# Patient Record
Sex: Female | Born: 1945 | Race: White | Hispanic: No | Marital: Married | Smoking: Former smoker
Health system: Southern US, Community
[De-identification: ages and names within clinical notes are randomized; demographics above are authoritative.]

## PROBLEM LIST (undated history)

## (undated) DIAGNOSIS — M797 Fibromyalgia: Secondary | ICD-10-CM

## (undated) DIAGNOSIS — I272 Pulmonary hypertension, unspecified: Secondary | ICD-10-CM

## (undated) DIAGNOSIS — N189 Chronic kidney disease, unspecified: Secondary | ICD-10-CM

## (undated) DIAGNOSIS — K219 Gastro-esophageal reflux disease without esophagitis: Secondary | ICD-10-CM

## (undated) DIAGNOSIS — C539 Malignant neoplasm of cervix uteri, unspecified: Secondary | ICD-10-CM

## (undated) DIAGNOSIS — M7070 Other bursitis of hip, unspecified hip: Secondary | ICD-10-CM

## (undated) DIAGNOSIS — F419 Anxiety disorder, unspecified: Secondary | ICD-10-CM

## (undated) DIAGNOSIS — F32A Depression, unspecified: Secondary | ICD-10-CM

## (undated) DIAGNOSIS — E7800 Pure hypercholesterolemia, unspecified: Secondary | ICD-10-CM

## (undated) DIAGNOSIS — F410 Panic disorder [episodic paroxysmal anxiety] without agoraphobia: Secondary | ICD-10-CM

## (undated) DIAGNOSIS — M8580 Other specified disorders of bone density and structure, unspecified site: Secondary | ICD-10-CM

## (undated) DIAGNOSIS — Z8719 Personal history of other diseases of the digestive system: Secondary | ICD-10-CM

## (undated) DIAGNOSIS — Z8619 Personal history of other infectious and parasitic diseases: Secondary | ICD-10-CM

## (undated) DIAGNOSIS — T7840XA Allergy, unspecified, initial encounter: Secondary | ICD-10-CM

## (undated) DIAGNOSIS — R911 Solitary pulmonary nodule: Secondary | ICD-10-CM

## (undated) DIAGNOSIS — F329 Major depressive disorder, single episode, unspecified: Secondary | ICD-10-CM

## (undated) DIAGNOSIS — R002 Palpitations: Secondary | ICD-10-CM

## (undated) DIAGNOSIS — I499 Cardiac arrhythmia, unspecified: Secondary | ICD-10-CM

## (undated) HISTORY — DX: Panic disorder (episodic paroxysmal anxiety): F41.0

## (undated) HISTORY — DX: Anxiety disorder, unspecified: F41.9

## (undated) HISTORY — DX: Pure hypercholesterolemia, unspecified: E78.00

## (undated) HISTORY — DX: Personal history of other infectious and parasitic diseases: Z86.19

## (undated) HISTORY — DX: Solitary pulmonary nodule: R91.1

## (undated) HISTORY — DX: Gastro-esophageal reflux disease without esophagitis: K21.9

## (undated) HISTORY — PX: CARDIAC CATHETERIZATION: SHX172

## (undated) HISTORY — DX: Pulmonary hypertension, unspecified: I27.20

## (undated) HISTORY — DX: Other specified disorders of bone density and structure, unspecified site: M85.80

## (undated) HISTORY — DX: Fibromyalgia: M79.7

## (undated) HISTORY — DX: Allergy, unspecified, initial encounter: T78.40XA

## (undated) HISTORY — DX: Malignant neoplasm of cervix uteri, unspecified: C53.9

## (undated) HISTORY — DX: Major depressive disorder, single episode, unspecified: F32.9

## (undated) HISTORY — DX: Other bursitis of hip, unspecified hip: M70.70

## (undated) HISTORY — DX: Palpitations: R00.2

## (undated) HISTORY — PX: THYROGLOSSAL DUCT CYST: SHX297

## (undated) HISTORY — DX: Depression, unspecified: F32.A

---

## 1976-12-08 DIAGNOSIS — C539 Malignant neoplasm of cervix uteri, unspecified: Secondary | ICD-10-CM

## 1976-12-08 HISTORY — PX: ABDOMINAL HYSTERECTOMY: SHX81

## 1976-12-08 HISTORY — DX: Malignant neoplasm of cervix uteri, unspecified: C53.9

## 2000-12-08 HISTORY — PX: COLONOSCOPY: SHX174

## 2003-12-09 HISTORY — PX: HAND SURGERY: SHX662

## 2005-12-08 HISTORY — PX: NASAL HEMORRHAGE CONTROL: SHX287

## 2006-12-08 HISTORY — PX: SHOULDER SURGERY: SHX246

## 2008-09-06 ENCOUNTER — Encounter: Payer: Self-pay | Admitting: Pulmonary Disease

## 2008-09-06 ENCOUNTER — Encounter: Payer: Self-pay | Admitting: Family Medicine

## 2008-09-25 ENCOUNTER — Encounter: Payer: Self-pay | Admitting: Pulmonary Disease

## 2009-02-22 ENCOUNTER — Ambulatory Visit: Payer: Self-pay | Admitting: Family Medicine

## 2009-02-22 DIAGNOSIS — E7800 Pure hypercholesterolemia, unspecified: Secondary | ICD-10-CM

## 2009-02-22 DIAGNOSIS — J309 Allergic rhinitis, unspecified: Secondary | ICD-10-CM | POA: Insufficient documentation

## 2009-02-22 DIAGNOSIS — M8580 Other specified disorders of bone density and structure, unspecified site: Secondary | ICD-10-CM

## 2009-02-22 DIAGNOSIS — N951 Menopausal and female climacteric states: Secondary | ICD-10-CM

## 2009-02-22 DIAGNOSIS — F4323 Adjustment disorder with mixed anxiety and depressed mood: Secondary | ICD-10-CM | POA: Insufficient documentation

## 2009-02-22 DIAGNOSIS — E559 Vitamin D deficiency, unspecified: Secondary | ICD-10-CM

## 2009-02-22 LAB — HM COLONOSCOPY

## 2009-03-08 LAB — CONVERTED CEMR LAB: Pap Smear: NORMAL

## 2009-03-15 ENCOUNTER — Ambulatory Visit: Payer: Self-pay | Admitting: Obstetrics & Gynecology

## 2009-03-15 ENCOUNTER — Encounter: Payer: Self-pay | Admitting: Obstetrics & Gynecology

## 2009-04-13 ENCOUNTER — Encounter: Admission: RE | Admit: 2009-04-13 | Discharge: 2009-04-13 | Payer: Self-pay | Admitting: Family Medicine

## 2009-04-17 ENCOUNTER — Encounter (INDEPENDENT_AMBULATORY_CARE_PROVIDER_SITE_OTHER): Payer: Self-pay | Admitting: *Deleted

## 2009-04-20 ENCOUNTER — Ambulatory Visit: Payer: Self-pay | Admitting: Family Medicine

## 2009-04-24 LAB — CONVERTED CEMR LAB
ALT: 21 units/L (ref 0–35)
AST: 26 units/L (ref 0–37)
CO2: 31 meq/L (ref 19–32)
Chloride: 107 meq/L (ref 96–112)
Creatinine, Ser: 0.9 mg/dL (ref 0.4–1.2)
Potassium: 4.5 meq/L (ref 3.5–5.1)
Sodium: 142 meq/L (ref 135–145)
Total CHOL/HDL Ratio: 3
Vit D, 25-Hydroxy: 42 ng/mL (ref 30–89)

## 2009-06-25 ENCOUNTER — Ambulatory Visit: Payer: Self-pay | Admitting: Family Medicine

## 2009-08-16 ENCOUNTER — Encounter: Payer: Self-pay | Admitting: Family Medicine

## 2009-08-17 ENCOUNTER — Ambulatory Visit: Payer: Self-pay | Admitting: Family Medicine

## 2009-08-17 DIAGNOSIS — R002 Palpitations: Secondary | ICD-10-CM

## 2009-08-21 ENCOUNTER — Ambulatory Visit: Payer: Self-pay | Admitting: Cardiology

## 2009-08-23 LAB — CONVERTED CEMR LAB: TSH: 1.131 microintl units/mL (ref 0.350–4.500)

## 2009-08-28 ENCOUNTER — Telehealth: Payer: Self-pay | Admitting: Family Medicine

## 2009-09-04 ENCOUNTER — Encounter: Payer: Self-pay | Admitting: Cardiology

## 2009-09-04 ENCOUNTER — Ambulatory Visit: Payer: Self-pay

## 2009-09-14 ENCOUNTER — Ambulatory Visit: Payer: Self-pay | Admitting: Pulmonary Disease

## 2009-09-14 DIAGNOSIS — J984 Other disorders of lung: Secondary | ICD-10-CM

## 2009-09-18 ENCOUNTER — Ambulatory Visit: Payer: Self-pay | Admitting: Cardiology

## 2009-09-20 ENCOUNTER — Telehealth: Payer: Self-pay | Admitting: Family Medicine

## 2009-10-18 ENCOUNTER — Telehealth (INDEPENDENT_AMBULATORY_CARE_PROVIDER_SITE_OTHER): Payer: Self-pay | Admitting: Internal Medicine

## 2009-10-18 ENCOUNTER — Ambulatory Visit: Payer: Self-pay | Admitting: Family Medicine

## 2010-03-04 ENCOUNTER — Telehealth: Payer: Self-pay | Admitting: Pulmonary Disease

## 2010-03-12 ENCOUNTER — Ambulatory Visit: Payer: Self-pay | Admitting: Internal Medicine

## 2010-03-15 ENCOUNTER — Telehealth (INDEPENDENT_AMBULATORY_CARE_PROVIDER_SITE_OTHER): Payer: Self-pay | Admitting: *Deleted

## 2010-04-03 ENCOUNTER — Telehealth: Payer: Self-pay | Admitting: Family Medicine

## 2010-04-04 ENCOUNTER — Telehealth: Payer: Self-pay | Admitting: Family Medicine

## 2010-04-22 ENCOUNTER — Ambulatory Visit: Payer: Self-pay | Admitting: Family Medicine

## 2010-04-22 DIAGNOSIS — R5383 Other fatigue: Secondary | ICD-10-CM

## 2010-04-22 DIAGNOSIS — R21 Rash and other nonspecific skin eruption: Secondary | ICD-10-CM | POA: Insufficient documentation

## 2010-04-22 DIAGNOSIS — R5381 Other malaise: Secondary | ICD-10-CM

## 2010-04-23 LAB — CONVERTED CEMR LAB
ALT: 16 units/L (ref 0–35)
AST: 20 units/L (ref 0–37)
VLDL: 15.4 mg/dL (ref 0.0–40.0)
Vit D, 25-Hydroxy: 41 ng/mL (ref 30–89)

## 2010-05-02 ENCOUNTER — Ambulatory Visit: Payer: Self-pay | Admitting: Obstetrics & Gynecology

## 2010-05-29 ENCOUNTER — Encounter: Admission: RE | Admit: 2010-05-29 | Discharge: 2010-05-29 | Payer: Self-pay | Admitting: Obstetrics & Gynecology

## 2010-05-29 LAB — HM MAMMOGRAPHY: HM Mammogram: NEGATIVE

## 2010-07-03 ENCOUNTER — Telehealth: Payer: Self-pay | Admitting: Family Medicine

## 2010-07-30 ENCOUNTER — Telehealth: Payer: Self-pay | Admitting: Family Medicine

## 2010-09-04 ENCOUNTER — Ambulatory Visit: Payer: Self-pay | Admitting: Family Medicine

## 2010-09-04 DIAGNOSIS — M5412 Radiculopathy, cervical region: Secondary | ICD-10-CM | POA: Insufficient documentation

## 2010-09-09 ENCOUNTER — Telehealth: Payer: Self-pay | Admitting: Family Medicine

## 2010-12-29 ENCOUNTER — Encounter: Payer: Self-pay | Admitting: Obstetrics & Gynecology

## 2011-01-07 NOTE — Progress Notes (Signed)
Summary: ? to refill Prilosec 40mg   Phone Note Refill Request Call back at [phone] Message from:  [facility] [address] on April 03, 2010 10:25 AM  Refills Requested: Medication #1:  PRILOSEC 40 MG CPDR 1 by mouth once daily   Last Refilled: 03/03/2010 [facility] [address] request refill on Prilosec 40mg . On 08/17/09 and 08/28/09 pt was taking Prilosec 20mg  and questioning cough. On 09/2009 note increased to Prilosec 40 mg. Is it ok to refill.Please advise.    Method Requested: Electronic Initial call taken by: Lewanda Rife LPN,  April 03, 2010 10:27 AM  Follow-up for Phone Call        ok to change to 40  f/u with me this summer  update earlier if not imp Follow-up by: Judith Part MD,  April 03, 2010 10:52 AM  Additional Follow-up for Phone Call Additional follow up Details #1::        Medication phoned to [facility] [address] pharmacy as instructed. Pt scheduled appt with Dr Milinda Antis on 04/22/10 at 8:45am.[facility] LPN  April 03, 2010 11:03 AM     Prescriptions: PRILOSEC 40 MG CPDR (OMEPRAZOLE) 1 by mouth once daily  #30 x 11   Entered and Authorized by:   Judith Part MD   Signed by:   Lewanda Rife LPN on 86/57/8469   Method used:   Telephoned to ...       [facility] [address]ail)       [address]       [place], [place]  [address]       Ph: [phone]       Fax: [phone]   RxID:   [id number]

## 2011-01-07 NOTE — Assessment & Plan Note (Signed)
Summary: FOLLOW UP APPT AND LABS/RI   Vital Signs:  Patient profile:   65 year old female Height:      64 inches Weight:      161.75 pounds BMI:     27.86 Temp:     98.1 degrees F oral Pulse rate:   80 / minute Pulse rhythm:   regular BP sitting:   118 / 76  (left arm) Cuff size:   regular  Vitals Entered By: Lewanda Rife LPN (Apr 22, 2010 8:52 AM) CC: follow up and get labs   History of Present Illness: here for f/u of lipids/ vit D def / panic disorder  has been feeling good lately  some allergy issues - takes the zytrec  needs to be better with her nasonex   is generally tired - wants to check B12 level  no regular exercise   bp is good 118/76 today   last vit d in 40s taking it on and off -- wants to check the level  just did make a pill box   anxiety-- that is a lot better overall   wt is down 1lb   pulm nodule being followed by her pulm-- just went back for the CT , and it has not changed  will not need another for 18 mo   sees gyn  has had a rash on chest and back and occ arms for 4 mo  new water filter system helps  not bad  does not put anything on it  switches soap brands   Allergies: 1)  ! Augmentin  Past History:  Past Surgical History: Last updated: 09/14/2009 Hysterectomy 1978 R Shoulder bone spur (2008) Nose- Nosebleeds (2007) L Hand Thrombosis (2005) nl colonosc in 2002   Family History: Last updated: 09/14/2009 Family History Diabetes (other relative) Family History High cholesterol (parent, other relative) Family History Hypertension (parent) Family History  Glaucoma Family History Breast cancer 1st degree relative (other relative)- sister  Mother with MI at 65, sister with atrial fibrillation   emphysema: mother, sister allergies: mother, father, siblings asthma: mother, sisters heart disease: sister rheumatism: sister cancer: sister (lung) sister (breast)   Social History: Last updated: 09/14/2009 Retired- from book  keeping  married (husband is colon cancer survivor) Former Smoker- started at age 36.  1/2 to 1 ppd.  quit 1980 Alcohol use-no Drug use-no Regular exercise-no G4P1  Risk Factors: Exercise: no (02/22/2009)  Risk Factors: Smoking Status: quit (02/22/2009)  Past Medical History: 1. Palpitations 2. High Cholesterol 3. Panic Disorder 4. Depression 5. Cervical cancer 6. osteopenia  7. vit D def  8. C difficile in past from augmentin ? of copd on cxr in 9/09- then nl pfts with no obst or restrictive dz noted  pulmonary nodule - being observed   gyn- Dr Marice Potter  pulm   Review of Systems General:  Complains of fatigue; denies fever, loss of appetite, malaise, and sleep disorder. Eyes:  Denies blurring and eye irritation. CV:  Denies chest pain or discomfort, lightheadness, palpitations, and shortness of breath with exertion. Resp:  Denies cough and wheezing. GI:  Denies abdominal pain, bloody stools, and change in bowel habits. GU:  Denies abnormal vaginal bleeding, discharge, dysuria, and urinary frequency. MS:  Denies muscle aches and cramps. Derm:  Complains of itching and rash; denies poor wound healing. Neuro:  Denies numbness and tingling. Psych:  Denies panic attacks and suicidal thoughts/plans; anxiety is generally imp. Endo:  Denies cold intolerance, excessive thirst, excessive urination, and heat  intolerance. Heme:  Denies abnormal bruising and bleeding.  Physical Exam  General:  Well-developed,well-nourished,in no acute distress; alert,appropriate and cooperative throughout examination Head:  normocephalic, atraumatic, and no abnormalities observed.   Eyes:  vision grossly intact, pupils equal, pupils round, and pupils reactive to light.  no conjunctival pallor, injection or icterus  Ears:  R ear normal and L ear normal.   Neck:  supple with full rom and no masses or thyromegally, no JVD or carotid bruit  Lungs:  Normal respiratory effort, chest expands symmetrically.  Lungs are clear to auscultation, no crackles or wheezes. Heart:  Normal rate and regular rhythm. S1 and S2 normal without gallop, murmur, click, rub or other extra sounds. Abdomen:  soft, non-tender, and normal bowel sounds.   Msk:  No deformity or scoliosis noted of thoracic or lumbar spine.  no acute joint changes  no kyphosis  Pulses:  R and L carotid,radial,femoral,dorsalis pedis and posterior tibial pulses are full and equal bilaterally Extremities:  No clubbing, cyanosis, edema, or deformity noted with normal full range of motion of all joints.   Neurologic:  sensation intact to light touch, sensation intact to pinprick, gait normal, and DTRs symmetrical and normal.   Skin:  much solar damage with lentigos and few AKs areas of itch on chest and back -- some raised texture without redness or visible rash Cervical Nodes:  No lymphadenopathy noted Inguinal Nodes:  No significant adenopathy Psych:  normal affect, talkative and pleasant    Impression & Recommendations:  Problem # 1:  SKIN RASH (ICD-782.1) Assessment New much itch over chest and back- not much rash appreciated- ? if rel to sun damage adv to stop all fragrances/colors in products-- see inst ref to derm  rev need for sun prot Orders: Dermatology Referral (Derma)  Problem # 2:  FATIGUE (ICD-780.79) Assessment: New more tired today despite good balanced diet  pt req B12 check- will do that today recommended regular aerobic exercise Orders: Venipuncture (95638) TLB-B12 + Folate Pnl (82746_82607-B12/FOL)  Problem # 3:  OSTEOPENIA (ICD-733.90) Assessment: Unchanged will f/u with gyn vit D level today -- urged imp of compliance today -- copy to Dr Marice Potter Her updated medication list for this problem includes:    Vitamin D 1000 Unit Tabs (Cholecalciferol) .Marland Kitchen... Take 1 tablet by mouth once a day    Caltrate 600+d 600-400 Mg-unit Tabs (Calcium carbonate-vitamin d) .Marland Kitchen... Take 1 tablet by mouth once a  day  Orders: T-Vitamin D (25-Hydroxy) (75643-32951) Specimen Handling (88416)  Problem # 4:  HYPERCHOLESTEROLEMIA (ICD-272.0) Assessment: Unchanged  has been fairly controlled with statin and diet rev low sat fat diet  lab today  f/u 6 mo  Her updated medication list for this problem includes:    Zocor 10 Mg Tabs (Simvastatin) .Marland Kitchen... Take 1 tablet by mouth once a day  Orders: Venipuncture (60630) TLB-Lipid Panel (80061-LIPID) TLB-ALT (SGPT) (84460-ALT) TLB-AST (SGOT) (84450-SGOT) TLB-B12 + Folate Pnl (16010_93235-T73/UKG)  Labs Reviewed: SGOT: 26 (04/20/2009)   SGPT: 21 (04/20/2009)   HDL:61.50 (04/20/2009)  LDL:106 (04/20/2009)  Chol:182 (04/20/2009)  Trig:72.0 (04/20/2009)  Problem # 5:  DEPRESSION (ICD-311) Assessment: Improved this and anx are overall better with less stress  Her updated medication list for this problem includes:    Trazodone Hcl 50 Mg Tabs (Trazodone hcl) .Marland Kitchen... Take 1 tablet by mouth once a day    Zoloft 50 Mg Tabs (Sertraline hcl) .Marland Kitchen... Take 1 tablet by mouth once a day  Complete Medication List: 1)  Trazodone Hcl 50 Mg  Tabs (Trazodone hcl) .... Take 1 tablet by mouth once a day 2)  Zoloft 50 Mg Tabs (Sertraline hcl) .... Take 1 tablet by mouth once a day 3)  Zocor 10 Mg Tabs (Simvastatin) .... Take 1 tablet by mouth once a day 4)  Zyrtec Allergy 10 Mg Tabs (Cetirizine hcl) .... Take 1 tablet by mouth once a day 5)  Vitamin D 1000 Unit Tabs (Cholecalciferol) .... Take 1 tablet by mouth once a day 6)  Caltrate 600+d 600-400 Mg-unit Tabs (Calcium carbonate-vitamin d) .... Take 1 tablet by mouth once a day 7)  Fish Oil 1200 Mg Caps (Omega-3 fatty acids) .... Two tabs by mouth daily 8)  Multivitamins Tabs (Multiple vitamin) .... Take 1 tablet by mouth once a day 9)  Femring 0.05 Mg/24hr Ring (Estradiol acetate) .... As needed 10)  Nasonex 50 Mcg/act Susp (Mometasone furoate) .... 2 spray in each nostril once daily as needed 11)  Prilosec 40 Mg Cpdr  (Omeprazole) .Marland Kitchen.. 1 by mouth once daily  Patient Instructions: 1)  use cetaphil or dove soap for sensitive skin  2)  avoid all fragrances and colors in products  3)  stop fabric softener  4)  use a detergent with the word free in it 5)  we will do derm referral at check out  6)  use your sunscreen  7)  lab today  8)  start a regular exercise program for fatigue  9)  follow up in 6 mo for PE -- labs 1 week before   Current Allergies (reviewed today): ! AUGMENTIN

## 2011-01-07 NOTE — Progress Notes (Signed)
Summary: Sertraline 50mg  rx  Phone Note Refill Request Call back at [phone] Message from:  [facility] on July 03, 2010 10:23 AM  Refills Requested: Medication #1:  ZOLOFT 50 MG TABS Take 1 tablet by mouth once a day   Last Refilled: 04/04/2010 [facility] [address] electronically request refill on Sertraline 50mg  quanity #90. Last refilled 04/04/10.Please advise.    Method Requested: Telephone to Pharmacy Initial call taken by: Lewanda Rife LPN,  July 03, 2010 10:24 AM  Follow-up for Phone Call        px written on EMR for call in  Follow-up by: Judith Part MD,  July 03, 2010 10:31 AM  Additional Follow-up for Phone Call Additional follow up Details #1::        Medication phoned to [facility] [address] pharmacy as instructed. Lewanda Rife LPN  July 03, 2010 11:17 AM     Prescriptions: ZOLOFT 50 MG TABS (SERTRALINE HCL) Take 1 tablet by mouth once a day  #90 x 3   Entered and Authorized by:   Judith Part MD   Signed by:   Lewanda Rife LPN on 45/40/9811   Method used:   Telephoned to ...       [facility] [address]ail[facility]       [address], [address]       [place], [place]  [address]       Ph: [phone]       Fax: [phone]   RxID:   [id number]

## 2011-01-07 NOTE — Progress Notes (Signed)
Summary: Trazadone refill request  Phone Note Refill Request Call back at [phone] [facility]) Message from:  [facility] [address] on July 30, 2010 8:10 AM  Refills Requested: Medication #1:  TRAZODONE HCL 50 MG TABS Take 1 tablet by mouth once a day   Last Refilled: 05/01/2010 Received electronic request for refill for Trazadone 100mg  1 by mouth once daily. Ok to refill?   Method Requested: Electronic Initial call taken by: Janee Morn CMA Duncan Dull),  July 30, 2010 8:11 AM  Follow-up for Phone Call        px written on EMR for call in  Follow-up by: Judith Part MD,  July 30, 2010 9:55 AM  Additional Follow-up for Phone Call Additional follow up Details #1::        Rx called to pharmacy. Additional Follow-up by: Sydell Axon LPN,  July 30, 2010 1:13 PM    Prescriptions: TRAZODONE HCL 50 MG TABS (TRAZODONE HCL) Take 1 tablet by mouth once a day  #30 x 11   Entered and Authorized by:   Judith Part MD   Signed by:   Judith Part MD on 07/30/2010   Method used:   Telephoned to ...       [facility] [address]       [address]       [place], [place]  [address]       Ph: [phone]       Fax: [phone]   RxID:   [id number]

## 2011-01-07 NOTE — Assessment & Plan Note (Signed)
Summary: PULLED MUSCLE IN NECK   Vital Signs:  Patient profile:   65 year old female Height:      64 inches Weight:      160.25 pounds BMI:     27.61 Temp:     98.7 degrees F oral Pulse rate:   77 / minute Pulse rhythm:   regular BP sitting:   110 / 72  (left arm) Cuff size:   regular  Vitals Entered ByMelody Comas (September 04, 2010 3:07 PM) CC: pulled muscle in neck   History of Present Illness: Was doing housework on Monday and pain started Monday night.  Pain in upper back on R side.  H/o herniated disk per patient in C spine.  Pain down to R hand and 3rd/4th fingers.  Zara Wendt pain.  No numbness in hand that is new- prev/consistently numb on 2nd finger.  No trauma.  No change in grip.  Grip changed from pain.  Pain working above her head.  No h/o C spine surgery.  Prev used traction and physical therapy.    Going to [place] to be with sister who has stage IV lung CA.   Allergies: 1)  ! Augmentin  Review of Systems       See HPI.  Otherwise negative.    Physical Exam  General:  NAD, A&O but uncomfortable.  Sitting with R arm straight for comfort, frequently shifting during the exam for comfort.  MMM Neck w/o midline pain.  ROM intact but influenced by pain. RRR CTAB R upper back tender to palpation over superior portion of scap.  ROM at R shoulder intact but influenced by pain. Normal ROM at the elbow.  able to make composite fist but decrease in sensation noted- baseline changes on 2nd digit and now with paresthesia on 3rd digit.  DTRs wnl bilaterally.   Impression & Recommendations:  Problem # 1:  CERVICAL RADICULOPATHY, RIGHT (ICD-723.4) Refer to physical therapy if not improving with meds.  Can't use physical therapy now due to sister's illness and travel for this.  use pain meds below with routine cautions, esp GI caution on steroids.  No indication to image today as this wouldn't change mgmt.  D/w patient and she agrees.  follow up as needed.   Complete  Medication List: 1)  Trazodone Hcl 50 Mg Tabs (Trazodone hcl) .... Take 1 tablet by mouth once a day 2)  Zoloft 50 Mg Tabs (Sertraline hcl) .... Take 1 tablet by mouth once a day 3)  Zocor 10 Mg Tabs (Simvastatin) .... Take 1 tablet by mouth once a day 4)  Zyrtec Allergy 10 Mg Tabs (Cetirizine hcl) .... Take 1 tablet by mouth once a day 5)  Vitamin D 1000 Unit Tabs (Cholecalciferol) .... Take 1 tablet by mouth once a day 6)  Caltrate 600+d 600-400 Mg-unit Tabs (Calcium carbonate-vitamin d) .... Take 1 tablet by mouth once a day 7)  Fish Oil 1200 Mg Caps (Omega-3 fatty acids) .... Two tabs by mouth daily 8)  Multivitamins Tabs (Multiple vitamin) .... Take 1 tablet by mouth once a day 9)  Femring 0.05 Mg/24hr Ring (Estradiol acetate) .... As needed 10)  Nasonex 50 Mcg/act Susp (Mometasone furoate) .... 2 spray in each nostril once daily as needed 11)  Prilosec 40 Mg Cpdr (Omeprazole) .Marland Kitchen.. 1 by mouth once daily 12)  Vicodin 5-500 Mg Tabs (Hydrocodone-acetaminophen) .Marland Kitchen.. 1-2 by mouth three times a day as needed for pain with sedation caution 13)  Flexeril 10 Mg Tabs (Cyclobenzaprine  hcl) .... 0.5-1 tab by mouth three times a day as needed for muscle spasm with sedation caution 14)  Prednisone (pak) 5 Mg Tabs (Prednisone) .... Take as directed with food, 12 day dose pack  Other Orders: Admin 1st Vaccine (95638) Flu Vaccine 3yrs + (75643)  Patient Instructions: 1)  Call back if you would like to set up physical therapy.  Use the meds in the meantime.  Flexeril and vicodin can make you drowsy.  Take the prednisone with food to proctect your stomach. 2)  Take care.  Glad to see you today.  Prescriptions: PREDNISONE (PAK) 5 MG TABS (PREDNISONE) take as directed with food, 12 day dose pack  #1 pack x 0   Entered and Authorized by:   Crawford Givens MD   Signed by:   Crawford Givens MD on 09/04/2010   Method used:   Print then Give to Patient   RxID:   [id number] FLEXERIL 10 MG TABS  (CYCLOBENZAPRINE HCL) 0.5-1 tab by mouth three times a day as needed for muscle spasm with sedation caution  #30 x 1   Entered and Authorized by:   Crawford Givens MD   Signed by:   Crawford Givens MD on 09/04/2010   Method used:   Print then Give to Patient   RxID:   [id number] VICODIN 5-500 MG TABS (HYDROCODONE-ACETAMINOPHEN) 1-2 by mouth three times a day as needed for pain with sedation caution  #40 x 1   Entered and Authorized by:   Crawford Givens MD   Signed by:   Crawford Givens MD on 09/04/2010   Method used:   Print then Give to Patient   RxID:   [id number]   Current Allergies (reviewed today): ! AUGMENTIN  Flu Vaccine Consent Questions     Do you have a history of severe allergic reactions to this vaccine? no    Any prior history of allergic reactions to egg and/or gelatin? no    Do you have a sensitivity to the preservative Thimersol? no    Do you have a past history of Guillan-Barre Syndrome? no    Do you currently have an acute febrile illness? no    Have you ever had a severe reaction to latex? no    Vaccine information given and explained to patient? yes    Are you currently pregnant? no    Lot Number:[id number]   Exp Date:06/07/2011   Site Given  Left Deltoid IMlbflu Lugene Fuquay CMA (AAMA)  September 04, 2010 4:40 PM

## 2011-01-07 NOTE — Progress Notes (Signed)
Summary: wants physical therapy  Phone Note Call from Patient Call back at Home Phone [phone]   Caller: Patient Call For: Dr. Para March Summary of Call: Pt was seen last week for neck and arm pain.  The medicine is helping with the pain but she would like to get physical therapy.  She prefers to go to [place]. Initial call taken by: Lowella Petties CMA,  September 09, 2010 8:08 AM  Follow-up for Phone Call        referral ordered.  Follow-up by: Crawford Givens MD,  September 09, 2010 1:47 PM

## 2011-01-07 NOTE — Progress Notes (Signed)
Summary: returning call  Phone Note Call from Patient Call back at Home Phone [phone]   Caller: Patient Call For: clance Summary of Call: Returning Megan's call. Initial call taken by: Darletta Moll,  March 15, 2010 8:36 AM  Follow-up for Phone Call        Pt informed of CT Chest results per CT append by [facility] dated 03/13/10.  She verbalized understanding and aware to call office back in Oct 2012 to schedule f/u CT.  Gweneth Dimitri RN  March 15, 2010 9:32 AM

## 2011-01-07 NOTE — Progress Notes (Signed)
Summary: set up CT  Phone Note Call from Patient Call back at Home Phone [phone]   Caller: Patient Call For: clance Summary of Call: pt states she's to have a CT (it's been 6 months since her last CT). Initial call taken by: Tivis Ringer, CNA,  March 04, 2010 9:06 AM  Follow-up for Phone Call        Per append to 10/10 ct, you wanted repeat ct chest in april 2011. please verify that you want CT chest without contrast ordered . thanks. Carron Curie CMA  March 04, 2010 9:31 AM   Additional Follow-up for Phone Call Additional follow up Details #1::        reviewed record.  due for ct chest to f/u nodule

## 2011-01-07 NOTE — Progress Notes (Signed)
Summary: Zoloft 50mg  refill  Phone Note Refill Request Call back at [phone] Message from:  [facility] [address] on April 04, 2010 9:49 AM  Refills Requested: Medication #1:  ZOLOFT 50 MG TABS Take 1 tablet by mouth once a day   Last Refilled: 01/09/2010 [facility] [address] request refill Zoloft 50mg .Please advise.    Method Requested: Telephone to Pharmacy Initial call taken by: Lewanda Rife LPN,  April 04, 2010 9:49 AM  Follow-up for Phone Call        px written on EMR for call in  Follow-up by: Judith Part MD,  April 04, 2010 1:01 PM  Additional Follow-up for Phone Call Additional follow up Details #1::        Medication phoned to [facility] [address] pharmacy as instructed. Lewanda Rife LPN  April 04, 2010 1:05 PM     New/Updated Medications: ZOLOFT 50 MG TABS (SERTRALINE HCL) Take 1 tablet by mouth once a day Prescriptions: ZOLOFT 50 MG TABS (SERTRALINE HCL) Take 1 tablet by mouth once a day  #90 x 0   Entered and Authorized by:   Judith Part MD   Signed by:   Lewanda Rife LPN on 62/95/2841   Method used:   Telephoned to ...       [facility] [address]       [address]       [place], [place]  [address]       Ph: [phone]       Fax: [phone]   RxID:   [id number]

## 2011-04-14 ENCOUNTER — Other Ambulatory Visit: Payer: Self-pay | Admitting: *Deleted

## 2011-04-14 MED ORDER — MOMETASONE FUROATE 50 MCG/ACT NA SUSP: NASAL | Status: DC

## 2011-04-14 NOTE — Telephone Encounter (Signed)
My computer is broken -- I cannot print px at this time.. So I have written one on px pad  In IN box

## 2011-04-14 NOTE — Telephone Encounter (Signed)
Patient is asking for written rx to come by and pick up.

## 2011-04-14 NOTE — Telephone Encounter (Signed)
Patient notified as instructed by telephone. Prescription left at front desk.  

## 2011-04-14 NOTE — Telephone Encounter (Signed)
Px printed for pick up in IN box  

## 2011-04-22 NOTE — Assessment & Plan Note (Signed)
Mary Gordon, BARBEE NO.:  [id number]   MEDICAL RECORD NO.:  [id number]          PATIENT TYPE:  POB   LOCATION:  [facility] at [facility]         FACILITY:  [facility]   PHYSICIAN:  Allie Bossier, MD        DATE OF BIRTH:  [birth year]   DATE OF SERVICE:  03/15/2009                                  CLINIC NOTE   Ms. Treloar is a 65 year old married white gravida 4, para 1, AB 3, who is  here for her annual exam.  This is her first visit at this office since  she just moved here from [place], [place] 3 months ago.  She comes  in today with requesting a refill for her Femring.  She is very happy  with this form of estrogen replacement.   PAST MEDICAL HISTORY:  Significant for cervical cancer, although her  treatment was merely a vaginal hysterectomy with no followup radiation  or chemotherapy.  Her Pap smears have always been normal since then.   OTHER PAST MEDICAL HISTORY:  Osteopenia, history of high cholesterol,  anxiety and panic disorder.   REVIEW OF SYSTEMS:  Married for 25 years.  She is sexually active.  She  is retired.  Her Pap smear was last done for April 2009.  Mammogram was  last done April 2009 and is already scheduled for this month.  A  colonoscopy was done in 2002 and was reportedly normal and her bone  densitometry or DEXA scan was done in 2008 and she wants to have a next  one next year.   PAST SURGICAL HISTORY:  She had a left shoulder bone spur removed, left-  hand thrombus removed, vaginal hysterectomy in 1978, and nasal surgery  for a growth in her nasal cavity.   ALLERGIES:  No latex allergies.   MEDICATIONS:  She takes Zocor, Zoloft, trazodone, Zyrtec, Nasonex, and  Femring 0.05 mg monthly.  She takes calcium with vitamin D on a  plus/minus basis.   FAMILY HISTORY:  Significant for breast cancer diagnosed in her sister  at age 65.   PHYSICAL EXAMINATION:  VITAL SIGNS:  Weight 152 pounds, blood pressure  196/61, pulse 68.  HEENT:  Normal.  HEART:  Regular rate and rhythm.  LUNGS:  Clear to auscultation bilaterally.  BREASTS:  Normal bilaterally.  ABDOMEN:  Scaphoid.  No hepatosplenomegaly.  EXTERNAL GENITALIA:  No lesions.  Vaginal cuff, no lesions.  There is  normal mucosa throughout.  A Femring is present in the vault.  Bimanual  exam reveals no masses and is nontender.   ASSESSMENT AND PLAN:  Annual exam.  I have checked Pap smear due to her  history of cervical disease.  Recommended self-breast and self-vulvar  exam.  She will have her mammogram this month.  I have given a refill  for her Femring and I will see her back next year or as necessary  sooner.      Allie Bossier, MD     MCD/MEDQ  D:  03/15/2009  T:  03/15/2009  Job:  [id number]

## 2011-04-22 NOTE — Assessment & Plan Note (Signed)
NAMEULA, COUVILLON NO.:  [id number]   MEDICAL RECORD NO.:  [id number]          PATIENT TYPE:  POB   LOCATION:  [facility] at [facility]         FACILITY:  [facility]   PHYSICIAN:  Allie Bossier, MD        DATE OF BIRTH:  1946/02/04   DATE OF SERVICE:  05/02/2010                                  CLINIC NOTE   Ms. Farino is a 65 year old married white G4, P1, A3 who comes for annual  exam.  She has no particular GYN complaints.   PAST MEDICAL HISTORY:  1. Osteopenia.  2. History of high cholesterol (Dr. Milinda Antis).  3. Anxiety.  4. Panic disorder.  5. History of cervical cancer.   REVIEW OF SYSTEMS:   Dictation ended at this point.      Allie Bossier, MD     MCD/MEDQ  D:  05/02/2010  T:  05/02/2010  Job:  [id number]

## 2011-04-22 NOTE — Assessment & Plan Note (Signed)
Mary Gordon, Mary Gordon NO.:  [id number]   MEDICAL RECORD NO.:  [id number]          PATIENT TYPE:  POB   LOCATION:  [facility] at [facility]         FACILITY:  [facility]   PHYSICIAN:  Allie Bossier, MD        DATE OF BIRTH:  11/17/1946   DATE OF SERVICE:  05/02/2010                                  CLINIC NOTE   HISTORY:  Mary Gordon is a 65 year old married white, G4, P1, A3, who comes  here for annual exam.  She has no GYN complaints.  She wants a refill on  her Femring.   PAST MEDICAL HISTORY:  History of cervical cancer, osteopenia, high  cholesterol (Dr. Milinda Antis), panic and anxiety disorder.   REVIEW OF SYSTEMS:  She is married for 26 years.  She is sexually  active.  She moved from [place], [place] about a year and half ago.  Her colonoscopy due next year.  Her bone density scan due this year and  as will have mammogram.   PAST SURGICAL HISTORY:  Right shoulder bone spur removal, left-hand  thrombus removal, vaginal hysterectomy in 1978, and nasal surgery for a  growth.   ALLERGIES:  No allergies to latex.  No known drug allergies.   FAMILY HISTORY:  Positive for breast cancer and a sister who was  diagnosed at age 46, this sister is alive and well.  Another sister who  lives in [place], was recently diagnosed with lung cancer.  She denies  family history of GYN and colon malignancies.   PHYSICAL EXAMINATION:  GENERAL:  Well-nourished, well-hydrated pleasant  white female.  VITAL SIGNS:  Weight 161 pounds, blood pressure 105/63, and pulse 65.  HEENT:  Normal.  HEART:  Regular rate and rhythm.  LUNGS:  Clear to auscultation bilaterally.  BREASTS:  Normal bilaterally.  ABDOMEN:  Benign.  No palpable hepatosplenomegaly.  EXTERNAL GENITALIA:  Normal.  Normal mucosa throughout.  Bimanual exam,  no masses or tenderness.   ASSESSMENT AND PLAN:  Annual exam.  I am checking a Pap smear since she  has a history of cervical cancer.  Recommend self-breast and  self-vulvar  exams.  Per her request, we are continuing the Femring to be changed  every  3 months.  I am scheduling a bone density and a mammogram.  I have  recommended she have her colonoscopy next year.      Allie Bossier, MD     MCD/MEDQ  D:  05/02/2010  T:  05/02/2010  Job:  [id number]

## 2011-05-01 ENCOUNTER — Other Ambulatory Visit: Payer: Self-pay | Admitting: Family Medicine

## 2011-05-01 NOTE — Telephone Encounter (Signed)
Pt needs to call for check up appt.

## 2011-05-27 ENCOUNTER — Other Ambulatory Visit: Payer: Self-pay | Admitting: Family Medicine

## 2011-05-27 DIAGNOSIS — Z1231 Encounter for screening mammogram for malignant neoplasm of breast: Secondary | ICD-10-CM

## 2011-06-02 ENCOUNTER — Ambulatory Visit
Admission: RE | Admit: 2011-06-02 | Discharge: 2011-06-02 | Disposition: A | Discharge status: Home / Self Care | Payer: BC Managed Care – PPO | Source: Ambulatory Visit | Attending: Family Medicine | Admitting: Family Medicine

## 2011-06-02 DIAGNOSIS — Z1231 Encounter for screening mammogram for malignant neoplasm of breast: Secondary | ICD-10-CM

## 2011-06-06 ENCOUNTER — Encounter: Payer: Self-pay | Admitting: *Deleted

## 2011-06-10 ENCOUNTER — Ambulatory Visit (INDEPENDENT_AMBULATORY_CARE_PROVIDER_SITE_OTHER): Payer: BC Managed Care – PPO | Admitting: Obstetrics & Gynecology

## 2011-06-10 DIAGNOSIS — Z1272 Encounter for screening for malignant neoplasm of vagina: Secondary | ICD-10-CM

## 2011-06-10 DIAGNOSIS — Z01419 Encounter for gynecological examination (general) (routine) without abnormal findings: Secondary | ICD-10-CM

## 2011-06-10 NOTE — Assessment & Plan Note (Signed)
Mary Gordon, DEMURO NO.:  [id number]  MEDICAL RECORD NO.:  [id number]           PATIENT TYPE:  LOCATION:  [facility] at [facility]           FACILITY:  PHYSICIAN:  Allie Bossier, MD        DATE OF BIRTH:  12/05/1946  DATE OF SERVICE:  06/10/2011                                 CLINIC NOTE  Mary Gordon is a 65 year old married white G4, P1, A3.  She has a 59 year old son and she is here for her annual exam.  She has no GYN complaints. She is very happy with the Estring and she would like a refill on that. Her past medical history is significant for "cervical cancer," osteopenia, elevated cholesterol, panic attacks/anxiety disorder (Dr. Milinda Antis).  REVIEW OF SYSTEMS:  She was married in 1984.  She and her husband are sexually active.  She has been the caregiver for her sister for the last 20 months as her sister was dying from lung cancer at age 68.  Her mammogram was done last month and it was normal.  Her bone density was last done in 2011.  PAST SURGICAL HISTORY:  Right shoulder surgery (bone spur), left-hand thrombus removed, a TVH in 1978, nasal surgery for a "growth."  SOCIAL HISTORY:  Negative for tobacco, alcohol, or drug use.  FAMILY HISTORY:  Significant for breast cancer in her sister who was diagnosed at age 41, that sister is currently alive and well.  Another sister recently died of lung cancer at age 55.  She denies family history of GYN or colon malignancies.  No latex allergies.  No known drug allergies.  MEDICATIONS:  Zocor daily, Zoloft daily, trazodone daily nightly, Zyrtec daily, Nasonex on a p.r.n. basis and she uses a Femring and she changes it every 3 months.  She takes calcium with vitamin D.  PHYSICAL EXAMINATION:  GENERAL:  Well-nourished, well-hydrated, very pleasant white female. VITAL SIGNS:  Height 5 feet 4 inches, weight 160 pounds, blood pressure of 95/86, pulse 65. HEENT:  Normal. HEART:  Regular rate and rhythm. LUNGS:  Clear  to auscultation bilaterally. BREAST:  Normal bilaterally. ABDOMEN:  Benign. EXTERNAL GENITALIA:  Normal estrogen effect/no atrophy.  Normal mucosa, vaginal cuff, no lesions.  Bimanual exam reveals no masses.  ASSESSMENT AND PLAN:  Annual exam.  Because of her history of "cervical cancer," I have checked a Pap smear, recommend self-breast and self vulvar exams.  I have refilled her Femring and I will see her in a year or sooner as necessary.     Allie Bossier, MD    MCD/MEDQ  D:  06/10/2011  T:  06/10/2011  Job:  [id number]

## 2011-06-17 ENCOUNTER — Encounter: Payer: Self-pay | Admitting: Cardiology

## 2011-06-18 ENCOUNTER — Encounter: Payer: Self-pay | Admitting: *Deleted

## 2011-06-18 ENCOUNTER — Ambulatory Visit (INDEPENDENT_AMBULATORY_CARE_PROVIDER_SITE_OTHER): Payer: BC Managed Care – PPO | Admitting: Cardiology

## 2011-06-18 ENCOUNTER — Encounter: Payer: Self-pay | Admitting: Cardiology

## 2011-06-18 VITALS — BP 106/72 | HR 75 | Ht 64.0 in | Wt 161.0 lb

## 2011-06-18 DIAGNOSIS — R002 Palpitations: Secondary | ICD-10-CM

## 2011-06-18 DIAGNOSIS — R079 Chest pain, unspecified: Secondary | ICD-10-CM | POA: Insufficient documentation

## 2011-06-18 DIAGNOSIS — E7800 Pure hypercholesterolemia, unspecified: Secondary | ICD-10-CM

## 2011-06-18 NOTE — Assessment & Plan Note (Signed)
Possibly premature beats.  Structurally normal heart on 2010 echo.  Symptoms are bothersome.  She drinks 2 cups coffee/day, so I encouraged her to try to cut back on this.  Will get 48 hour holter monitor to evaluate.

## 2011-06-18 NOTE — Progress Notes (Signed)
PCP: Dr. Milinda Antis  65 yo with history of hyperlipidemia and palpitations presents for cardiology evaluation of chest pain.  Patient noted rather severe central chest pressure/pain when [place] the [place] about 2 weeks ago.  The pain lasted about 2 minutes total then subsided.  Since then, she has had about 6 more episodes of substernal chest pain.  These have lasted several minutes at a time and have been at rest.  Patient also will get milder chest pressure more chronically with walking up a hill.  This will be associated with dyspnea and will recover with rest.  Lesser exertion will not bring on the chest pressure.   Chronically, she has had palpitations.  She will feel her heart flutter and beat irregularly, usually when lying down to sleep at night.  She does not notice them as much during the day but they are bothersome to her at night.  No lightheadedness or syncope.    ECG: NSR, nonspecific T wave flattening.   Allergies:  1)  ! Augmentin  Past Medical History: 1. Palpitations 2. High Cholesterol 3. Panic Disorder 4. Depression 5. Cervical cancer 6. osteopenia  7. vit D def  8. C difficile in past from augmentin 9. Echo (9/10): EF 55-65%, no significant abnormality  Family History: Family History Diabetes (other relative) Family History High cholesterol (parent, other relative) Family History Hypertension (parent) Family History  Glaucoma Family History Breast cancer 1st degree relative (other relative)- sister  Mother with MI at 7, sister with atrial fibrillation  Social History: Retired- from book keeping  married  Former Smoker- quit 35 years ago  (smoked for 16 years) Alcohol use-no Drug use-no Regular exercise-no G4P1  Review of Systems        All systems reviewed and negative except as per HPI.   Current Outpatient Prescriptions  Medication Sig Dispense Refill  . cetirizine (ZYRTEC) 10 MG tablet Take 10 mg by mouth daily.          . Estradiol Acetate (FEMRING) 0.05 MG/24HR RING Place vaginally as needed.        . mometasone (NASONEX) 50 MCG/ACT nasal spray 2 sprays in each nostril once daily as needed.  17 g  11  . Omega-3 Fatty Acids (FISH OIL) 1200 MG CAPS Take 2 capsules by mouth daily.        Marland Kitchen omeprazole (PRILOSEC) 40 MG capsule TAKE ONE CAPSULE BY MOUTH EVERY DAY  30 capsule  3  . sertraline (ZOLOFT) 50 MG tablet Take 50 mg by mouth daily.        . simvastatin (ZOCOR) 10 MG tablet Take 10 mg by mouth at bedtime.        . traZODone (DESYREL) 100 MG tablet Take 1 tablet by mouth At bedtime.        BP 106/72  Pulse 75  Ht 5\' 4"  (1.626 m)  Wt 161 lb (73.029 kg)  BMI 27.64 kg/m2 General: NAD Neck: No JVD, no thyromegaly or thyroid nodule.  Lungs: Clear to auscultation bilaterally with normal respiratory effort. CV: Nondisplaced PMI.  Heart regular S1/S2, no S3/S4, no murmur.  No peripheral edema.  No carotid bruit.  Normal pedal pulses.  Abdomen: Soft, nontender, no hepatosplenomegaly, no distention.  Neurologic: Alert and oriented x 3.  Psych: Normal affect. Extremities: No clubbing or cyanosis.

## 2011-06-18 NOTE — Assessment & Plan Note (Signed)
She is due for a lipid/LFT check.  Will get this on the day she gets her stress test.

## 2011-06-18 NOTE — Assessment & Plan Note (Signed)
Patient has had chest pain with both typical and atypical features.  For a number of months, she has had mild chest pressure with walking up a hill.  2 weeks ago, she had a severe episode of exertional chest pain.  Since then, she has had a number of episodes of chest pain at rest.  ECG has no specific findings.  Main risk factor is hyperlipidemia.  She will start ASA 81 mg daily.  Given her symptoms, I will arrange for her to get an ETT-myoview for risk stratification.

## 2011-06-18 NOTE — Patient Instructions (Signed)
You are scheduled for a Treadmill Myoview. Please refer to instructions given in office today.    THIS TO BE PUT ON IN [place] TOMORROW. (IF NOT DONE PLEASE CALL OUR OFFICE [phone]) Your physician has recommended that you wear a holter monitor. Holter monitors are medical devices that record the heart's electrical activity. Doctors most often use these monitors to diagnose arrhythmias. Arrhythmias are problems with the speed or rhythm of the heartbeat. The monitor is a small, portable device. You can wear one while you do your normal daily activities. This is usually used to diagnose what is causing palpitations/syncope (passing out).  Your physician recommends that you return for a FASTING lipid profile:(lipid/lft/bmp) Tomorrow at your Myoview appt in [facility]  Your physician recommends that you schedule a follow-up appointment in: 2 weeks with Dr. Shirlee Latch

## 2011-06-19 ENCOUNTER — Ambulatory Visit (HOSPITAL_COMMUNITY): Payer: BC Managed Care – PPO | Attending: Cardiology | Admitting: Radiology

## 2011-06-19 ENCOUNTER — Other Ambulatory Visit (INDEPENDENT_AMBULATORY_CARE_PROVIDER_SITE_OTHER): Payer: BC Managed Care – PPO | Admitting: *Deleted

## 2011-06-19 ENCOUNTER — Encounter (INDEPENDENT_AMBULATORY_CARE_PROVIDER_SITE_OTHER): Payer: BC Managed Care – PPO

## 2011-06-19 DIAGNOSIS — R0989 Other specified symptoms and signs involving the circulatory and respiratory systems: Secondary | ICD-10-CM

## 2011-06-19 DIAGNOSIS — R002 Palpitations: Secondary | ICD-10-CM

## 2011-06-19 DIAGNOSIS — R079 Chest pain, unspecified: Secondary | ICD-10-CM

## 2011-06-19 DIAGNOSIS — E7800 Pure hypercholesterolemia, unspecified: Secondary | ICD-10-CM

## 2011-06-19 DIAGNOSIS — R0789 Other chest pain: Secondary | ICD-10-CM

## 2011-06-19 LAB — HEPATIC FUNCTION PANEL
ALT: 21 U/L (ref 0–35)
Total Bilirubin: 0.5 mg/dL (ref 0.3–1.2)

## 2011-06-19 LAB — LIPID PANEL
HDL: 63.9 mg/dL (ref >39.00)
LDL Cholesterol: 119 mg/dL — ABNORMAL HIGH (ref 0–99)
Total CHOL/HDL Ratio: 3
VLDL: 14.6 mg/dL (ref 0.0–40.0)

## 2011-06-19 LAB — BASIC METABOLIC PANEL
CO2: 30 mEq/L (ref 19–32)
GFR: 69.51 mL/min (ref >60.00)
Glucose, Bld: 103 mg/dL — ABNORMAL HIGH (ref 70–99)
Potassium: 4.9 mEq/L (ref 3.5–5.1)
Sodium: 141 mEq/L (ref 135–145)

## 2011-06-19 MED ORDER — TECHNETIUM TC 99M TETROFOSMIN IV KIT
33.0000 | PACK | Freq: Once | INTRAVENOUS | Status: AC | PRN
  Administered 2011-06-19: 33 via INTRAVENOUS

## 2011-06-19 MED ORDER — TECHNETIUM TC 99M TETROFOSMIN IV KIT
11.0000 | PACK | Freq: Once | INTRAVENOUS | Status: AC | PRN
  Administered 2011-06-19: 11 via INTRAVENOUS

## 2011-06-19 NOTE — Progress Notes (Addendum)
[facility] SITE 3 NUCLEAR MED [address] [place] [address] [phone]  Cardiology Nuclear Med Study  Mary Gordon is a 65 y.o. female [id number] 25-Feb-1946   Nuclear Med Background Indication for Stress Test:  Evaluation for Ischemia History:  ~10 yrs ago GXT:OK per patient; 9/10 Echo:EF=55-65% Cardiac Risk Factors: Family History - CAD, History of Smoking and Lipids  Symptoms:  Chest Pain/Tightness with and without Exertion (last episode of chest discomfort was yesterday), Dizziness, DOE, Fatigue, Palpitations and Rapid HR   Nuclear Pre-Procedure Caffeine/Decaff Intake:  None NPO After: 7:00am   Lungs:  Clear. IV 0.9% NS with Angio Cath:  22g  IV Site: L Antecubital  IV Started by:  Irean Hong, RN  Chest Size (in):  36 Cup Size: C  Height: 5\' 4"  (1.626 m)  Weight:  158 lb (71.668 kg)  BMI:  Body mass index is 27.12 kg/(m^2). Tech Comments:  N/A    Nuclear Med Study 1 or 2 day study: 1 day  Stress Test Type:  Stress  Reading MD: Willa Rough, MD  Order Authorizing Provider:  Marca Ancona, MD  Resting Radionuclide: Technetium 85m Tetrofosmin  Resting Radionuclide Dose: 11 mCi   Stress Radionuclide:  Technetium 14m Tetrofosmin  Stress Radionuclide Dose: 33 mCi           Stress Protocol Rest HR: 67 Stress HR: 141  Rest BP: 112/77 Stress BP: 159/73  Exercise Time (min): 7:45 METS: 9.7   Predicted Max HR: 156 bpm % Max HR: 90.38 bpm Rate Pressure Product: 95284   Dose of Adenosine (mg):  n/a Dose of Lexiscan: n/a mg  Dose of Atropine (mg): n/a Dose of Dobutamine: n/a mcg/kg/min (at max HR)  Stress Test Technologist: Smiley Houseman, CMA-N  Nuclear Technologist:  Domenic Polite, CNMT     Rest Procedure:  Myocardial perfusion imaging was performed at rest 45 minutes following the intravenous administration of Technetium 10m Tetrofosmin.  Rest ECG: Nonspecific T-wave changes.  Stress Procedure:  The patient exercised for 7:45 on the  treadmill utilizing the Bruce protocol.  The patient stopped due to fatigue.  She did c/o mild chest pain, 3/10, with exercise.  There were nonspecific  ST-T wave changes.  Technetium 57m Tetrofosmin was injected at peak exercise and myocardial perfusion imaging was performed after a brief delay.  Stress ECG: No significant change from baseline ECG  QPS Raw Data Images:  Normal; no motion artifact; normal heart/lung ratio. Stress Images:  Normal homogeneous uptake in all areas of the myocardium. Rest Images:  Normal homogeneous uptake in all areas of the myocardium. Subtraction (SDS):  No evidence of ischemia. Transient Ischemic Dilatation (Normal <1.22):  .89 Lung/Heart Ratio (Normal <0.45):  .35  Quantitative Gated Spect Images QGS EDV:  50 ml QGS ESV:  10 ml QGS cine images:  Normal Wall Motion QGS EF: 79%  Impression Exercise Capacity:  Good exercise capacity. BP Response:  Normal blood pressure response. Clinical Symptoms:  No chest pain. ECG Impression:  No significant ST segment change suggestive of ischemia. Comparison with Prior Nuclear Study: No previous nuclear study performed  Overall Impression:  Normal stress nuclear study.  Willa Rough    Normal study.  [place]

## 2011-06-20 NOTE — Progress Notes (Signed)
Nuclear report routed to Dr. McLean. Milliani Herrada H  

## 2011-06-23 NOTE — Progress Notes (Signed)
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

## 2011-06-24 ENCOUNTER — Encounter: Payer: Self-pay | Admitting: *Deleted

## 2011-06-24 NOTE — Progress Notes (Signed)
Notified pt results normal. 

## 2011-07-03 ENCOUNTER — Encounter: Payer: Self-pay | Admitting: Cardiology

## 2011-07-07 ENCOUNTER — Encounter: Payer: Self-pay | Admitting: Cardiology

## 2011-07-07 ENCOUNTER — Ambulatory Visit: Payer: BC Managed Care – PPO | Admitting: Cardiology

## 2011-07-07 ENCOUNTER — Ambulatory Visit (INDEPENDENT_AMBULATORY_CARE_PROVIDER_SITE_OTHER): Payer: BC Managed Care – PPO | Admitting: Cardiology

## 2011-07-07 DIAGNOSIS — R002 Palpitations: Secondary | ICD-10-CM

## 2011-07-07 DIAGNOSIS — R079 Chest pain, unspecified: Secondary | ICD-10-CM

## 2011-07-07 DIAGNOSIS — K219 Gastro-esophageal reflux disease without esophagitis: Secondary | ICD-10-CM

## 2011-07-07 MED ORDER — ESOMEPRAZOLE MAGNESIUM 40 MG PO CPDR: 40.0000 mg | DELAYED_RELEASE_CAPSULE | Freq: Every day | ORAL | Status: DC

## 2011-07-07 NOTE — Patient Instructions (Signed)
Your physician has recommended you make the following change in your medication: STOP Prilosec. START Nexium 40mg  daily.  Please follow up as needed.

## 2011-07-08 ENCOUNTER — Encounter: Payer: BC Managed Care – PPO | Admitting: Family Medicine

## 2011-07-08 DIAGNOSIS — K219 Gastro-esophageal reflux disease without esophagitis: Secondary | ICD-10-CM | POA: Insufficient documentation

## 2011-07-08 NOTE — Assessment & Plan Note (Signed)
Holter monitor suggests that this symptom is likely due to PACs.  I reassured her that these are benign.  I offered to let her try a beta blocker, but she thinks that she can handle the symptom without medication as long as she knows what it is.

## 2011-07-08 NOTE — Progress Notes (Signed)
PCP: Dr. Milinda Antis  65 yo with history of hyperlipidemia and palpitations presented initially for cardiology evaluation of chest pain and palpitations.  Patient noted rather severe central chest pressure/pain when walking [place] the [place] a few months ago.  The pain lasted about 2 minutes total then subsided.  After that, she had about 6 more episodes of substernal chest pain.  These lasted several minutes at a time and were at rest.  Patient also will get milder chest pressure more chronically with walking up a hill.  This will be associated with dyspnea and will recover with rest.  Lesser exertion will not bring on the chest pressure.   Chronically, she has had palpitations.  She will feel her heart flutter and beat irregularly, usually when lying down to sleep at night.  She does not notice them as much during the day but they are bothersome to her at night.  No lightheadedness or syncope.  She has had increased coughing and need to clear her throat recently, which she attributes to GERD.  Omeprazole suppresses this to a certain extent but does not seem to be working as well now as in the past.    I had her do an ETT-myoview.  This was a normal study.  She also had a 48 hour holter monitor that showed occasional PACs.   Allergies:  1)  ! Augmentin  Past Medical History: 1. Palpitations: 48 hour holter in 7/12 with occasional PACs.  2. High Cholesterol 3. Panic Disorder 4. Depression 5. Cervical cancer 6. osteopenia  7. vit D def  8. C difficile in past from augmentin 9. Echo (9/10): EF 55-65%, no significant abnormality 10. Chest pain: ETT-myoview (7/12) with EF 79%, no ischemia or infarction.   Family History: Family History Diabetes (other relative) Family History High cholesterol (parent, other relative) Family History Hypertension (parent) Family History  Glaucoma Family History Breast cancer 1st degree relative (other relative)- sister  Mother with MI at  32, sister with atrial fibrillation  Social History: Retired- from book keeping  married  Former Smoker- quit 35 years ago  (smoked for 16 years) Alcohol use-no Drug use-no Regular exercise-no G4P1  Review of Systems        All systems reviewed and negative except as per HPI.   Current Outpatient Prescriptions  Medication Sig Dispense Refill  . Calcium Carbonate-Vitamin D (CALTRATE 600+D) 600-400 MG-UNIT per tablet Take 1 tablet by mouth daily.        . cetirizine (ZYRTEC) 10 MG tablet Take 10 mg by mouth daily.        . cholecalciferol (VITAMIN D) 1000 UNITS tablet Take 1,000 Units by mouth daily.        . cyclobenzaprine (FLEXERIL) 10 MG tablet Take 5-10 mg by mouth 3 (three) times daily as needed.        . Estradiol Acetate (FEMRING) 0.05 MG/24HR RING Place vaginally as needed.        Marland Kitchen HYDROcodone-acetaminophen (VICODIN) 5-500 MG per tablet Take 1-2 tablets by mouth every 6 (six) hours as needed.        . mometasone (NASONEX) 50 MCG/ACT nasal spray 2 sprays in each nostril once daily as needed.  17 g  11  . Multiple Vitamin (MULTIVITAMIN) tablet Take 1 tablet by mouth daily.        . Omega-3 Fatty Acids (FISH OIL) 1200 MG CAPS Take 2 capsules by mouth daily.        . sertraline (ZOLOFT)  50 MG tablet Take 50 mg by mouth daily.        . simvastatin (ZOCOR) 10 MG tablet Take 10 mg by mouth at bedtime.        . traZODone (DESYREL) 100 MG tablet Take 1 tablet by mouth At bedtime.      Marland Kitchen esomeprazole (NEXIUM) 40 MG capsule Take 1 capsule (40 mg total) by mouth daily before breakfast.  30 capsule  6    BP 127/70  Pulse 79  Ht 5\' 4"  (1.626 m)  Wt 163 lb (73.936 kg)  BMI 27.98 kg/m2 General: NAD Neck: No JVD, no thyromegaly or thyroid nodule.  Lungs: Clear to auscultation bilaterally with normal respiratory effort. CV: Nondisplaced PMI.  Heart regular S1/S2, no S3/S4, no murmur.  No peripheral edema.  No carotid bruit.  Normal pedal pulses.  Abdomen: Soft, nontender, no  hepatosplenomegaly, no distention.  Neurologic: Alert and oriented x 3.  Psych: Normal affect. Extremities: No clubbing or cyanosis.

## 2011-07-08 NOTE — Assessment & Plan Note (Signed)
Normal ETT-myoview.  I suspect that this may be related to GERD.  She has not had any further severe episodes.

## 2011-07-08 NOTE — Assessment & Plan Note (Signed)
Patient has a chronic cough as well as frequent need to clear her throat and chest discomfort.  This symptom complex may be due to GERD.  It is worse when she does not take omeprazole, but omeprazole is not helping as much as it did in the past.  I will let her try Nexium to see if this works better .

## 2011-07-24 ENCOUNTER — Encounter: Payer: Self-pay | Admitting: Family Medicine

## 2011-07-25 ENCOUNTER — Encounter: Payer: Self-pay | Admitting: Family Medicine

## 2011-07-25 ENCOUNTER — Ambulatory Visit (INDEPENDENT_AMBULATORY_CARE_PROVIDER_SITE_OTHER): Payer: BC Managed Care – PPO | Admitting: Family Medicine

## 2011-07-25 ENCOUNTER — Other Ambulatory Visit: Payer: Self-pay | Admitting: Family Medicine

## 2011-07-25 DIAGNOSIS — R070 Pain in throat: Secondary | ICD-10-CM

## 2011-07-25 DIAGNOSIS — R490 Dysphonia: Secondary | ICD-10-CM

## 2011-07-25 NOTE — Telephone Encounter (Signed)
Sent in

## 2011-07-25 NOTE — Progress Notes (Signed)
Subjective:    Patient ID: Mary Gordon, female    DOB: Apr 18, 1946, 65 y.o.   MRN: [id number]  HPI CC: sore throat, hoarseness  6-7 mo h/o sinus issues.  Started using afrin nasal spray.  This caused sore throat so switched over to nasonex.  ST continued.  Stopped nasonex.  Cough started, then became productive with green phlegm.  Over the last 3-4 wks, noted hoarse voice as well as pressure on R side of throat.  ST has continued throughout all of this.  So has cough.  At end of day more hoarse than at beginning.  Hasn't used nasonex or afrin spray in 6 months.  ST and pressure in right side of neck going on last several months, hoarseness new as of last 3-4 weeks.  No fevers/chills, abd pain, n/v, NS, weight changes.  No nasal congestion or RN. Quit smoking at age 69yo.  Takes nexium daily, recently changed from omeprazole.  Change helped for reflux, but not cough/hoarseness. Taking zyrtec daily for several years.   Worried about cancer - family hx of other cancers and has a friend with recent dx throat ca.  H/o nasal surgery R side to stop nasal bleed several years ago.  Wt Readings from Last 3 Encounters:  07/25/11 162 lb 12 oz (73.823 kg)  07/07/11 163 lb (73.936 kg)  06/19/11 158 lb (71.668 kg)   Medications and allergies reviewed and updated in chart. Patient Active Problem List  Diagnoses  . UNSPECIFIED VITAMIN D DEFICIENCY  . HYPERCHOLESTEROLEMIA  . DEPRESSION  . ALLERGIC RHINITIS  . PULMONARY NODULE  . MENOPAUSAL SYNDROME  . CERVICAL RADICULOPATHY, RIGHT  . OSTEOPENIA  . FATIGUE  . SKIN RASH  . PALPITATIONS  . Chest pain  . GERD (gastroesophageal reflux disease)   Past Medical History  Diagnosis Date  . Palpitations   . High cholesterol   . Panic disorder   . Depression   . Cervical cancer   . Osteopenia   . Vitamin D deficiency   . History of Clostridium difficile infection     In past from augmentin  . COPD (chronic obstructive pulmonary disease)       ? of on cxr in 9/09, then nl pfts with no obstr or restrictive dz noted  . Pulmonary nodule     Being observed   Past Surgical History  Procedure Date  . Abdominal hysterectomy 1978  . Shoulder surgery 2008    Right shoulder bone spur  . Nasal hemorrhage control 2007  . Hand surgery 2005    Left hand thrombosis  . Colonoscopy 2002    Normal   History  Substance Use Topics  . Smoking status: Former Smoker -- 0.8 packs/day for 16 years    Quit date: 12/08/1978  . Smokeless tobacco: Not on file   Comment: Started at age 62  . Alcohol Use: No   Family History  Problem Relation Age of Onset  . Heart attack Mother 72  . Emphysema Mother   . Allergies Mother   . Asthma Mother   . Allergies Father   . Arrhythmia Sister     Atrial fibrillation  . Diabetes Other     Parent  . Hyperlipidemia Other     Other relative  . Hypertension Other     Parent, other relative  . Glaucoma Other   . Breast cancer Other     Other relative, sister  . Allergies Other     Siblings  . Asthma Other  Sisters  . Emphysema Sister   . Heart disease Sister   . Rheumatologic disease Sister   . Lung cancer Sister    Allergies  Allergen Reactions  . Augmentin Other (See Comments)    Cdiff   Current Outpatient Prescriptions on File Prior to Visit  Medication Sig Dispense Refill  . cetirizine (ZYRTEC) 10 MG tablet Take 10 mg by mouth daily.        Marland Kitchen esomeprazole (NEXIUM) 40 MG capsule Take 1 capsule (40 mg total) by mouth daily before breakfast.  30 capsule  6  . Estradiol Acetate (FEMRING) 0.05 MG/24HR RING Place vaginally as needed.        . mometasone (NASONEX) 50 MCG/ACT nasal spray 2 sprays in each nostril once daily as needed.  17 g  11  . sertraline (ZOLOFT) 50 MG tablet Take 50 mg by mouth daily.        . simvastatin (ZOCOR) 10 MG tablet Take 10 mg by mouth at bedtime.        . traZODone (DESYREL) 100 MG tablet Take 1 tablet by mouth At bedtime.      . Calcium Carbonate-Vitamin  D (CALTRATE 600+D) 600-400 MG-UNIT per tablet Take 1 tablet by mouth daily.        . cholecalciferol (VITAMIN D) 1000 UNITS tablet Take 1,000 Units by mouth daily.        . Multiple Vitamin (MULTIVITAMIN) tablet Take 1 tablet by mouth daily.        . Omega-3 Fatty Acids (FISH OIL) 1200 MG CAPS Take 2 capsules by mouth daily.         Review of Systems Per HPI    Objective:   Physical Exam  Nursing note and vitals reviewed. Constitutional: She appears well-developed and well-nourished. No distress.  HENT:  Head: Normocephalic and atraumatic.  Right Ear: Hearing, tympanic membrane, external ear and ear canal normal.  Left Ear: Hearing, tympanic membrane, external ear and ear canal normal.  Nose: Nose normal. No mucosal edema or rhinorrhea. Right sinus exhibits no maxillary sinus tenderness and no frontal sinus tenderness. Left sinus exhibits no maxillary sinus tenderness and no frontal sinus tenderness.  Mouth/Throat: Uvula is midline, oropharynx is clear and moist and mucous membranes are normal. No oropharyngeal exudate, posterior oropharyngeal edema, posterior oropharyngeal erythema or tonsillar abscesses.  Eyes: Conjunctivae and EOM are normal. Pupils are equal, round, and reactive to light. No scleral icterus.  Neck: Normal range of motion. Neck supple. No thyromegaly present.  Cardiovascular: Normal rate, regular rhythm, normal heart sounds and intact distal pulses.   No murmur heard. Pulmonary/Chest: Effort normal and breath sounds normal. No respiratory distress. She has no wheezes. She has no rales.  Lymphadenopathy:    She has no cervical adenopathy.  Skin: Skin is warm and dry. No rash noted.  Psychiatric: She has a normal mood and affect.          Assessment & Plan:

## 2011-07-25 NOTE — Patient Instructions (Signed)
Trial of other antihistamine - allegra or claritin instead of zyrtec. Nasal saline irrigation daily. Pass by [facility] office for ENT referral Good to see you today, call us with questions.

## 2011-07-25 NOTE — Assessment & Plan Note (Addendum)
Exam normal today as well as voice, however given longstanding sxs and new subjective hoarseness (worse at night), will refer to ENT for further eval. Recommended switch to another antihistamine in meantime. Recommend nasal saline irrigation. Avoid INS for now as seemed to worsen ST.

## 2011-07-25 NOTE — Telephone Encounter (Signed)
[facility] [address] electronically request refill Sertraline 50 mg. Please advise.

## 2011-07-25 NOTE — Telephone Encounter (Signed)
[facility] [address] electronically request refill Simvastatin 10 mg #90 x0. Pt already scheduled for check up with Dr Milinda Antis 09/26/11. Please advise Sertraline 50 mg refill.

## 2011-08-02 ENCOUNTER — Other Ambulatory Visit: Payer: Self-pay | Admitting: Obstetrics & Gynecology

## 2011-08-08 NOTE — Telephone Encounter (Signed)
Patient is requesting refill of Femring.  She was just seen on 7/3-12 for a yearly exam. Refills authorized for one year.

## 2011-08-12 ENCOUNTER — Other Ambulatory Visit: Payer: Self-pay

## 2011-08-15 ENCOUNTER — Encounter: Payer: Self-pay | Admitting: Family Medicine

## 2011-08-22 ENCOUNTER — Encounter: Payer: Self-pay | Admitting: Family Medicine

## 2011-08-25 ENCOUNTER — Other Ambulatory Visit: Payer: Self-pay | Admitting: Pulmonary Disease

## 2011-08-25 ENCOUNTER — Telehealth: Payer: Self-pay | Admitting: Pulmonary Disease

## 2011-08-25 DIAGNOSIS — J984 Other disorders of lung: Secondary | ICD-10-CM

## 2011-08-25 NOTE — Telephone Encounter (Signed)
KC, pt due for CT Chest in Oct 2012- pls advise if with or without cm or send order to [facility], thanks!

## 2011-08-26 ENCOUNTER — Other Ambulatory Visit: Payer: Self-pay | Admitting: Family Medicine

## 2011-08-26 NOTE — Telephone Encounter (Signed)
Will refill electronically She has f/u with me next mo

## 2011-08-26 NOTE — Telephone Encounter (Signed)
[facility] [address] electronically request  Refill Trazodone 100 mg.Please advise. Last refill 07/25/11.

## 2011-08-29 ENCOUNTER — Other Ambulatory Visit: Payer: Self-pay | Admitting: Pulmonary Disease

## 2011-08-29 DIAGNOSIS — J984 Other disorders of lung: Secondary | ICD-10-CM

## 2011-08-29 NOTE — Telephone Encounter (Signed)
Order was placed.Carron Curie, CMA

## 2011-09-10 ENCOUNTER — Ambulatory Visit (INDEPENDENT_AMBULATORY_CARE_PROVIDER_SITE_OTHER)
Admission: RE | Admit: 2011-09-10 | Discharge: 2011-09-10 | Disposition: A | Discharge status: Home / Self Care | Payer: Medicare Other | Source: Ambulatory Visit | Attending: Pulmonary Disease | Admitting: Pulmonary Disease

## 2011-09-10 DIAGNOSIS — J984 Other disorders of lung: Secondary | ICD-10-CM

## 2011-09-12 ENCOUNTER — Encounter: Payer: Self-pay | Admitting: Pulmonary Disease

## 2011-09-12 ENCOUNTER — Ambulatory Visit (INDEPENDENT_AMBULATORY_CARE_PROVIDER_SITE_OTHER): Payer: Medicare Other | Admitting: Pulmonary Disease

## 2011-09-12 VITALS — BP 110/60 | HR 87 | Temp 97.8°F | Ht 64.0 in | Wt 161.2 lb

## 2011-09-12 DIAGNOSIS — J984 Other disorders of lung: Secondary | ICD-10-CM

## 2011-09-12 NOTE — Progress Notes (Signed)
  Subjective:    Patient ID: Mary Gordon, female    DOB: [birth year], 65 y.o.   MRN: [id number]  HPI No visit.  Pt wrongly scheduled   Review of Systems  Constitutional: Negative for fever and unexpected weight change.  HENT: Negative for ear pain, nosebleeds, congestion, sore throat, rhinorrhea, sneezing, trouble swallowing, dental problem, postnasal drip and sinus pressure.   Eyes: Negative for redness and itching.  Respiratory: Positive for cough. Negative for chest tightness and wheezing.   Cardiovascular: Negative for palpitations and leg swelling.  Gastrointestinal: Negative for nausea and vomiting.  Genitourinary: Negative for dysuria.  Musculoskeletal: Negative for joint swelling.  Skin: Negative for rash.  Neurological: Negative for headaches.  Hematological: Does not bruise/bleed easily.  Psychiatric/Behavioral: Negative for dysphoric mood. The patient is not nervous/anxious.        Objective:   Physical Exam        Assessment & Plan:

## 2011-09-16 ENCOUNTER — Telehealth: Payer: Self-pay | Admitting: Family Medicine

## 2011-09-16 DIAGNOSIS — E7800 Pure hypercholesterolemia, unspecified: Secondary | ICD-10-CM

## 2011-09-16 DIAGNOSIS — E559 Vitamin D deficiency, unspecified: Secondary | ICD-10-CM

## 2011-09-16 DIAGNOSIS — M949 Disorder of cartilage, unspecified: Secondary | ICD-10-CM

## 2011-09-16 DIAGNOSIS — K219 Gastro-esophageal reflux disease without esophagitis: Secondary | ICD-10-CM

## 2011-09-16 DIAGNOSIS — R5381 Other malaise: Secondary | ICD-10-CM

## 2011-09-16 NOTE — Telephone Encounter (Signed)
Message copied by Judy Pimple on Tue Sep 16, 2011  9:24 PM ------      Message from: Alvina Chou      Created: Tue Sep 16, 2011  9:53 AM      Regarding: labs for Friday 10-12       Patient is scheduled for CPX labs, please order future labs, Thanks , Camelia Eng

## 2011-09-19 ENCOUNTER — Other Ambulatory Visit (INDEPENDENT_AMBULATORY_CARE_PROVIDER_SITE_OTHER): Payer: Medicare Other

## 2011-09-19 DIAGNOSIS — K219 Gastro-esophageal reflux disease without esophagitis: Secondary | ICD-10-CM

## 2011-09-19 DIAGNOSIS — E7800 Pure hypercholesterolemia, unspecified: Secondary | ICD-10-CM

## 2011-09-19 DIAGNOSIS — E559 Vitamin D deficiency, unspecified: Secondary | ICD-10-CM

## 2011-09-19 DIAGNOSIS — R5383 Other fatigue: Secondary | ICD-10-CM

## 2011-09-19 DIAGNOSIS — R5381 Other malaise: Secondary | ICD-10-CM

## 2011-09-19 DIAGNOSIS — M899 Disorder of bone, unspecified: Secondary | ICD-10-CM

## 2011-09-19 LAB — LIPID PANEL
Cholesterol: 168 mg/dL (ref 0–200)
HDL: 62.4 mg/dL (ref >39.00)
LDL Cholesterol: 90 mg/dL (ref 0–99)
Triglycerides: 79 mg/dL (ref 0.0–149.0)
VLDL: 15.8 mg/dL (ref 0.0–40.0)

## 2011-09-19 LAB — COMPREHENSIVE METABOLIC PANEL
Alkaline Phosphatase: 85 U/L (ref 39–117)
BUN: 10 mg/dL (ref 6–23)
CO2: 29 mEq/L (ref 19–32)
GFR: 64.31 mL/min (ref >60.00)
Glucose, Bld: 109 mg/dL — ABNORMAL HIGH (ref 70–99)
Total Bilirubin: 0.7 mg/dL (ref 0.3–1.2)
Total Protein: 7 g/dL (ref 6.0–8.3)

## 2011-09-19 LAB — CBC WITH DIFFERENTIAL/PLATELET
Basophils Absolute: 0 10*3/uL (ref 0.0–0.1)
Eosinophils Absolute: 0.1 10*3/uL (ref 0.0–0.7)
Lymphocytes Relative: 32.9 % (ref 12.0–46.0)
MCHC: 32.5 g/dL (ref 30.0–36.0)
Neutrophils Relative %: 57.6 % (ref 43.0–77.0)
Platelets: 265 10*3/uL (ref 150.0–400.0)
RDW: 13.5 % (ref 11.5–14.6)

## 2011-09-26 ENCOUNTER — Encounter: Payer: Self-pay | Admitting: Family Medicine

## 2011-09-26 ENCOUNTER — Encounter: Payer: Self-pay | Admitting: Internal Medicine

## 2011-09-26 ENCOUNTER — Ambulatory Visit (INDEPENDENT_AMBULATORY_CARE_PROVIDER_SITE_OTHER): Payer: Medicare Other | Admitting: Family Medicine

## 2011-09-26 VITALS — BP 100/70 | HR 68 | Temp 98.2°F | Ht 64.0 in | Wt 159.8 lb

## 2011-09-26 DIAGNOSIS — K219 Gastro-esophageal reflux disease without esophagitis: Secondary | ICD-10-CM

## 2011-09-26 DIAGNOSIS — R1011 Right upper quadrant pain: Secondary | ICD-10-CM

## 2011-09-26 DIAGNOSIS — Z1211 Encounter for screening for malignant neoplasm of colon: Secondary | ICD-10-CM | POA: Insufficient documentation

## 2011-09-26 DIAGNOSIS — F329 Major depressive disorder, single episode, unspecified: Secondary | ICD-10-CM

## 2011-09-26 DIAGNOSIS — E7800 Pure hypercholesterolemia, unspecified: Secondary | ICD-10-CM

## 2011-09-26 DIAGNOSIS — Z23 Encounter for immunization: Secondary | ICD-10-CM

## 2011-09-26 NOTE — Patient Instructions (Signed)
Stop the nexium  We will schedule abdominal ultrasound at check out  We will refer you for screening colonoscopy at check out  No other changes

## 2011-09-26 NOTE — Progress Notes (Signed)
Subjective:    Patient ID: Mary Gordon, female    DOB: 04-Oct-1946, 65 y.o.   MRN: [id number]  HPI Here for f/u of hyperlipidemia and depression  And she wanted to discuss several issues , and to discuss colon cancer screening   Is starting medicare -this may affect her coverage   Has been off nexium for 8 days now - and seems to be doing ok -cough has not changed (nexium never really helped it )  No heartburn  Coughs up a little bit of phlegm  Has hx of nodule in lung -- followed by Dr Shelle Iron -- no f/u was planned  She is not concerned about her cough at this time   This is year 10 since colonoscopy - wants to get scheduled for that  Just screening - no hx of polyps   She is suspicious about possible gallbladder issues  occ gets painful attacks - R side flank/ anterior or back  Has had 3-4 episodes -- ? Is going on for a few years  Is triggered by sugary foods -- cookies and cakes  No n/v    Mood is good on zoloft  Started that for mild depression and panic disorder  Was going through something all the time  Wonders about the cough   Has been feeling ok overall   Wt is down 2 lb with bmi of 27 110/70- good bp   Had a stress test - it went well  Never found out what was going on  Ins did not pay for all of it   Lipids are good - imroved with LDL 90 Lab Results  Component Value Date   CHOL 168 09/19/2011   CHOL 197 06/19/2011   CHOL 176 04/22/2010   Lab Results  Component Value Date   HDL 62.40 09/19/2011   HDL 63.90 06/19/2011   HDL 47.82 04/22/2010   Lab Results  Component Value Date   LDLCALC 90 09/19/2011   LDLCALC 119* 06/19/2011   LDLCALC 96 04/22/2010   Lab Results  Component Value Date   TRIG 79.0 09/19/2011   TRIG 73.0 06/19/2011   TRIG 77.0 04/22/2010   Lab Results  Component Value Date   CHOLHDL 3 09/19/2011   CHOLHDL 3 06/19/2011   CHOLHDL 3 04/22/2010   No results found for this basename: LDLDIRECT    Diet - has been trying to eat better /  and lost a little wt  Also getting some more exercise - walking and a rowing machine - enjoys it   Gluc 109-need to watch that  Does watch her sugar (but does love it ) - not overdoing it   Patient Active Problem List  Diagnoses  . UNSPECIFIED VITAMIN D DEFICIENCY  . HYPERCHOLESTEROLEMIA  . DEPRESSION  . ALLERGIC RHINITIS  . PULMONARY NODULE  . MENOPAUSAL SYNDROME  . CERVICAL RADICULOPATHY, RIGHT  . OSTEOPENIA  . FATIGUE  . SKIN RASH  . PALPITATIONS  . Chest pain  . GERD (gastroesophageal reflux disease)  . Hoarseness  . Right upper quadrant abdominal pain  . Special screening for malignant neoplasms, colon   Past Medical History  Diagnosis Date  . Palpitations   . High cholesterol   . Panic disorder   . Depression   . Cervical cancer   . Osteopenia   . Vitamin D deficiency   . History of Clostridium difficile infection     In past from augmentin  . COPD (chronic obstructive pulmonary disease)     ?  of on cxr in 9/09, then nl pfts with no obstr or restrictive dz noted  . Pulmonary nodule     Being observed   Past Surgical History  Procedure Date  . Abdominal hysterectomy 1978  . Shoulder surgery 2008    Right shoulder bone spur  . Nasal hemorrhage control 2007  . Hand surgery 2005    Left hand thrombosis  . Colonoscopy 2002    Normal   History  Substance Use Topics  . Smoking status: Former Smoker -- 0.8 packs/day for 16 years    Types: Cigarettes    Quit date: 12/08/1978  . Smokeless tobacco: Not on file   Comment: Started at age 6  . Alcohol Use: No   Family History  Problem Relation Age of Onset  . Heart attack Mother 59  . Emphysema Mother   . Allergies Mother   . Asthma Mother   . Allergies Father   . Arrhythmia Sister     Atrial fibrillation  . Diabetes Other     Parent  . Hyperlipidemia Other     Other relative  . Hypertension Other     Parent, other relative  . Glaucoma Other   . Breast cancer Other     Other relative, sister  .  Allergies Other     Siblings  . Asthma Other     Sisters  . Emphysema Sister   . Heart disease Sister   . Rheumatologic disease Sister   . Lung cancer Sister    Allergies  Allergen Reactions  . Augmentin Other (See Comments)    Cdiff   Current Outpatient Prescriptions on File Prior to Visit  Medication Sig Dispense Refill  . FEMRING 0.05 MG/24HR RING INSERT ONE RING EVERY THREE MONTHS  1 each  11  . fexofenadine (ALLEGRA) 180 MG tablet Take 180 mg by mouth daily.        . sertraline (ZOLOFT) 50 MG tablet TAKE ONE TABLET BY MOUTH EVERY DAY  90 tablet  1  . simvastatin (ZOCOR) 10 MG tablet TAKE ONE TABLET BY MOUTH EVERY DAY  90 tablet  0  . traZODone (DESYREL) 100 MG tablet TAKE ONE TABLET BY MOUTH EVERY DAY  30 tablet  2  . TURMERIC CURCUMIN PO Take 1 capsule by mouth daily.        . cholecalciferol (VITAMIN D) 1000 UNITS tablet Take 1,000 Units by mouth daily.        . mometasone (NASONEX) 50 MCG/ACT nasal spray 2 sprays in each nostril once daily as needed.  17 g  11  . Multiple Vitamin (MULTIVITAMIN) tablet Take 1 tablet by mouth daily.        . Omega-3 Fatty Acids (FISH OIL) 1200 MG CAPS Take 2 capsules by mouth daily.              Review of Systems Review of Systems  Constitutional: Negative for fever, appetite change, fatigue and unexpected weight change.  Eyes: Negative for pain and visual disturbance.  Respiratory: Negative for cough and shortness of breath.   Cardiovascular: Negative for cp or palpitations    Gastrointestinal: Negative for nausea, diarrhea and constipation. no blood in stool, pos for RUQ abd pain  Genitourinary: Negative for urgency and frequency.  Skin: Negative for pallor or rash   Neurological: Negative for weakness, light-headedness, numbness and headaches.  Hematological: Negative for adenopathy. Does not bruise/bleed easily.  Psychiatric/Behavioral: Negative for dysphoric mood. The patient is not nervous/anxious.  Objective:    Physical Exam  Constitutional: She appears well-developed and well-nourished. No distress.       Mildly overwt and well appearing   HENT:  Head: Normocephalic and atraumatic.  Mouth/Throat: Oropharynx is clear and moist.  Eyes: Conjunctivae and EOM are normal. Pupils are equal, round, and reactive to light. No scleral icterus.  Neck: Normal range of motion. Neck supple. No JVD present. Carotid bruit is not present. No thyromegaly present.  Cardiovascular: Normal rate, regular rhythm, normal heart sounds and intact distal pulses.  Exam reveals no gallop.   Pulmonary/Chest: Effort normal and breath sounds normal. No respiratory distress. She has no wheezes. She exhibits no tenderness.  Abdominal: Soft. Bowel sounds are normal. She exhibits no distension, no abdominal bruit and no mass. There is tenderness. There is no rebound and no guarding.       Mild RUQ tenderness into flank  Murphy sign neg    Musculoskeletal: She exhibits no edema and no tenderness.  Lymphadenopathy:    She has no cervical adenopathy.  Neurological: She is alert. She has normal reflexes. Coordination normal.  Skin: Skin is warm and dry. No rash noted. No erythema. No pallor.  Psychiatric: She has a normal mood and affect.          Assessment & Plan:

## 2011-09-29 NOTE — Assessment & Plan Note (Signed)
Improved with better diet Rev lab with pt Rev low sat fat diet  Enc to keep up the good work  No problems with zocor at this time

## 2011-09-29 NOTE — Assessment & Plan Note (Signed)
Schedule for screen colonoscopy No stool changes

## 2011-09-29 NOTE — Assessment & Plan Note (Signed)
Overall doing fairly well on current meds without change Stressed imp of exercise also

## 2011-09-29 NOTE — Assessment & Plan Note (Signed)
New and intermittent  Get abd [place] to rule out gallbladder pathology

## 2011-09-29 NOTE — Assessment & Plan Note (Signed)
Pt does not feel she needs nexium- is currently symptom free without it  Will stop it -but update if heartburn or other symptoms

## 2011-10-02 ENCOUNTER — Telehealth: Payer: Self-pay | Admitting: *Deleted

## 2011-10-02 ENCOUNTER — Ambulatory Visit (AMBULATORY_SURGERY_CENTER): Payer: Medicare Other | Admitting: *Deleted

## 2011-10-02 VITALS — Ht 64.0 in | Wt 157.0 lb

## 2011-10-02 DIAGNOSIS — Z1211 Encounter for screening for malignant neoplasm of colon: Secondary | ICD-10-CM

## 2011-10-02 MED ORDER — PEG-KCL-NACL-NASULF-NA ASC-C 100 G PO SOLR: ORAL | Status: DC

## 2011-10-02 NOTE — Telephone Encounter (Signed)
Mary, Mary Gordon had a normal colonoscopy in [place] 10 years ago.  I'm giving you the release of medical records form.  Her colon is scheduled for 10/16/11.  Mary Gordon

## 2011-10-03 ENCOUNTER — Ambulatory Visit
Admission: RE | Admit: 2011-10-03 | Discharge: 2011-10-03 | Disposition: A | Discharge status: Home / Self Care | Payer: Medicare Other | Source: Ambulatory Visit | Attending: Family Medicine | Admitting: Family Medicine

## 2011-10-03 DIAGNOSIS — R1011 Right upper quadrant pain: Secondary | ICD-10-CM

## 2011-10-03 NOTE — Telephone Encounter (Signed)
Noted  

## 2011-10-06 ENCOUNTER — Telehealth: Payer: Self-pay | Admitting: Family Medicine

## 2011-10-06 DIAGNOSIS — R21 Rash and other nonspecific skin eruption: Secondary | ICD-10-CM

## 2011-10-06 DIAGNOSIS — R1011 Right upper quadrant pain: Secondary | ICD-10-CM

## 2011-10-06 NOTE — Telephone Encounter (Signed)
I had already commented on it - so ignore that if you inform her now  [place] is negative- nl appearing gallbladder Let me know if symptoms persist or worsen, thanks

## 2011-10-06 NOTE — Telephone Encounter (Signed)
Would like to hear ultrasound results from Friday.  Patient call back is [phone]

## 2011-10-07 NOTE — Telephone Encounter (Signed)
I would recommend GI referral - let me know if she is ok with that and I will do it

## 2011-10-07 NOTE — Telephone Encounter (Signed)
I think she would need eval (sit down visit with Dr Juanda Chance) before or after the procedure Will do ref

## 2011-10-07 NOTE — Telephone Encounter (Signed)
Patient notified as instructed by telephone. Pt was glad to hear normal [place] but pt said she continues to have constant pressure and discomfort in rt upper abdomen. With occasional sharp pain on and off in the rt upper abdomen. Please advise. Pt can be reached [phone].

## 2011-10-07 NOTE — Telephone Encounter (Signed)
Patient notified as instructed by telephone. Pt is agreeable to get GI referral. Pt is already scheduled for colonoscopy with Dr [name] on 10/16/11. Pt wants to know if this rt side problem could be seen on 10/16/11 also. Please advise.Marland Kitchen Pt will wait to hear from pt care coordinator.

## 2011-10-16 ENCOUNTER — Ambulatory Visit (AMBULATORY_SURGERY_CENTER): Payer: Medicare Other | Admitting: Internal Medicine

## 2011-10-16 ENCOUNTER — Other Ambulatory Visit: Payer: Medicare Other | Admitting: Internal Medicine

## 2011-10-16 ENCOUNTER — Encounter: Payer: Self-pay | Admitting: Internal Medicine

## 2011-10-16 VITALS — BP 95/55 | HR 83 | Temp 96.7°F | Resp 16 | Ht 64.0 in | Wt 157.0 lb

## 2011-10-16 DIAGNOSIS — Z1211 Encounter for screening for malignant neoplasm of colon: Secondary | ICD-10-CM

## 2011-10-16 MED ORDER — SODIUM CHLORIDE 0.9 % IV SOLN: 500.0000 mL | INTRAVENOUS | Status: DC

## 2011-10-16 NOTE — Patient Instructions (Signed)
Please refer to your blue and neon green sheets for instructions regarding diet and activity for the rest of today.  You may resume your medications as you would normally take them.  

## 2011-10-17 ENCOUNTER — Telehealth: Payer: Self-pay | Admitting: *Deleted

## 2011-10-17 NOTE — Telephone Encounter (Signed)

## 2011-10-20 ENCOUNTER — Telehealth: Payer: Self-pay | Admitting: Internal Medicine

## 2011-10-20 NOTE — Telephone Encounter (Signed)
Received 3 pages from [facility]/ Dr. Toy Baker. Forwarded to Dr. Juanda Chance for review. 10/20/11-ar

## 2011-10-23 ENCOUNTER — Other Ambulatory Visit: Payer: Self-pay

## 2011-10-23 MED ORDER — SIMVASTATIN 10 MG PO TABS: 10.0000 mg | ORAL_TABLET | Freq: Every day | ORAL | Status: DC

## 2011-10-23 NOTE — Telephone Encounter (Signed)
[facility] faxed refill request for Simvastatin 10 meq #90 x 1.

## 2011-11-17 ENCOUNTER — Encounter: Payer: Self-pay | Admitting: Internal Medicine

## 2011-11-17 ENCOUNTER — Ambulatory Visit (INDEPENDENT_AMBULATORY_CARE_PROVIDER_SITE_OTHER): Payer: Medicare Other | Admitting: Internal Medicine

## 2011-11-17 VITALS — BP 98/62 | HR 80 | Ht 64.0 in | Wt 157.6 lb

## 2011-11-17 DIAGNOSIS — R1011 Right upper quadrant pain: Secondary | ICD-10-CM

## 2011-11-17 NOTE — Progress Notes (Signed)
Mary Gordon September 22, 1946 MRN [id number]    History of Present Illness:  This is a 65 year old white female with several months history of right costal margin pain which radiates laterally and to her costovertebral angle. The pain is relieved by lying down at night. It is not associated with meals or bowel habits. She had a recent colonoscopy which showed a tortuous and redundant colon. This was also noticed on a prior colonoscopy in 2002 at [facility]. Her upper abdominal ultrasound in October 2012 was normal including the common bile duct and liver. The liver function tests are normal. She has irregular bowel habits with mostly constipation. There is no family history of gallbladder disease or colon cancer.   Past Medical History  Diagnosis Date  . Palpitations   . High cholesterol   . Panic disorder   . Depression   . Osteopenia   . Vitamin D deficiency   . History of Clostridium difficile infection     In past from augmentin  . COPD (chronic obstructive pulmonary disease)     ? of on cxr in 9/09, then nl pfts with no obstr or restrictive dz noted  . Pulmonary nodule     Being observed  . Allergy     all year  . Cervical cancer 1978  . Cataract   . Anxiety   . GERD (gastroesophageal reflux disease)    Past Surgical History  Procedure Date  . Abdominal hysterectomy 1978  . Shoulder surgery 2008    Right shoulder bone spur  . Nasal hemorrhage control 2007  . Hand surgery 2005    Left hand thrombosis  . Colonoscopy 2002    Normal    reports that she quit smoking about 32 years ago. Her smoking use included Cigarettes. She has a 12.8 pack-year smoking history. She has never used smokeless tobacco. She reports that she does not drink alcohol or use illicit drugs. family history includes Allergies in her father, mother, and other; Arrhythmia in her sister; Asthma in her mother and other; Breast cancer in her other; Diabetes in her other; Emphysema in her mother and  sister; Glaucoma in her other; Heart attack (age of onset:65) in her mother; Heart disease in her sister; Hyperlipidemia in her other; Hypertension in her other; Lung cancer in her sister; and Rheumatologic disease in her sister. Allergies  Allergen Reactions  . Augmentin Other (See Comments)    Cdiff        Review of Systems:  The remainder of the 10 point ROS is negative except as outlined in H&P   Physical Exam: General appearance  Well developed, in no distress. Eyes- non icteric. HEENT nontraumatic, normocephalic. Mouth no lesions, tongue papillated, no cheilosis. Neck supple without adenopathy, thyroid not enlarged, no carotid bruits, no JVD. Lungs Clear to auscultation bilaterally. Rib cage is tender along her right costal margin laterally and to be back all the way to the spine.  Cor normal S1, normal S2, regular rhythm, no murmur,  quiet precordium. Abdomen: Very soft nontender with normoactive bowel sounds. No pain on inspiration. Liver edge at costal margin. Prominent anal papillae but does not precipitate pain. Pain does not extends to right lower quadrant. Rectal: Extremities no pedal edema. Skin no lesions. Neurological alert and oriented x 3. Psychological normal mood and affect.  Assessment and Plan:  Problem #1 Right-sided abdominal and rib cage pain is most likely musculoskeletal. This is less likely of GI origin. She has a tortuous colon but the physical  exam suggests that this is more of a musculoskeletal problem. I will start her on diclofenac 75 mg daily. She may also use a heating pad. If the symptoms continue despite diclofenac, she will also try Metamucil or Benefiber and probiotics to improve her bowel habits. Although there is no family history of gallbladder disease, HIDA scan may have to be done in the future to assure that we not dealing with biliary problems.   11/17/2011 [name]

## 2011-11-17 NOTE — Patient Instructions (Signed)
Dr [name] 

## 2011-11-25 ENCOUNTER — Other Ambulatory Visit: Payer: Self-pay

## 2011-11-25 MED ORDER — TRAZODONE HCL 100 MG PO TABS: 100.0000 mg | ORAL_TABLET | Freq: Every day | ORAL | Status: DC

## 2011-11-25 NOTE — Telephone Encounter (Signed)
Will refill electronically  

## 2011-11-25 NOTE — Telephone Encounter (Signed)
[facility] request refill Trazodone 100 mg. Last filled 10/20/11 and pt last seen 09/26/11.Please advise.

## 2012-04-17 ENCOUNTER — Other Ambulatory Visit: Payer: Self-pay | Admitting: Family Medicine

## 2012-04-18 NOTE — Telephone Encounter (Signed)
Will route to PCP 

## 2012-04-19 NOTE — Telephone Encounter (Signed)
Will refill electronically  

## 2012-05-07 ENCOUNTER — Other Ambulatory Visit: Payer: Self-pay | Admitting: *Deleted

## 2012-05-07 MED ORDER — SIMVASTATIN 10 MG PO TABS: 10.0000 mg | ORAL_TABLET | Freq: Every day | ORAL | Status: DC

## 2012-05-26 ENCOUNTER — Other Ambulatory Visit: Payer: Self-pay | Admitting: Obstetrics & Gynecology

## 2012-05-26 DIAGNOSIS — Z1231 Encounter for screening mammogram for malignant neoplasm of breast: Secondary | ICD-10-CM

## 2012-05-31 ENCOUNTER — Encounter: Payer: Self-pay | Admitting: Family Medicine

## 2012-05-31 ENCOUNTER — Ambulatory Visit (INDEPENDENT_AMBULATORY_CARE_PROVIDER_SITE_OTHER): Payer: Medicare Other | Admitting: Family Medicine

## 2012-05-31 VITALS — BP 102/68 | HR 82 | Temp 98.3°F | Wt 157.0 lb

## 2012-05-31 DIAGNOSIS — M542 Cervicalgia: Secondary | ICD-10-CM

## 2012-05-31 DIAGNOSIS — R221 Localized swelling, mass and lump, neck: Secondary | ICD-10-CM

## 2012-05-31 DIAGNOSIS — IMO0001 Reserved for inherently not codable concepts without codable children: Secondary | ICD-10-CM | POA: Diagnosis not present

## 2012-05-31 DIAGNOSIS — R5381 Other malaise: Secondary | ICD-10-CM

## 2012-05-31 DIAGNOSIS — R5383 Other fatigue: Secondary | ICD-10-CM | POA: Diagnosis not present

## 2012-05-31 DIAGNOSIS — M797 Fibromyalgia: Secondary | ICD-10-CM | POA: Insufficient documentation

## 2012-05-31 DIAGNOSIS — R220 Localized swelling, mass and lump, head: Secondary | ICD-10-CM | POA: Diagnosis not present

## 2012-05-31 DIAGNOSIS — M791 Myalgia, unspecified site: Secondary | ICD-10-CM

## 2012-05-31 NOTE — Progress Notes (Signed)
Muscle aches worse over last few months and she stopped simvastatin 3 days ago.  Some better in meantime but not resolved.  Diffuse aches, neck, shoulders, wrists, legs, feet, back, hips.  Worse proximally than distally on the extremities.    Fatigue.  Can nap anytime and this isn't typical for patient.  Going on for a few weeks.  Doesn't snore. Wakes tired, then worse as the day goes on.  No FCNAVD.  Not clearly associated with any other symptoms.   L sided neck pain, anterior. She thought she felt a hard knot on the L side.  Is ttp per patient.  No dysphagia.    PMH and SH reviewed  ROS: See HPI, otherwise noncontributory.  Meds, vitals, and allergies reviewed.   nad ncat Mmm Neck supple, no LA but an asymmetric hard area is felt on the L side of the mid neck  rrr ctab Abd soft, not ttp Ext w/o edema Quads ttp diffusely > calves B No muscle weakness on testing.  Sensation intact in the ext x4 Gait and speech wnl

## 2012-05-31 NOTE — Assessment & Plan Note (Signed)
Statin vs PMR? Check ESR and CK. See notes on labs.

## 2012-05-31 NOTE — Patient Instructions (Addendum)
Look at http://www.heart.org for cholesterol tips.  Then talk with [name] about getting your lipids rechecked.  Go to the lab on the way out.  We'll contact you with your lab report. See [name] about your referral before you leave today. Stay off the simvastatin.  Mary Gordon

## 2012-05-31 NOTE — Assessment & Plan Note (Signed)
Refer for u/s.  D/wpt. She agrees.

## 2012-05-31 NOTE — Assessment & Plan Note (Signed)
Unclear source, possibly related to the myalgias, ie PMR.  Check CBC and TSH.

## 2012-06-01 LAB — CBC WITH DIFFERENTIAL/PLATELET
Basophils Relative: 0.6 % (ref 0.0–3.0)
Eosinophils Absolute: 0.2 10*3/uL (ref 0.0–0.7)
MCHC: 32.3 g/dL (ref 30.0–36.0)
MCV: 88.2 fl (ref 78.0–100.0)
Monocytes Absolute: 0.5 10*3/uL (ref 0.1–1.0)
Neutro Abs: 5.8 10*3/uL (ref 1.4–7.7)
Neutrophils Relative %: 64.4 % (ref 43.0–77.0)
RBC: 4.58 Mil/uL (ref 3.87–5.11)

## 2012-06-02 ENCOUNTER — Ambulatory Visit
Admission: RE | Admit: 2012-06-02 | Discharge: 2012-06-02 | Disposition: A | Discharge status: Home / Self Care | Payer: Medicare Other | Source: Ambulatory Visit | Attending: Family Medicine | Admitting: Family Medicine

## 2012-06-02 DIAGNOSIS — M542 Cervicalgia: Secondary | ICD-10-CM

## 2012-06-02 DIAGNOSIS — E049 Nontoxic goiter, unspecified: Secondary | ICD-10-CM | POA: Diagnosis not present

## 2012-06-03 ENCOUNTER — Encounter: Payer: Self-pay | Admitting: *Deleted

## 2012-06-16 ENCOUNTER — Ambulatory Visit
Admission: RE | Admit: 2012-06-16 | Discharge: 2012-06-16 | Disposition: A | Discharge status: Home / Self Care | Payer: Medicare Other | Source: Ambulatory Visit | Attending: Obstetrics & Gynecology | Admitting: Obstetrics & Gynecology

## 2012-06-16 DIAGNOSIS — Z1231 Encounter for screening mammogram for malignant neoplasm of breast: Secondary | ICD-10-CM

## 2012-06-24 ENCOUNTER — Ambulatory Visit (INDEPENDENT_AMBULATORY_CARE_PROVIDER_SITE_OTHER): Payer: Medicare Other | Admitting: Obstetrics & Gynecology

## 2012-06-24 ENCOUNTER — Other Ambulatory Visit (HOSPITAL_COMMUNITY)
Admission: RE | Admit: 2012-06-24 | Discharge: 2012-06-24 | Disposition: A | Discharge status: Home / Self Care | Payer: Medicare Other | Source: Ambulatory Visit | Attending: Obstetrics & Gynecology | Admitting: Obstetrics & Gynecology

## 2012-06-24 ENCOUNTER — Encounter: Payer: Self-pay | Admitting: Obstetrics & Gynecology

## 2012-06-24 VITALS — BP 98/47 | HR 76 | Ht 64.0 in | Wt 154.0 lb

## 2012-06-24 DIAGNOSIS — Z0000 Encounter for general adult medical examination without abnormal findings: Secondary | ICD-10-CM

## 2012-06-24 DIAGNOSIS — Z124 Encounter for screening for malignant neoplasm of cervix: Secondary | ICD-10-CM | POA: Diagnosis not present

## 2012-06-24 MED ORDER — ESTRADIOL ACETATE 0.05 MG/24HR VA RING: VAGINAL_RING | VAGINAL | Status: DC

## 2012-06-24 NOTE — Progress Notes (Signed)
Subjective:    Mary Gordon is a 66 y.o. female who presents for an annual exam. The patient has no complaints today. The patient is sexually active. GYN screening history: last pap: was normal. The patient wears seatbelts: yes. The patient participates in regular exercise: yes. (pickleball three times per week)  Has the patient ever been transfused or tattooed?: no. The patient reports that there is not domestic violence in her life.   Menstrual History: OB History    Grav Para Term Preterm Abortions TAB SAB Ect Mult Living   4 1              Menarche age: 77 No LMP recorded. Patient has had a hysterectomy.    The following portions of the patient's history were reviewed and updated as appropriate: allergies, current medications, past family history, past medical history, past social history, past surgical history and problem list.  Review of Systems A comprehensive review of systems was negative. Her mammogram, colonoscopy, and DEXA are all UTD.   Objective:    BP 98/47  Pulse 76  Ht 5\' 4"  (1.626 m)  Wt 154 lb (69.854 kg)  BMI 26.43 kg/m2  General Appearance:    Alert, cooperative, no distress, appears stated age  Head:    Normocephalic, without obvious abnormality, atraumatic  Eyes:    PERRL, conjunctiva/corneas clear, EOM's intact, fundi    benign, both eyes  Ears:    Normal TM's and external ear canals, both ears  Nose:   Nares normal, septum midline, mucosa normal, no drainage    or sinus tenderness  Throat:   Lips, mucosa, and tongue normal; teeth and gums normal  Neck:   Supple, symmetrical, trachea midline, no adenopathy;    thyroid:  no enlargement/tenderness/nodules; no carotid   bruit or JVD  Back:     Symmetric, no curvature, ROM normal, no CVA tenderness  Lungs:     Clear to auscultation bilaterally, respirations unlabored  Chest Wall:    No tenderness or deformity   Heart:    Regular rate and rhythm, S1 and S2 normal, no murmur, rub   or gallop  Breast Exam:     No tenderness, masses, or nipple abnormality  Abdomen:     Soft, non-tender, bowel sounds active all four quadrants,    no masses, no organomegaly  Genitalia:    Normal female without lesion, discharge or tenderness, cuff normal and no masses on bimanual exam     Extremities:   Extremities normal, atraumatic, no cyanosis or edema  Pulses:   2+ and symmetric all extremities  Skin:   Skin color, texture, turgor normal, no rashes or lesions  Lymph nodes:   Cervical, supraclavicular, and axillary nodes normal  Neurologic:   CNII-XII intact, normal strength, sensation and reflexes    throughout  .    Assessment:    Healthy female exam.    Plan:     Mammogram. Pap smear.  Refill [facility]

## 2012-09-16 DIAGNOSIS — H04129 Dry eye syndrome of unspecified lacrimal gland: Secondary | ICD-10-CM | POA: Diagnosis not present

## 2012-10-15 ENCOUNTER — Telehealth: Payer: Self-pay | Admitting: Family Medicine

## 2012-10-15 ENCOUNTER — Ambulatory Visit (INDEPENDENT_AMBULATORY_CARE_PROVIDER_SITE_OTHER): Payer: Medicare Other | Admitting: Family Medicine

## 2012-10-15 ENCOUNTER — Encounter: Payer: Self-pay | Admitting: Family Medicine

## 2012-10-15 VITALS — BP 100/62 | HR 78 | Temp 98.3°F | Ht 64.0 in | Wt 150.2 lb

## 2012-10-15 DIAGNOSIS — IMO0001 Reserved for inherently not codable concepts without codable children: Secondary | ICD-10-CM

## 2012-10-15 DIAGNOSIS — Z23 Encounter for immunization: Secondary | ICD-10-CM | POA: Diagnosis not present

## 2012-10-15 DIAGNOSIS — E7800 Pure hypercholesterolemia, unspecified: Secondary | ICD-10-CM | POA: Diagnosis not present

## 2012-10-15 DIAGNOSIS — M791 Myalgia, unspecified site: Secondary | ICD-10-CM

## 2012-10-15 LAB — LDL CHOLESTEROL, DIRECT: Direct LDL: 153 mg/dL

## 2012-10-15 LAB — TSH: TSH: 1.12 u[IU]/mL (ref 0.35–5.50)

## 2012-10-15 LAB — LIPID PANEL: Total CHOL/HDL Ratio: 4

## 2012-10-15 LAB — CBC WITH DIFFERENTIAL/PLATELET
Basophils Absolute: 0 10*3/uL (ref 0.0–0.1)
Eosinophils Absolute: 0.2 10*3/uL (ref 0.0–0.7)
Lymphocytes Relative: 34.8 % (ref 12.0–46.0)
MCHC: 32.4 g/dL (ref 30.0–36.0)
Monocytes Relative: 5.6 % (ref 3.0–12.0)
Neutrophils Relative %: 56.2 % (ref 43.0–77.0)
Platelets: 282 10*3/uL (ref 150.0–400.0)
RDW: 13.2 % (ref 11.5–14.6)

## 2012-10-15 LAB — SEDIMENTATION RATE: Sed Rate: 14 mm/hr (ref 0–22)

## 2012-10-15 LAB — CK: Total CK: 49 U/L (ref 7–177)

## 2012-10-15 NOTE — Telephone Encounter (Signed)
Caller: Thresa/Patient; Patient Name: Mary Gordon; PCP: Roxy Manns [facility]); Best Callback Phone Number: [phone]  10-14-12 had blood drawn today and has hematoma to area.  It is not dicolored yet.  Slightly tender to touch   All emergent sxs per Abrasions, Lacerations , Puncture Wounds ruled out  Home care advice given

## 2012-10-15 NOTE — Progress Notes (Signed)
Subjective:    Patient ID: Mary Gordon, female    DOB: 13-Nov-1946, 66 y.o.   MRN: [id number]  HPI Here for hyperlipidemia and also to get a flu shot  Got it today  She is doing well   Stopped her zocor at end of may - due to muscle aches and pains  Had been on and off of med for over 4 years  This time had been going on for 6 months - getting progressively worse  Stopping med did not seem to stop pain  Her pain was 6-7/10 before she stopped medicine  Now is a 3-4 / 10  Tends to be very stiff after inactivity  Has tried to exercise lately  Had lost 20 over a longer period of time   Pain is in neck/ upper shoulders/ back and legs  Dr Para March did a sed rate on her - to r/o pmr and that was normal  This week however - pain is worse  Lab Results  Component Value Date   ESRSEDRATE 12 05/31/2012      Lab Results  Component Value Date   CHOL 168 09/19/2011   CHOL 197 06/19/2011   CHOL 176 04/22/2010   Lab Results  Component Value Date   HDL 62.40 09/19/2011   HDL 63.90 06/19/2011   HDL 40.98 04/22/2010   Lab Results  Component Value Date   LDLCALC 90 09/19/2011   LDLCALC 119* 06/19/2011   LDLCALC 96 04/22/2010   Lab Results  Component Value Date   TRIG 79.0 09/19/2011   TRIG 73.0 06/19/2011   TRIG 77.0 04/22/2010   Lab Results  Component Value Date   CHOLHDL 3 09/19/2011   CHOLHDL 3 06/19/2011   CHOLHDL 3 04/22/2010   No results found for this basename: LDLDIRECT   on zocor and diet in past  Intol of zocor - muscle pain was really bad  Did not fast      Patient Active Problem List  Diagnosis  . UNSPECIFIED VITAMIN D DEFICIENCY  . HYPERCHOLESTEROLEMIA  . DEPRESSION  . ALLERGIC RHINITIS  . PULMONARY NODULE  . MENOPAUSAL SYNDROME  . CERVICAL RADICULOPATHY, RIGHT  . OSTEOPENIA  . FATIGUE  . SKIN RASH  . PALPITATIONS  . Chest pain  . GERD (gastroesophageal reflux disease)  . Hoarseness  . Right upper quadrant abdominal pain  . Special screening for  malignant neoplasms, colon  . Fatigue  . Myalgia  . Neck mass   Past Medical History  Diagnosis Date  . Palpitations   . High cholesterol   . Panic disorder   . Depression   . Osteopenia   . Vitamin D deficiency   . History of Clostridium difficile infection     In past from augmentin  . COPD (chronic obstructive pulmonary disease)     ? of on cxr in 9/09, then nl pfts with no obstr or restrictive dz noted  . Pulmonary nodule     Being observed  . Allergy     all year  . Cervical cancer 1978  . Cataract   . Anxiety   . GERD (gastroesophageal reflux disease)    Past Surgical History  Procedure Date  . Abdominal hysterectomy 1978  . Shoulder surgery 2008    Right shoulder bone spur  . Nasal hemorrhage control 2007  . Hand surgery 2005    Left hand thrombosis  . Colonoscopy 2002    Normal   History  Substance Use Topics  . Smoking status:  Former Smoker -- 0.8 packs/day for 16 years    Types: Cigarettes    Quit date: 12/08/1978  . Smokeless tobacco: Never Used     Comment: Started at age 26  . Alcohol Use: No   Family History  Problem Relation Age of Onset  . Heart attack Mother 53  . Emphysema Mother   . Allergies Mother   . Asthma Mother   . Allergies Father   . Arrhythmia Sister     Atrial fibrillation  . Cancer Sister     breast  . Diabetes Other     Parent  . Hyperlipidemia Other     Other relative  . Hypertension Other     Parent, other relative  . Glaucoma Other   . Breast cancer Other     Other relative, sister  . Allergies Other     Siblings  . Asthma Other     Sisters  . Emphysema Sister   . Heart disease Sister   . Rheumatologic disease Sister   . Lung cancer Sister    Allergies  Allergen Reactions  . Amoxicillin-Pot Clavulanate Other (See Comments)    Cdiff  . Simvastatin     myalgias   Current Outpatient Prescriptions on File Prior to Visit  Medication Sig Dispense Refill  . diphenhydrAMINE (BENADRYL) 25 MG tablet Take 25 mg  by mouth every 6 (six) hours as needed.      Marland Kitchen esomeprazole (NEXIUM) 40 MG capsule Take 40 mg by mouth as needed.       . Estradiol Acetate (FEMRING) 0.05 MG/24HR RING Change ring every 3 months.  1 each  4  . mometasone (NASONEX) 50 MCG/ACT nasal spray 2 sprays in each nostril once daily as needed.  17 g  11  . sertraline (ZOLOFT) 50 MG tablet TAKE 1 TABLET EVERY DAY  90 tablet  3  . traZODone (DESYREL) 100 MG tablet Take 1 tablet (100 mg total) by mouth at bedtime.  30 tablet  11      Review of Systems Review of Systems  Constitutional: Negative for fever, appetite change, fatigue and unexpected weight change.  Eyes: Negative for pain and visual disturbance.  Respiratory: Negative for cough and shortness of breath.   Cardiovascular: Negative for cp or palpitations    Gastrointestinal: Negative for nausea, diarrhea and constipation.  Genitourinary: Negative for urgency and frequency.  Skin: Negative for pallor or rash MSK pos for muscle pain/ joint pain, neg for joint redness or swelling    Neurological: Negative for weakness, light-headedness, numbness and headaches.  Hematological: Negative for adenopathy. Does not bruise/bleed easily.  Psychiatric/Behavioral: Negative for dysphoric mood. The patient is not nervous/anxious.         Objective:   Physical Exam  Constitutional: She is oriented to person, place, and time. She appears well-developed and well-nourished.  HENT:  Head: Normocephalic and atraumatic.  Mouth/Throat: Oropharynx is clear and moist.  Eyes: Conjunctivae normal and EOM are normal. Pupils are equal, round, and reactive to light. No scleral icterus.  Neck: Normal range of motion. Neck supple. No JVD present. No thyromegaly present.  Cardiovascular: Normal rate, regular rhythm, normal heart sounds and intact distal pulses.  Exam reveals no gallop.   Pulmonary/Chest: Effort normal and breath sounds normal. No respiratory distress. She has no wheezes.  Abdominal:  Soft. Bowel sounds are normal. She exhibits no mass.  Musculoskeletal: She exhibits tenderness. She exhibits no edema.       Some general myofascial tenderness- in  large muscle groups  No joint swelling or tenderness or deformity  Lymphadenopathy:    She has no cervical adenopathy.  Neurological: She is alert and oriented to person, place, and time. She has normal reflexes. No cranial nerve deficit. She exhibits normal muscle tone. Coordination normal.  Skin: Skin is warm and dry. No rash noted. No erythema. No pallor.  Psychiatric: She has a normal mood and affect.          Assessment & Plan:

## 2012-10-15 NOTE — Patient Instructions (Addendum)
Avoid red meat/ fried foods/ egg yolks/ fatty breakfast meats/ butter, cheese and high fat dairy/ and shellfish   Cholesterol check today Flu shot today Keep exercising - low impact if necessary  Will do some labs for widespread pain

## 2012-10-16 LAB — RHEUMATOID FACTOR: Rhuematoid fact SerPl-aCnc: <10 IU/mL (ref <=14)

## 2012-10-17 NOTE — Assessment & Plan Note (Signed)
Ongoing- only slt imp after stopping statin Nl ESR in past - pt still has questions about PMR or other causes No s/s of temporal arteritis however Will do repeat sed rate as well as other labs and update

## 2012-10-17 NOTE — Assessment & Plan Note (Signed)
Pt off some statin with only a little imp of mylagias and arthralgias  Disc low sat fat diet

## 2012-10-19 LAB — ANA: Anti Nuclear Antibody(ANA): NEGATIVE

## 2012-10-20 ENCOUNTER — Telehealth: Payer: Self-pay | Admitting: Family Medicine

## 2012-10-20 DIAGNOSIS — M791 Myalgia, unspecified site: Secondary | ICD-10-CM

## 2012-10-20 NOTE — Telephone Encounter (Signed)
Message copied by Judy Pimple on Wed Oct 20, 2012  1:19 PM ------      Message from: Shon Millet      Created: Wed Oct 20, 2012 11:02 AM       Pt notified and agrees with rheumatologist referral

## 2012-11-15 DIAGNOSIS — M545 Low back pain: Secondary | ICD-10-CM | POA: Diagnosis not present

## 2012-11-15 DIAGNOSIS — IMO0001 Reserved for inherently not codable concepts without codable children: Secondary | ICD-10-CM | POA: Diagnosis not present

## 2012-11-15 DIAGNOSIS — M19049 Primary osteoarthritis, unspecified hand: Secondary | ICD-10-CM | POA: Diagnosis not present

## 2012-11-18 ENCOUNTER — Other Ambulatory Visit: Payer: Self-pay | Admitting: *Deleted

## 2012-11-18 MED ORDER — TRAZODONE HCL 100 MG PO TABS: 100.0000 mg | ORAL_TABLET | Freq: Every day | ORAL | Status: DC

## 2012-11-18 NOTE — Telephone Encounter (Signed)
Please refil for a year-thanks

## 2013-01-12 DIAGNOSIS — H9209 Otalgia, unspecified ear: Secondary | ICD-10-CM | POA: Diagnosis not present

## 2013-01-12 DIAGNOSIS — H905 Unspecified sensorineural hearing loss: Secondary | ICD-10-CM | POA: Diagnosis not present

## 2013-01-12 DIAGNOSIS — H93299 Other abnormal auditory perceptions, unspecified ear: Secondary | ICD-10-CM | POA: Diagnosis not present

## 2013-03-10 ENCOUNTER — Ambulatory Visit (INDEPENDENT_AMBULATORY_CARE_PROVIDER_SITE_OTHER): Payer: Medicare Other | Admitting: Family Medicine

## 2013-03-10 ENCOUNTER — Encounter: Payer: Self-pay | Admitting: Family Medicine

## 2013-03-10 VITALS — BP 118/72 | HR 72 | Temp 98.3°F | Ht 64.0 in | Wt 148.5 lb

## 2013-03-10 DIAGNOSIS — M65839 Other synovitis and tenosynovitis, unspecified forearm: Secondary | ICD-10-CM

## 2013-03-10 DIAGNOSIS — M65849 Other synovitis and tenosynovitis, unspecified hand: Secondary | ICD-10-CM

## 2013-03-10 DIAGNOSIS — M659 Synovitis and tenosynovitis, unspecified: Secondary | ICD-10-CM

## 2013-03-10 MED ORDER — MOMETASONE FUROATE 50 MCG/ACT NA SUSP: NASAL | Status: DC

## 2013-03-10 NOTE — Progress Notes (Signed)
[facility] [address] [place] [address] Phone: [phone] Fax: [phone]  Date:  03/10/2013   Name:  Mary Gordon   DOB:  July 18, 1946   MRN:  [id number] Gender: female Age: 67 y.o.  Primary Physician:  Roxy Manns, MD  Evaluating MD: Hannah Beat, MD   Chief Complaint: left hand pain   History of Present Illness:  Mary Gordon is a 67 y.o. pleasant patient who presents with the following:  Was helping someone move some things - about a week and a half ago - 3rd on the L. Now there is some pain and nodule on the L. ? If nodule is new. No swelling, bruising.  tenosynovitis  Patient Active Problem List  Diagnosis  . UNSPECIFIED VITAMIN D DEFICIENCY  . HYPERCHOLESTEROLEMIA  . DEPRESSION  . ALLERGIC RHINITIS  . PULMONARY NODULE  . MENOPAUSAL SYNDROME  . CERVICAL RADICULOPATHY, RIGHT  . OSTEOPENIA  . FATIGUE  . SKIN RASH  . PALPITATIONS  . Chest pain  . GERD (gastroesophageal reflux disease)  . Hoarseness  . Right upper quadrant abdominal pain  . Special screening for malignant neoplasms, colon  . Fatigue  . Myalgia  . Neck mass  . Muscle pain    Past Medical History  Diagnosis Date  . Palpitations   . High cholesterol   . Panic disorder   . Depression   . Osteopenia   . Vitamin D deficiency   . History of Clostridium difficile infection     In past from augmentin  . COPD (chronic obstructive pulmonary disease)     ? of on cxr in 9/09, then nl pfts with no obstr or restrictive dz noted  . Pulmonary nodule     Being observed  . Allergy     all year  . Cervical cancer 1978  . Cataract   . Anxiety   . GERD (gastroesophageal reflux disease)     Past Surgical History  Procedure Laterality Date  . Abdominal hysterectomy  1978  . Shoulder surgery  2008    Right shoulder bone spur  . Nasal hemorrhage control  2007  . 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 Colonoscopy  2002    Normal    History     Social History  . Marital Status: Married    Spouse Name: N/A    Number of Children: N/A  . Years of Education: N/A   Occupational History  . Retired from book keeping    Social History Main Topics  . Smoking status: Former Smoker -- 0.80 packs/day for 16 years    Types: Cigarettes    Quit date: 12/08/1978  . Smokeless tobacco: Never Used     Comment: Started at age 54  . Alcohol Use: No  . Drug Use: No  . Sexually Active: Yes -- Female partner(s)   Other Topics Concern  . Not on file   Social History Narrative   Married (husband is colon cancer survivor)   Does not get regular exercise   G4P1    Family History  Problem Relation Age of Onset  . Heart attack Mother 34  . Emphysema Mother   . Allergies Mother   . Asthma Mother   . Allergies Father   . Arrhythmia Sister     Atrial fibrillation  . Cancer Sister     breast  . Diabetes Other     Parent  . Hyperlipidemia Other  Other relative  . Hypertension Other     Parent, other relative  . Glaucoma Other   . Breast cancer Other     Other relative, sister  . Allergies Other     Siblings  . Asthma Other     Sisters  . Emphysema Sister   . Heart disease Sister   . Rheumatologic disease Sister   . Lung cancer Sister     Allergies  Allergen Reactions  . Amoxicillin-Pot Clavulanate Other (See Comments)    Cdiff  . Simvastatin     myalgias    Medication list has been reviewed and updated.  Outpatient Prescriptions Prior to Visit  Medication Sig Dispense Refill  . diphenhydrAMINE (BENADRYL) 25 MG tablet Take 25 mg by mouth every 6 (six) hours as needed.      . Estradiol Acetate (FEMRING) 0.05 MG/24HR RING Change ring every 3 months.  1 each  4  . sertraline (ZOLOFT) 50 MG tablet TAKE 1 TABLET EVERY DAY  90 tablet  3  . traZODone (DESYREL) 100 MG tablet Take 1 tablet (100 mg total) by mouth at bedtime.  30 tablet  11  . mometasone (NASONEX) 50 MCG/ACT nasal spray 2 sprays in each nostril once daily  as needed.  17 g  11  . esomeprazole (NEXIUM) 40 MG capsule Take 40 mg by mouth as needed.        No facility-administered medications prior to visit.    Review of Systems:   GEN: No fevers, chills. Nontoxic. Primarily MSK c/o today. MSK: Detailed in the HPI GI: tolerating PO intake without difficulty Neuro: No numbness, parasthesias, or tingling associated. Otherwise the pertinent positives of the ROS are noted above.    Physical Examination: BP 118/72  Pulse 72  Temp(Src) 98.3 F (36.8 C) (Oral)  Ht 5\' 4"  (1.626 m)  Wt 148 lb 8 oz (67.359 kg)  BMI 25.48 kg/m2  SpO2 97%  Ideal Body Weight: Weight in (lb) to have BMI = 25: 145.3   GEN: WDWN, NAD, Non-toxic, Alert & Oriented x 3 HEENT: Atraumatic, Normocephalic.  Ears and Nose: No external deformity. EXTR: No clubbing/cyanosis/edema NEURO: Normal gait.  PSYCH: Normally interactive. Conversant. Not depressed or anxious appearing.  Calm demeanor.   L hand Ecchymosis or edema: neg ROM wrist/hand/digits: full  Carpals, MCP's, digits: NT Distal Ulna and Radius: NT Ecchymosis or edema: neg No instability Cysts/nodules: 1st and 3rd - 3rd is mildly tender. Diffuse OA changes Digit triggering: neg Finkelstein's test: neg Snuffbox tenderness: neg Scaphoid tubercle: NT Resisted supination: NT Full composite fist, no malrotation Grip, all digits: 5/5 str DIPJT: NT PIP JT: NT MCP JT: NT Axial load test: neg Atrophy: neg  Hand sensation: intact   Assessment and Plan: Tenosynovitis of finger and hand   Reassured 3rd flexor tenosynovitis. Ice, alleve bid. If not better in a month, i could inject sheath, but most will resolve conservatively.  Signed, Elpidio Galea. Kenyanna Grzesiak, MD 03/10/2013 10:03 AM

## 2013-06-17 ENCOUNTER — Other Ambulatory Visit: Payer: Self-pay | Admitting: Family Medicine

## 2013-06-17 NOTE — Telephone Encounter (Signed)
Please refill for 6 mo 

## 2013-06-17 NOTE — Telephone Encounter (Signed)
Fax refill request, please advise  

## 2013-06-20 MED ORDER — SERTRALINE HCL 50 MG PO TABS: ORAL_TABLET | ORAL | Status: DC

## 2013-06-29 ENCOUNTER — Other Ambulatory Visit: Payer: Self-pay

## 2013-06-29 DIAGNOSIS — Z1231 Encounter for screening mammogram for malignant neoplasm of breast: Secondary | ICD-10-CM

## 2013-07-06 ENCOUNTER — Other Ambulatory Visit: Payer: Self-pay | Admitting: Obstetrics & Gynecology

## 2013-07-11 ENCOUNTER — Ambulatory Visit
Admission: RE | Admit: 2013-07-11 | Discharge: 2013-07-11 | Disposition: A | Discharge status: Home / Self Care | Payer: Medicare Other | Source: Ambulatory Visit

## 2013-07-11 DIAGNOSIS — Z1231 Encounter for screening mammogram for malignant neoplasm of breast: Secondary | ICD-10-CM | POA: Diagnosis not present

## 2013-07-13 ENCOUNTER — Other Ambulatory Visit: Payer: Self-pay

## 2013-07-15 ENCOUNTER — Ambulatory Visit (INDEPENDENT_AMBULATORY_CARE_PROVIDER_SITE_OTHER): Payer: Medicare Other | Admitting: Obstetrics & Gynecology

## 2013-07-15 ENCOUNTER — Encounter: Payer: Self-pay | Admitting: Obstetrics & Gynecology

## 2013-07-15 VITALS — BP 110/60 | HR 78 | Ht 64.0 in | Wt 142.0 lb

## 2013-07-15 DIAGNOSIS — E2839 Other primary ovarian failure: Secondary | ICD-10-CM

## 2013-07-15 MED ORDER — ESTRADIOL 1 MG PO TABS: 1.0000 mg | ORAL_TABLET | Freq: Every day | ORAL | Status: DC

## 2013-07-15 NOTE — Progress Notes (Signed)
  Subjective:    Patient ID: Mary Gordon, female    DOB: 05-Nov-1946, 67 y.o.   MRN: [id number]  HPI  67 yo MW lady who is here to discuss her Femring. She says that is costs her $250 per 3 months and she cannot afford this, although she is very happy with it. She has been on oral estrogen in the past. She is sexually active and would like to maintain her happy vagina status.  Review of Systems     Objective:   Physical Exam        Assessment & Plan:   ERT- change to estradiol 1 mg Will increase if her needs are not met with this dose.

## 2013-08-30 DIAGNOSIS — L819 Disorder of pigmentation, unspecified: Secondary | ICD-10-CM | POA: Diagnosis not present

## 2013-08-30 DIAGNOSIS — L299 Pruritus, unspecified: Secondary | ICD-10-CM | POA: Diagnosis not present

## 2013-08-30 DIAGNOSIS — L259 Unspecified contact dermatitis, unspecified cause: Secondary | ICD-10-CM | POA: Diagnosis not present

## 2013-08-30 DIAGNOSIS — R21 Rash and other nonspecific skin eruption: Secondary | ICD-10-CM | POA: Diagnosis not present

## 2013-08-30 DIAGNOSIS — L820 Inflamed seborrheic keratosis: Secondary | ICD-10-CM | POA: Diagnosis not present

## 2013-08-30 DIAGNOSIS — L821 Other seborrheic keratosis: Secondary | ICD-10-CM | POA: Diagnosis not present

## 2013-09-06 ENCOUNTER — Ambulatory Visit (INDEPENDENT_AMBULATORY_CARE_PROVIDER_SITE_OTHER): Payer: Medicare Other | Admitting: Family Medicine

## 2013-09-06 ENCOUNTER — Encounter: Payer: Self-pay | Admitting: Family Medicine

## 2013-09-06 VITALS — BP 108/66 | HR 77 | Temp 98.1°F | Ht 64.0 in | Wt 146.5 lb

## 2013-09-06 DIAGNOSIS — Z23 Encounter for immunization: Secondary | ICD-10-CM | POA: Diagnosis not present

## 2013-09-06 DIAGNOSIS — S81009A Unspecified open wound, unspecified knee, initial encounter: Secondary | ICD-10-CM

## 2013-09-06 DIAGNOSIS — S81811A Laceration without foreign body, right lower leg, initial encounter: Secondary | ICD-10-CM

## 2013-09-06 MED ORDER — CEPHALEXIN 500 MG PO CAPS: 500.0000 mg | ORAL_CAPSULE | Freq: Three times a day (TID) | ORAL | Status: DC

## 2013-09-06 NOTE — Patient Instructions (Addendum)
Keep wound clean and dry (soap and water) Continue antibiotic ointment and loose dressing  Take the keflex as directed If worse - swelling / pain / fever or if not improving   Flu vaccine today   Schedule annual exam for winter or spring with labs prior

## 2013-09-06 NOTE — Progress Notes (Signed)
Subjective:    Patient ID: Mary Gordon, female    DOB: 11-Aug-1946, 67 y.o.   MRN: [id number]  HPI Here with a wound on R lower leg 8 d ago - skin tear from car door - and she flattened it out  Now a little more swollen Bruising surrounds it  Is aching now also   She is not on any asa or blood thinners No fever   Wants her flu shot also  Patient Active Problem List   Diagnosis Date Noted  . Muscle pain 10/15/2012  . Fatigue 05/31/2012  . Myalgia 05/31/2012  . Neck mass 05/31/2012  . Right upper quadrant abdominal pain 09/26/2011  . Special screening for malignant neoplasms, colon 09/26/2011  . Hoarseness 07/25/2011  . GERD (gastroesophageal reflux disease) 07/08/2011  . Chest pain 06/18/2011  . CERVICAL RADICULOPATHY, RIGHT 09/04/2010  . FATIGUE 04/22/2010  . SKIN RASH 04/22/2010  . PULMONARY NODULE 09/14/2009  . PALPITATIONS 08/17/2009  . UNSPECIFIED VITAMIN D DEFICIENCY 02/22/2009  . HYPERCHOLESTEROLEMIA 02/22/2009  . DEPRESSION 02/22/2009  . ALLERGIC RHINITIS 02/22/2009  . MENOPAUSAL SYNDROME 02/22/2009  . OSTEOPENIA 02/22/2009   Past Medical History  Diagnosis Date  . Palpitations   . High cholesterol   . Panic disorder   . Depression   . Osteopenia   . Vitamin D deficiency   . History of Clostridium difficile infection     In past from augmentin  . COPD (chronic obstructive pulmonary disease)     ? of on cxr in 9/09, then nl pfts with no obstr or restrictive dz noted  . Pulmonary nodule     Being observed  . Allergy     all year  . Cervical cancer 1978  . Cataract   . Anxiety   . GERD (gastroesophageal reflux disease)   . Fibromyalgia    Past Surgical History  Procedure Laterality Date  . Abdominal hysterectomy  1978  . Shoulder surgery  2008    Right shoulder bone spur  . Nasal hemorrhage control  2007  . 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 Colonoscopy  2002    Normal   History  Substance Use Topics  . Smoking status:  Former Smoker -- 0.80 packs/day for 16 years    Types: Cigarettes    Quit date: 12/08/1978  . Smokeless tobacco: Never Used     Comment: Started at age 74  . Alcohol Use: No   Family History  Problem Relation Age of Onset  . Heart attack Mother 75  . Emphysema Mother   . Allergies Mother   . Asthma Mother   . Allergies Father   . Arrhythmia Sister     Atrial fibrillation  . Cancer Sister     breast  . Diabetes Other     Parent  . Hyperlipidemia Other     Other relative  . Hypertension Other     Parent, other relative  . Glaucoma Other   . Breast cancer Other     Other relative, sister  . Allergies Other     Siblings  . Asthma Other     Sisters  . Emphysema Sister   . Heart disease Sister   . Rheumatologic disease Sister   . Lung cancer Sister    Allergies  Allergen Reactions  . Amoxicillin-Pot Clavulanate Other (See Comments)    Cdiff  . Simvastatin     myalgias   Current Outpatient Prescriptions on File Prior to Visit  Medication Sig Dispense Refill  . diphenhydrAMINE (BENADRYL) 25 MG tablet Take 25 mg by mouth every 6 (six) hours as needed.      Marland Kitchen esomeprazole (NEXIUM) 20 MG capsule Take 20 mg by mouth daily before breakfast.      . estradiol (ESTRACE) 1 MG tablet Take 1 tablet (1 mg total) by mouth daily.  100 tablet  5  . mometasone (NASONEX) 50 MCG/ACT nasal spray 2 sprays in each nostril once daily as needed.  17 g  11  . sertraline (ZOLOFT) 50 MG tablet TAKE 1 TABLET EVERY DAY  90 tablet  1  . traZODone (DESYREL) 100 MG tablet Take 1 tablet (100 mg total) by mouth at bedtime.  30 tablet  11   No current facility-administered medications on file prior to visit.     Review of Systems Review of Systems  Constitutional: Negative for fever, appetite change, fatigue and unexpected weight change.  Eyes: Negative for pain and visual disturbance.  Respiratory: Negative for cough and shortness of breath.   Cardiovascular: Negative for cp or palpitations     Gastrointestinal: Negative for nausea, diarrhea and constipation.  Genitourinary: Negative for urgency and frequency.  Skin: Negative for pallor or rash   Neurological: Negative for weakness, light-headedness, numbness and headaches.  Hematological: Negative for adenopathy. Does not bruise/bleed easily.  Psychiatric/Behavioral: Negative for dysphoric mood. The patient is not nervous/anxious.         Objective:   Physical Exam  Constitutional: She appears well-developed and well-nourished. No distress.  Eyes: Conjunctivae and EOM are normal. Pupils are equal, round, and reactive to light.  Cardiovascular: Normal rate, regular rhythm and intact distal pulses.   Neurological: She is alert. She displays no atrophy. No sensory deficit. She exhibits normal muscle tone.  Skin: Skin is warm and dry. No rash noted. There is erythema. No pallor.  r lower lateral leg: "U" shaped skin tear with slt blister formation- good wound edge approx and only 1-2 mm of surrounding erythema , no drainage Soft tissue swelling 3-4 cm surrounding with old ecchymosis and mild tenderness           Assessment & Plan:

## 2013-09-06 NOTE — Assessment & Plan Note (Signed)
Given worsening of soft tissue swelling and pain - will cover with keflex for 7 days Disc symptomatic care - see instructions on AVS  Disc wound care Update if not starting to improve in a week or if worsening - would want xray if any worsening

## 2013-10-19 DIAGNOSIS — H2510 Age-related nuclear cataract, unspecified eye: Secondary | ICD-10-CM | POA: Diagnosis not present

## 2013-10-24 ENCOUNTER — Encounter: Payer: Self-pay | Admitting: Family Medicine

## 2013-10-24 ENCOUNTER — Ambulatory Visit (INDEPENDENT_AMBULATORY_CARE_PROVIDER_SITE_OTHER): Payer: Medicare Other | Admitting: Family Medicine

## 2013-10-24 VITALS — BP 106/64 | HR 80 | Temp 97.3°F | Ht 64.0 in | Wt 146.0 lb

## 2013-10-24 DIAGNOSIS — E7800 Pure hypercholesterolemia, unspecified: Secondary | ICD-10-CM

## 2013-10-24 DIAGNOSIS — K219 Gastro-esophageal reflux disease without esophagitis: Secondary | ICD-10-CM | POA: Diagnosis not present

## 2013-10-24 DIAGNOSIS — E559 Vitamin D deficiency, unspecified: Secondary | ICD-10-CM

## 2013-10-24 LAB — COMPREHENSIVE METABOLIC PANEL
ALT: 13 U/L (ref 0–35)
AST: 17 U/L (ref 0–37)
Albumin: 3.9 g/dL (ref 3.5–5.2)
Alkaline Phosphatase: 64 U/L (ref 39–117)
BUN: 11 mg/dL (ref 6–23)
Calcium: 9.1 mg/dL (ref 8.4–10.5)
Chloride: 103 mEq/L (ref 96–112)
Glucose, Bld: 100 mg/dL — ABNORMAL HIGH (ref 70–99)
Potassium: 4.6 mEq/L (ref 3.5–5.1)
Sodium: 137 mEq/L (ref 135–145)
Total Protein: 6.9 g/dL (ref 6.0–8.3)

## 2013-10-24 LAB — LIPID PANEL: VLDL: 17.6 mg/dL (ref 0.0–40.0)

## 2013-10-24 LAB — LDL CHOLESTEROL, DIRECT: Direct LDL: 161.5 mg/dL

## 2013-10-24 MED ORDER — ESOMEPRAZOLE MAGNESIUM 40 MG PO CPDR: 40.0000 mg | DELAYED_RELEASE_CAPSULE | Freq: Every day | ORAL | Status: DC

## 2013-10-24 NOTE — Assessment & Plan Note (Signed)
Lab today.

## 2013-10-24 NOTE — Progress Notes (Signed)
Pre-visit discussion using our clinic review tool. No additional management support is needed unless otherwise documented below in the visit note.  

## 2013-10-24 NOTE — Assessment & Plan Note (Signed)
Worse lately- despite no change in lifestyle or wt  Having some upper resp symptoms incl hoarseness Also dysphagia /solids  Inc nexium to 40 mg daily  GERD diet handout Ref to GI for eval -consider EGD

## 2013-10-24 NOTE — Assessment & Plan Note (Signed)
Lab today  On supplement Inc risk of OP due to PPI

## 2013-10-24 NOTE — Progress Notes (Signed)
Subjective:    Patient ID: Mary Gordon, female    DOB: 1946/07/27, 67 y.o.   MRN: [id number]  HPI Here with acid reflux symptoms   Lately she has more symptoms - in chest / heartburn Also chronically coughing and clearing throat and hoarseness  Sometimes even has trouble swallowing- feels like food gets stuck   Is on nexium -- 20 mg   She had a sister with barretts esophagus   No stool changes Has IBS   Patient Active Problem List   Diagnosis Date Noted  . Skin tear of right lower leg without complication 09/06/2013  . Muscle pain 10/15/2012  . Fatigue 05/31/2012  . Myalgia 05/31/2012  . Neck mass 05/31/2012  . Right upper quadrant abdominal pain 09/26/2011  . Special screening for malignant neoplasms, colon 09/26/2011  . Hoarseness 07/25/2011  . GERD (gastroesophageal reflux disease) 07/08/2011  . Chest pain 06/18/2011  . CERVICAL RADICULOPATHY, RIGHT 09/04/2010  . FATIGUE 04/22/2010  . SKIN RASH 04/22/2010  . PULMONARY NODULE 09/14/2009  . PALPITATIONS 08/17/2009  . UNSPECIFIED VITAMIN D DEFICIENCY 02/22/2009  . HYPERCHOLESTEROLEMIA 02/22/2009  . DEPRESSION 02/22/2009  . ALLERGIC RHINITIS 02/22/2009  . MENOPAUSAL SYNDROME 02/22/2009  . OSTEOPENIA 02/22/2009   Past Medical History  Diagnosis Date  . Palpitations   . High cholesterol   . Panic disorder   . Depression   . Osteopenia   . Vitamin D deficiency   . History of Clostridium difficile infection     In past from augmentin  . COPD (chronic obstructive pulmonary disease)     ? of on cxr in 9/09, then nl pfts with no obstr or restrictive dz noted  . Pulmonary nodule     Being observed  . Allergy     all year  . Cervical cancer 1978  . Cataract   . Anxiety   . GERD (gastroesophageal reflux disease)   . Fibromyalgia    Past Surgical History  Procedure Laterality Date  . Abdominal hysterectomy  1978  . Shoulder surgery  2008    Right shoulder bone spur  . Nasal hemorrhage control  2007  .  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 Colonoscopy  2002    Normal   History  Substance Use Topics  . Smoking status: Former Smoker -- 0.80 packs/day for 16 years    Types: Cigarettes    Quit date: 12/08/1978  . Smokeless tobacco: Never Used     Comment: Started at age 84  . Alcohol Use: No   Family History  Problem Relation Age of Onset  . Heart attack Mother 54  . Emphysema Mother   . Allergies Mother   . Asthma Mother   . Allergies Father   . Arrhythmia Sister     Atrial fibrillation  . Cancer Sister     breast  . Diabetes Other     Parent  . Hyperlipidemia Other     Other relative  . Hypertension Other     Parent, other relative  . Glaucoma Other   . Breast cancer Other     Other relative, sister  . Allergies Other     Siblings  . Asthma Other     Sisters  . Emphysema Sister   . Heart disease Sister   . Rheumatologic disease Sister   . Lung cancer Sister    Allergies  Allergen Reactions  . Amoxicillin-Pot Clavulanate Other (See Comments)    Cdiff  . Simvastatin  myalgias   Current Outpatient Prescriptions on File Prior to Visit  Medication Sig Dispense Refill  . diphenhydrAMINE (BENADRYL) 25 MG tablet Take 25 mg by mouth every 6 (six) hours as needed.      Marland Kitchen esomeprazole (NEXIUM) 20 MG capsule Take 20 mg by mouth daily before breakfast.      . estradiol (ESTRACE) 1 MG tablet Take 1 tablet (1 mg total) by mouth daily.  100 tablet  5  . mometasone (NASONEX) 50 MCG/ACT nasal spray 2 sprays in each nostril once daily as needed.  17 g  11  . sertraline (ZOLOFT) 50 MG tablet TAKE 1 TABLET EVERY DAY  90 tablet  1  . traZODone (DESYREL) 100 MG tablet Take 1 tablet (100 mg total) by mouth at bedtime.  30 tablet  11   No current facility-administered medications on file prior to visit.         Review of Systems Review of Systems  Constitutional: Negative for fever, appetite change, fatigue and unexpected weight change.  ENT pos for globus sens  and hoarseness and dysphagia  Eyes: Negative for pain and visual disturbance.  Respiratory: Negative for cough and shortness of breath.   Cardiovascular: Negative for cp or palpitations    Gastrointestinal: Negative for nausea, diarrhea and constipation. pos for heartburn and epigastric discomfort Genitourinary: Negative for urgency and frequency.  Skin: Negative for pallor or rash   Neurological: Negative for weakness, light-headedness, numbness and headaches.  Hematological: Negative for adenopathy. Does not bruise/bleed easily.  Psychiatric/Behavioral: Negative for dysphoric mood. The patient is not nervous/anxious.         Objective:   Physical Exam  Constitutional: She appears well-developed and well-nourished. No distress.  HENT:  Head: Normocephalic and atraumatic.  Mouth/Throat: Oropharynx is clear and moist.  Pt does clear her throat often  Eyes: Conjunctivae and EOM are normal. Pupils are equal, round, and reactive to light.  Neck: Normal range of motion. Neck supple. No JVD present. No thyromegaly present.  Cardiovascular: Normal rate and regular rhythm.   Pulmonary/Chest: Effort normal and breath sounds normal. No respiratory distress. She has no wheezes.  Abdominal: Soft. Bowel sounds are normal. She exhibits no distension and no mass. There is tenderness. There is no rebound and no guarding.  Very mild epigastric tenderness  Musculoskeletal: She exhibits no edema.  Lymphadenopathy:    She has no cervical adenopathy.  Neurological: She is alert.  Skin: Skin is warm and dry. No pallor.  Psychiatric: She has a normal mood and affect.          Assessment & Plan:

## 2013-10-24 NOTE — Patient Instructions (Signed)
Increase nexium to 40 mg once daily  Stop up front for referral to GI  If worse- let me know

## 2013-10-26 ENCOUNTER — Ambulatory Visit (INDEPENDENT_AMBULATORY_CARE_PROVIDER_SITE_OTHER): Payer: Medicare Other | Admitting: Internal Medicine

## 2013-10-26 ENCOUNTER — Encounter: Payer: Self-pay | Admitting: Internal Medicine

## 2013-10-26 VITALS — BP 104/60 | HR 72 | Ht 64.0 in | Wt 145.2 lb

## 2013-10-26 DIAGNOSIS — K219 Gastro-esophageal reflux disease without esophagitis: Secondary | ICD-10-CM | POA: Diagnosis not present

## 2013-10-26 DIAGNOSIS — M76899 Other specified enthesopathies of unspecified lower limb, excluding foot: Secondary | ICD-10-CM | POA: Diagnosis not present

## 2013-10-26 DIAGNOSIS — M2550 Pain in unspecified joint: Secondary | ICD-10-CM | POA: Diagnosis not present

## 2013-10-26 DIAGNOSIS — M545 Low back pain: Secondary | ICD-10-CM | POA: Diagnosis not present

## 2013-10-26 MED ORDER — OMEPRAZOLE 40 MG PO CPDR: 40.0000 mg | DELAYED_RELEASE_CAPSULE | Freq: Two times a day (BID) | ORAL | Status: DC

## 2013-10-26 NOTE — Progress Notes (Signed)
Mary Gordon [birth year] MRN [id number]  History of Present Illness:  This is a 67 year old white female with persistent burning sensation in her chest which occurs mostly postprandially but also at night. There has been no dysphagia. She has known gastroesophageal reflux which has been controlled with Nexium 20 mg a day for many years. Recently, symptoms have progressed to where she has hoarseness, clearing of the throat, nocturnal cough and sore throat. Her half-sister has Barrett's esophagus. Patient drinks 2 cups of coffee a day. She doesn't smoke. Her weight has decreased since she started exercising. 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 She denies excessive stress. She is on Zoloft every day and Desyrel at night. She has a history of intermittent right upper quadrant discomfort. An upper abdominal ultrasound was negative and her colonoscopy showed a redundant colon.   Past Medical History  Diagnosis Date  . Palpitations   . High cholesterol   . Panic disorder   . Depression   . Osteopenia   . Vitamin D deficiency   . History of Clostridium difficile infection     In past from augmentin  . COPD (chronic obstructive pulmonary disease)     ? of on cxr in 9/09, then nl pfts with no obstr or restrictive dz noted  . Pulmonary nodule     Being observed  . Allergy     all year  . Cervical cancer 1978  . Cataract   . Anxiety   . GERD (gastroesophageal reflux disease)   . Fibromyalgia   . Bursitis of hip    Past Surgical History  Procedure Laterality Date  . Abdominal hysterectomy  1978  . Shoulder surgery  2008    Right shoulder bone spur  . Nasal hemorrhage control  2007  . 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 Colonoscopy  2002    Normal    reports that she quit smoking about 34 years ago. Her smoking use included Cigarettes. She has a 12.8 pack-year smoking history. She has never used smokeless tobacco. She reports that she does not drink  alcohol or use illicit drugs. family history includes Allergies in her father, mother, and other; Arrhythmia in her sister; Asthma in her mother and other; Breast cancer in her other; Cancer in her sister; Diabetes in her other; Emphysema in her mother and sister; Glaucoma in her other; Heart attack (age of onset: 71) in her mother; Heart disease in her sister; Hyperlipidemia in her other; Hypertension in her other; Lung cancer in her sister; Rheumatologic disease in her sister. Allergies  Allergen Reactions  . Amoxicillin-Pot Clavulanate Other (See Comments)    Cdiff  . Simvastatin     myalgias        Review of Systems: Denies lower GI symptoms. Last colonoscopy in November 2012 showed redundant colon  The remainder of the 10 point ROS is negative except as outlined in H&P   Physical Exam: General appearance  Well developed, in no distress. Eyes- non icteric. HEENT nontraumatic, normocephalic. Mouth no lesions, tongue papillated, no cheilosis. Clears her throat often Neck supple without adenopathy, thyroid not enlarged, no carotid bruits, no JVD. Lungs Clear to auscultation bilaterally. Cor normal S1, normal S2, regular rhythm, no murmur,  quiet precordium. Abdomen: Soft nontender with normoactive bowel sounds. No hernia. Liver edge at costal margin. Rectal: Not done. Extremities no pedal edema. Skin no lesions. Neurological alert and oriented x 3. Psychological normal mood and affect.  Assessment and Plan:  Problem #1 Worsening cough and clearing of the throat could be a result of chronic gastroesophageal reflux but also could be caused by allergies. She is having signs of increasing gastroesophageal reflux as suggested by postprandial burning and dyspepsia. Since she is not responsive to oral proton pump inhibitors, we will proceed with an upper endoscopy and biopsies to rule out Barrett's esophagus. She may have a large hiatal hernia. We will increase her proton pump inhibitor  to Prilosec 40 mg twice a day, as her insurance will not cover Nexium and she couldn't afford it when it was prescribed for her in the past..   10/26/2013 [name]

## 2013-10-26 NOTE — Patient Instructions (Signed)
You have been scheduled for an endoscopy with propofol. Please follow written instructions given to you at your visit today. If you use inhalers (even only as needed), please bring them with you on the day of your procedure. Your physician has requested that you go to www.startemmi.com and enter the access code given to you at your visit today. This web site gives a general overview about your procedure. However, you should still follow specific instructions given to you by our office regarding your preparation for the procedure.  We have sent the following medications to your pharmacy for you to pick up at your convenience: Prilosec 40 mg twice daily (in place of Nexium)  Cc: Dr Milinda Antis

## 2013-11-01 DIAGNOSIS — M2550 Pain in unspecified joint: Secondary | ICD-10-CM | POA: Diagnosis not present

## 2013-11-01 DIAGNOSIS — M545 Low back pain: Secondary | ICD-10-CM | POA: Diagnosis not present

## 2013-11-01 DIAGNOSIS — M76899 Other specified enthesopathies of unspecified lower limb, excluding foot: Secondary | ICD-10-CM | POA: Diagnosis not present

## 2013-11-10 ENCOUNTER — Encounter: Payer: Self-pay | Admitting: Internal Medicine

## 2013-11-10 ENCOUNTER — Ambulatory Visit (AMBULATORY_SURGERY_CENTER): Payer: Medicare Other | Admitting: Internal Medicine

## 2013-11-10 VITALS — BP 109/64 | HR 62 | Temp 96.5°F | Resp 15 | Ht 64.0 in | Wt 145.0 lb

## 2013-11-10 DIAGNOSIS — F411 Generalized anxiety disorder: Secondary | ICD-10-CM | POA: Diagnosis not present

## 2013-11-10 DIAGNOSIS — K219 Gastro-esophageal reflux disease without esophagitis: Secondary | ICD-10-CM

## 2013-11-10 DIAGNOSIS — K209 Esophagitis, unspecified without bleeding: Secondary | ICD-10-CM

## 2013-11-10 DIAGNOSIS — J449 Chronic obstructive pulmonary disease, unspecified: Secondary | ICD-10-CM | POA: Diagnosis not present

## 2013-11-10 DIAGNOSIS — R05 Cough: Secondary | ICD-10-CM | POA: Diagnosis not present

## 2013-11-10 MED ORDER — SODIUM CHLORIDE 0.9 % IV SOLN: 500.0000 mL | INTRAVENOUS | Status: DC

## 2013-11-10 NOTE — Patient Instructions (Signed)

## 2013-11-10 NOTE — Progress Notes (Signed)
Called to room to assist during endoscopic procedure.  Patient ID and intended procedure confirmed with present staff. Received instructions for my participation in the procedure from the performing physician.  

## 2013-11-10 NOTE — Progress Notes (Signed)
Lidocaine-40mg IV prior to Propofol InductionPropofol given over incremental dosages 

## 2013-11-10 NOTE — Progress Notes (Addendum)
Patient did not experience any of the following events: a burn prior to discharge; a fall within the facility; wrong site/side/patient/procedure/implant event; or a hospital transfer or hospital admission upon discharge from the facility. ([id number])Patient did not have preoperative order for IV antibiotic SSI prophylaxis. [phone]

## 2013-11-10 NOTE — Op Note (Addendum)
[facility] [address] [place], [address]   ENDOSCOPY PROCEDURE REPORT  PATIENT: Mary, Gordon  MR#: [id number] BIRTHDATE: [birth year] , 67  yrs. old GENDER: Female ENDOSCOPIST: Hart Carwin, MD REFERRED BY:  Judy Pimple, M.D. PROCEDURE DATE:  11/10/2013 PROCEDURE:  EGD w/ biopsy ASA CLASS:     Class II INDICATIONS:  Heartburn.   Chest pain.   story of gastroesophageal reflux.  Sr.  with Barrett's esophagus. MEDICATIONS: MAC sedation, administered by CRNA and propofol (Diprivan) 100mg  IV TOPICAL ANESTHETIC: Cetacaine Spray  DESCRIPTION OF PROCEDURE: After the risks benefits and alternatives of the procedure were thoroughly explained, informed consent was obtained.  The LB [id number] endoscope was introduced through the mouth and advanced to the second portion of the duodenum. Without limitations.  The instrument was slowly withdrawn as the mucosa was fully examined.      [Esophagus: proximal and mid-esophageal mucosa appeared normal. The Z line was irregular. There was a tongue  of gastric mucosa extending into the esophagus about 1 cm. biopsies were taken to rule out dysplasia ,there was no hiatal hernia or stricture stomach: Rugal folds were unremarkable. The gastric antrum and pyloric outlet was normal. Retroflexion of the endoscope revealed normal fundus and cardia Duodenum: Duodenal bulb and descending duodenum appeared normal The scope was then withdrawn from the patient and the procedure completed.  COMPLICATIONS: There were no complications. ENDOSCOPIC IMPRESSION: irregular Z line. Status post biopsies to rule out Barrett's esophagus RECOMMENDATIONS: 1.  Await biopsy results 2.  Anti-reflux regimen to be follow 3.  Continue PPI  REPEAT EXAM: for EGD pending biopsy results.  eSigned:  Hart Carwin, MD 11/10/2013 9:03 AM Revised: 11/10/2013 9:03 AM  CC:

## 2013-11-10 NOTE — Progress Notes (Signed)
No allergy to eggs or soy. ewm No issues with past sedation. ewm

## 2013-11-11 ENCOUNTER — Telehealth: Payer: Self-pay | Admitting: *Deleted

## 2013-11-11 NOTE — Telephone Encounter (Signed)
  Follow up Call-  Call back number 11/10/2013 10/16/2011  Post procedure Call Back phone  # [phone] may leave message  Permission to leave phone message Yes -     Patient questions:  Do you have a fever, pain , or abdominal swelling? no Pain Score  0 *  Have you tolerated food without any problems? yes  Have you been able to return to your normal activities? yes  Do you have any questions about your discharge instructions: Diet   no Medications  no Follow up visit  no  Do you have questions or concerns about your Care? no  Actions: * If pain score is 4 or above: No action needed, pain <4.

## 2013-11-13 ENCOUNTER — Other Ambulatory Visit: Payer: Self-pay | Admitting: Family Medicine

## 2013-11-14 NOTE — Telephone Encounter (Signed)
Please have her schedule a medicare physical this spring or summer and refill for 6 months  thanks

## 2013-11-15 ENCOUNTER — Encounter: Payer: Self-pay | Admitting: Internal Medicine

## 2013-11-17 NOTE — Telephone Encounter (Signed)
Pt scheduled for Medicare CPE 03/24/14 and labs prior

## 2013-12-13 ENCOUNTER — Other Ambulatory Visit: Payer: Self-pay

## 2013-12-13 MED ORDER — SERTRALINE HCL 50 MG PO TABS: ORAL_TABLET | ORAL | Status: DC

## 2013-12-13 NOTE — Telephone Encounter (Signed)
Last filled 09/14/13 #90--please advise

## 2013-12-13 NOTE — Telephone Encounter (Signed)
Please refill for 6 months 

## 2014-01-16 DIAGNOSIS — M76899 Other specified enthesopathies of unspecified lower limb, excluding foot: Secondary | ICD-10-CM | POA: Diagnosis not present

## 2014-01-16 DIAGNOSIS — M25569 Pain in unspecified knee: Secondary | ICD-10-CM | POA: Diagnosis not present

## 2014-02-13 ENCOUNTER — Other Ambulatory Visit: Payer: Self-pay | Admitting: Internal Medicine

## 2014-02-13 DIAGNOSIS — M25569 Pain in unspecified knee: Secondary | ICD-10-CM | POA: Diagnosis not present

## 2014-02-13 DIAGNOSIS — M76899 Other specified enthesopathies of unspecified lower limb, excluding foot: Secondary | ICD-10-CM | POA: Diagnosis not present

## 2014-03-18 ENCOUNTER — Telehealth: Payer: Self-pay | Admitting: Family Medicine

## 2014-03-18 DIAGNOSIS — Z0000 Encounter for general adult medical examination without abnormal findings: Secondary | ICD-10-CM

## 2014-03-18 DIAGNOSIS — M949 Disorder of cartilage, unspecified: Principal | ICD-10-CM

## 2014-03-18 DIAGNOSIS — M899 Disorder of bone, unspecified: Secondary | ICD-10-CM

## 2014-03-18 DIAGNOSIS — E7800 Pure hypercholesterolemia, unspecified: Secondary | ICD-10-CM

## 2014-03-18 DIAGNOSIS — E559 Vitamin D deficiency, unspecified: Secondary | ICD-10-CM

## 2014-03-18 NOTE — Telephone Encounter (Signed)
Message copied by Abner Greenspan on Sat Mar 18, 2014  9:18 AM ------      Message from: Ellamae Sia      Created: Thu Mar 16, 2014  9:44 AM      Regarding: Lab orders for Monday,4.13.15       Patient is scheduled for CPX labs, please order future labs, Thanks , Terri       ------

## 2014-03-20 ENCOUNTER — Other Ambulatory Visit (INDEPENDENT_AMBULATORY_CARE_PROVIDER_SITE_OTHER): Payer: Medicare Other

## 2014-03-20 DIAGNOSIS — M949 Disorder of cartilage, unspecified: Secondary | ICD-10-CM

## 2014-03-20 DIAGNOSIS — R5383 Other fatigue: Secondary | ICD-10-CM

## 2014-03-20 DIAGNOSIS — M899 Disorder of bone, unspecified: Secondary | ICD-10-CM

## 2014-03-20 DIAGNOSIS — R5381 Other malaise: Secondary | ICD-10-CM

## 2014-03-20 DIAGNOSIS — E7800 Pure hypercholesterolemia, unspecified: Secondary | ICD-10-CM | POA: Diagnosis not present

## 2014-03-20 DIAGNOSIS — Z0000 Encounter for general adult medical examination without abnormal findings: Secondary | ICD-10-CM | POA: Diagnosis not present

## 2014-03-20 DIAGNOSIS — E559 Vitamin D deficiency, unspecified: Secondary | ICD-10-CM | POA: Diagnosis not present

## 2014-03-20 LAB — COMPREHENSIVE METABOLIC PANEL
ALT: 14 U/L (ref 0–35)
AST: 16 U/L (ref 0–37)
Albumin: 3.7 g/dL (ref 3.5–5.2)
Alkaline Phosphatase: 76 U/L (ref 39–117)
BILIRUBIN TOTAL: 0.4 mg/dL (ref 0.3–1.2)
BUN: 7 mg/dL (ref 6–23)
CHLORIDE: 102 meq/L (ref 96–112)
CO2: 30 meq/L (ref 19–32)
CREATININE: 0.8 mg/dL (ref 0.4–1.2)
Calcium: 8.9 mg/dL (ref 8.4–10.5)
GFR: 73.8 mL/min (ref >60.00)
Glucose, Bld: 97 mg/dL (ref 70–99)
Potassium: 4.6 mEq/L (ref 3.5–5.1)
Sodium: 138 mEq/L (ref 135–145)
Total Protein: 6.9 g/dL (ref 6.0–8.3)

## 2014-03-20 LAB — LIPID PANEL
Cholesterol: 226 mg/dL — ABNORMAL HIGH (ref 0–200)
HDL: 60.5 mg/dL (ref >39.00)
LDL Cholesterol: 145 mg/dL — ABNORMAL HIGH (ref 0–99)
Total CHOL/HDL Ratio: 4
Triglycerides: 101 mg/dL (ref 0.0–149.0)
VLDL: 20.2 mg/dL (ref 0.0–40.0)

## 2014-03-20 LAB — CBC WITH DIFFERENTIAL/PLATELET
BASOS ABS: 0 10*3/uL (ref 0.0–0.1)
Basophils Relative: 0.4 % (ref 0.0–3.0)
Eosinophils Absolute: 0.2 10*3/uL (ref 0.0–0.7)
Eosinophils Relative: 2.9 % (ref 0.0–5.0)
HCT: 38.7 % (ref 36.0–46.0)
Hemoglobin: 12.6 g/dL (ref 12.0–15.0)
LYMPHS ABS: 1.6 10*3/uL (ref 0.7–4.0)
Lymphocytes Relative: 30.7 % (ref 12.0–46.0)
MCHC: 32.4 g/dL (ref 30.0–36.0)
MCV: 88.3 fl (ref 78.0–100.0)
MONOS PCT: 6 % (ref 3.0–12.0)
Monocytes Absolute: 0.3 10*3/uL (ref 0.1–1.0)
NEUTROS ABS: 3.1 10*3/uL (ref 1.4–7.7)
NEUTROS PCT: 60 % (ref 43.0–77.0)
PLATELETS: 264 10*3/uL (ref 150.0–400.0)
RBC: 4.39 Mil/uL (ref 3.87–5.11)
RDW: 12.8 % (ref 11.5–14.6)
WBC: 5.2 10*3/uL (ref 4.5–10.5)

## 2014-03-20 LAB — TSH: TSH: 2.05 u[IU]/mL (ref 0.35–5.50)

## 2014-03-21 LAB — VITAMIN D 25 HYDROXY (VIT D DEFICIENCY, FRACTURES): Vit D, 25-Hydroxy: 33 ng/mL (ref 30–89)

## 2014-03-22 ENCOUNTER — Telehealth: Payer: Self-pay | Admitting: Family Medicine

## 2014-03-22 DIAGNOSIS — E7800 Pure hypercholesterolemia, unspecified: Secondary | ICD-10-CM

## 2014-03-22 DIAGNOSIS — M899 Disorder of bone, unspecified: Secondary | ICD-10-CM

## 2014-03-22 DIAGNOSIS — E559 Vitamin D deficiency, unspecified: Secondary | ICD-10-CM

## 2014-03-22 DIAGNOSIS — M949 Disorder of cartilage, unspecified: Principal | ICD-10-CM

## 2014-03-22 DIAGNOSIS — Z0000 Encounter for general adult medical examination without abnormal findings: Secondary | ICD-10-CM

## 2014-03-22 NOTE — Telephone Encounter (Signed)
Message copied by Abner Greenspan on Wed Mar 22, 2014  7:27 PM ------      Message from: Ellamae Sia      Created: Thu Mar 09, 2014 12:23 PM      Regarding: Lab orders for Thursday, 4.16.15       Patient is scheduled for CPX labs, please order future labs, Thanks , Terri       ------

## 2014-03-23 ENCOUNTER — Other Ambulatory Visit: Payer: Medicare Other

## 2014-03-24 ENCOUNTER — Encounter: Payer: Self-pay | Admitting: Family Medicine

## 2014-03-24 ENCOUNTER — Ambulatory Visit (INDEPENDENT_AMBULATORY_CARE_PROVIDER_SITE_OTHER): Payer: Medicare Other | Admitting: Family Medicine

## 2014-03-24 VITALS — BP 124/82 | HR 65 | Temp 98.3°F | Ht 63.75 in | Wt 151.8 lb

## 2014-03-24 DIAGNOSIS — E7800 Pure hypercholesterolemia, unspecified: Secondary | ICD-10-CM | POA: Diagnosis not present

## 2014-03-24 DIAGNOSIS — E559 Vitamin D deficiency, unspecified: Secondary | ICD-10-CM

## 2014-03-24 DIAGNOSIS — IMO0001 Reserved for inherently not codable concepts without codable children: Secondary | ICD-10-CM

## 2014-03-24 DIAGNOSIS — M949 Disorder of cartilage, unspecified: Secondary | ICD-10-CM

## 2014-03-24 DIAGNOSIS — M797 Fibromyalgia: Secondary | ICD-10-CM

## 2014-03-24 DIAGNOSIS — M899 Disorder of bone, unspecified: Secondary | ICD-10-CM

## 2014-03-24 DIAGNOSIS — Z0000 Encounter for general adult medical examination without abnormal findings: Secondary | ICD-10-CM

## 2014-03-24 NOTE — Patient Instructions (Signed)
Watch cholesterol in your diet  If you want to try red yeast rice - use it with caution  Avoid red meat/ fried foods/ egg yolks/ fatty breakfast meats/ butter, cheese and high fat dairy/ and shellfish   Take care of yourself  I will review the article your article/ find some more info and get back to you    Fat and Cholesterol Control Diet Fat and cholesterol levels in your blood and organs are influenced by your diet. High levels of fat and cholesterol may lead to diseases of the heart, small and large blood vessels, gallbladder, liver, and pancreas. CONTROLLING FAT AND CHOLESTEROL WITH DIET Although exercise and lifestyle factors are important, your diet is key. That is because certain foods are known to raise cholesterol and others to lower it. The goal is to balance foods for their effect on cholesterol and more importantly, to replace saturated and trans fat with other types of fat, such as monounsaturated fat, polyunsaturated fat, and omega-3 fatty acids. On average, a person should consume no more than 15 to 17 g of saturated fat daily. Saturated and trans fats are considered "bad" fats, and they will raise LDL cholesterol. Saturated fats are primarily found in animal products such as meats, butter, and cream. However, that does not mean you need to give up all your favorite foods. Today, there are good tasting, low-fat, low-cholesterol substitutes for most of the things you like to eat. Choose low-fat or nonfat alternatives. Choose round or loin cuts of red meat. These types of cuts are lowest in fat and cholesterol. Chicken (without the skin), fish, veal, and ground [place] breast are great choices. Eliminate fatty meats, such as hot dogs and salami. Even shellfish have little or no saturated fat. Have a 3 oz (85 g) portion when you eat lean meat, poultry, or fish. Trans fats are also called "partially hydrogenated oils." They are oils that have been scientifically manipulated so that they are  solid at room temperature resulting in a longer shelf life and improved taste and texture of foods in which they are added. Trans fats are found in stick margarine, some tub margarines, cookies, crackers, and baked goods.  When baking and cooking, oils are a great substitute for butter. The monounsaturated oils are especially beneficial since it is believed they lower LDL and raise HDL. The oils you should avoid entirely are saturated tropical oils, such as coconut and palm.  Remember to eat a lot from food groups that are naturally free of saturated and trans fat, including fish, fruit, vegetables, beans, grains (barley, rice, couscous, bulgur wheat), and pasta (without cream sauces).  IDENTIFYING FOODS THAT LOWER FAT AND CHOLESTEROL  Soluble fiber may lower your cholesterol. This type of fiber is found in fruits such as apples, vegetables such as broccoli, potatoes, and carrots, legumes such as beans, peas, and lentils, and grains such as barley. Foods fortified with plant sterols (phytosterol) may also lower cholesterol. You should eat at least 2 g per day of these foods for a cholesterol lowering effect.  Read package labels to identify low-saturated fats, trans fat free, and low-fat foods at the supermarket. Select cheeses that have only 2 to 3 g saturated fat per ounce. Use a heart-healthy tub margarine that is free of trans fats or partially hydrogenated oil. When buying baked goods (cookies, crackers), avoid partially hydrogenated oils. Breads and muffins should be made from whole grains (whole-wheat or whole oat flour, instead of "flour" or "enriched flour"). Buy non-creamy  canned soups with reduced salt and no added fats.  FOOD PREPARATION TECHNIQUES  Never deep-fry. If you must fry, either stir-fry, which uses very little fat, or use non-stick cooking sprays. When possible, broil, bake, or roast meats, and steam vegetables. Instead of putting butter or margarine on vegetables, use lemon and herbs,  applesauce, and cinnamon (for squash and sweet potatoes). Use nonfat yogurt, salsa, and low-fat dressings for salads.  LOW-SATURATED FAT / LOW-FAT FOOD SUBSTITUTES Meats / Saturated Fat (g)  Avoid: Steak, marbled (3 oz/85 g) / 11 g  Choose: Steak, lean (3 oz/85 g) / 4 g  Avoid: Hamburger (3 oz/85 g) / 7 g  Choose: Hamburger, lean (3 oz/85 g) / 5 g  Avoid: Ham (3 oz/85 g) / 6 g  Choose: Ham, lean cut (3 oz/85 g) / 2.4 g  Avoid: Chicken, with skin, dark meat (3 oz/85 g) / 4 g  Choose: Chicken, skin removed, dark meat (3 oz/85 g) / 2 g  Avoid: Chicken, with skin, light meat (3 oz/85 g) / 2.5 g  Choose: Chicken, skin removed, light meat (3 oz/85 g) / 1 g Dairy / Saturated Fat (g)  Avoid: Whole milk (1 cup) / 5 g  Choose: Low-fat milk, 2% (1 cup) / 3 g  Choose: Low-fat milk, 1% (1 cup) / 1.5 g  Choose: Skim milk (1 cup) / 0.3 g  Avoid: Hard cheese (1 oz/28 g) / 6 g  Choose: Skim milk cheese (1 oz/28 g) / 2 to 3 g  Avoid: Cottage cheese, 4% fat (1 cup) / 6.5 g  Choose: Low-fat cottage cheese, 1% fat (1 cup) / 1.5 g  Avoid: Ice cream (1 cup) / 9 g  Choose: Sherbet (1 cup) / 2.5 g  Choose: Nonfat frozen yogurt (1 cup) / 0.3 g  Choose: Frozen fruit bar / trace  Avoid: Whipped cream (1 tbs) / 3.5 g  Choose: Nondairy whipped topping (1 tbs) / 1 g Condiments / Saturated Fat (g)  Avoid: Mayonnaise (1 tbs) / 2 g  Choose: Low-fat mayonnaise (1 tbs) / 1 g  Avoid: Butter (1 tbs) / 7 g  Choose: Extra light margarine (1 tbs) / 1 g  Avoid: Coconut oil (1 tbs) / 11.8 g  Choose: Olive oil (1 tbs) / 1.8 g  Choose: Corn oil (1 tbs) / 1.7 g  Choose: Safflower oil (1 tbs) / 1.2 g  Choose: Sunflower oil (1 tbs) / 1.4 g  Choose: Soybean oil (1 tbs) / 2.4 g  Choose: Canola oil (1 tbs) / 1 g Document Released: 11/24/2005 Document Revised: 03/21/2013 Document Reviewed: 05/15/2011 Exit[employer] Patient Information 2014 [place], [place]. Fat and Cholesterol Control Diet Fat and  cholesterol levels in your blood and organs are influenced by your diet. High levels of fat and cholesterol may lead to diseases of the heart, small and large blood vessels, gallbladder, liver, and pancreas. CONTROLLING FAT AND CHOLESTEROL WITH DIET Although exercise and lifestyle factors are important, your diet is key. That is because certain foods are known to raise cholesterol and others to lower it. The goal is to balance foods for their effect on cholesterol and more importantly, to replace saturated and trans fat with other types of fat, such as monounsaturated fat, polyunsaturated fat, and omega-3 fatty acids. On average, a person should consume no more than 15 to 17 g of saturated fat daily. Saturated and trans fats are considered "bad" fats, and they will raise LDL cholesterol. Saturated fats are primarily found  in animal products such as meats, butter, and cream. However, that does not mean you need to give up all your favorite foods. Today, there are good tasting, low-fat, low-cholesterol substitutes for most of the things you like to eat. Choose low-fat or nonfat alternatives. Choose round or loin cuts of red meat. These types of cuts are lowest in fat and cholesterol. Chicken (without the skin), fish, veal, and ground [place] breast are great choices. Eliminate fatty meats, such as hot dogs and salami. Even shellfish have little or no saturated fat. Have a 3 oz (85 g) portion when you eat lean meat, poultry, or fish. Trans fats are also called "partially hydrogenated oils." They are oils that have been scientifically manipulated so that they are solid at room temperature resulting in a longer shelf life and improved taste and texture of foods in which they are added. Trans fats are found in stick margarine, some tub margarines, cookies, crackers, and baked goods.  When baking and cooking, oils are a great substitute for butter. The monounsaturated oils are especially beneficial since it is believed  they lower LDL and raise HDL. The oils you should avoid entirely are saturated tropical oils, such as coconut and palm.  Remember to eat a lot from food groups that are naturally free of saturated and trans fat, including fish, fruit, vegetables, beans, grains (barley, rice, couscous, bulgur wheat), and pasta (without cream sauces).  IDENTIFYING FOODS THAT LOWER FAT AND CHOLESTEROL  Soluble fiber may lower your cholesterol. This type of fiber is found in fruits such as apples, vegetables such as broccoli, potatoes, and carrots, legumes such as beans, peas, and lentils, and grains such as barley. Foods fortified with plant sterols (phytosterol) may also lower cholesterol. You should eat at least 2 g per day of these foods for a cholesterol lowering effect.  Read package labels to identify low-saturated fats, trans fat free, and low-fat foods at the supermarket. Select cheeses that have only 2 to 3 g saturated fat per ounce. Use a heart-healthy tub margarine that is free of trans fats or partially hydrogenated oil. When buying baked goods (cookies, crackers), avoid partially hydrogenated oils. Breads and muffins should be made from whole grains (whole-wheat or whole oat flour, instead of "flour" or "enriched flour"). Buy non-creamy canned soups with reduced salt and no added fats.  FOOD PREPARATION TECHNIQUES  Never deep-fry. If you must fry, either stir-fry, which uses very little fat, or use non-stick cooking sprays. When possible, broil, bake, or roast meats, and steam vegetables. Instead of putting butter or margarine on vegetables, use lemon and herbs, applesauce, and cinnamon (for squash and sweet potatoes). Use nonfat yogurt, salsa, and low-fat dressings for salads.  LOW-SATURATED FAT / LOW-FAT FOOD SUBSTITUTES Meats / Saturated Fat (g)  Avoid: Steak, marbled (3 oz/85 g) / 11 g  Choose: Steak, lean (3 oz/85 g) / 4 g  Avoid: Hamburger (3 oz/85 g) / 7 g  Choose: Hamburger, lean (3 oz/85 g) / 5  g  Avoid: Ham (3 oz/85 g) / 6 g  Choose: Ham, lean cut (3 oz/85 g) / 2.4 g  Avoid: Chicken, with skin, dark meat (3 oz/85 g) / 4 g  Choose: Chicken, skin removed, dark meat (3 oz/85 g) / 2 g  Avoid: Chicken, with skin, light meat (3 oz/85 g) / 2.5 g  Choose: Chicken, skin removed, light meat (3 oz/85 g) / 1 g Dairy / Saturated Fat (g)  Avoid: Whole milk (1 cup) / 5  g  Choose: Low-fat milk, 2% (1 cup) / 3 g  Choose: Low-fat milk, 1% (1 cup) / 1.5 g  Choose: Skim milk (1 cup) / 0.3 g  Avoid: Hard cheese (1 oz/28 g) / 6 g  Choose: Skim milk cheese (1 oz/28 g) / 2 to 3 g  Avoid: Cottage cheese, 4% fat (1 cup) / 6.5 g  Choose: Low-fat cottage cheese, 1% fat (1 cup) / 1.5 g  Avoid: Ice cream (1 cup) / 9 g  Choose: Sherbet (1 cup) / 2.5 g  Choose: Nonfat frozen yogurt (1 cup) / 0.3 g  Choose: Frozen fruit bar / trace  Avoid: Whipped cream (1 tbs) / 3.5 g  Choose: Nondairy whipped topping (1 tbs) / 1 g Condiments / Saturated Fat (g)  Avoid: Mayonnaise (1 tbs) / 2 g  Choose: Low-fat mayonnaise (1 tbs) / 1 g  Avoid: Butter (1 tbs) / 7 g  Choose: Extra light margarine (1 tbs) / 1 g  Avoid: Coconut oil (1 tbs) / 11.8 g  Choose: Olive oil (1 tbs) / 1.8 g  Choose: Corn oil (1 tbs) / 1.7 g  Choose: Safflower oil (1 tbs) / 1.2 g  Choose: Sunflower oil (1 tbs) / 1.4 g  Choose: Soybean oil (1 tbs) / 2.4 g  Choose: Canola oil (1 tbs) / 1 g Document Released: 11/24/2005 Document Revised: 03/21/2013 Document Reviewed: 05/15/2011 Exit[employer] Patient Information 2014 [place], [place].

## 2014-03-24 NOTE — Progress Notes (Signed)
Pre visit review using our clinic review tool, if applicable. No additional management support is needed unless otherwise documented below in the visit note. 

## 2014-03-24 NOTE — Progress Notes (Signed)
Subjective:    Patient ID: Mary Gordon, female    DOB: 12-27-1945, 68 y.o.   MRN: [id number]  HPI I have personally reviewed the Medicare Annual Wellness questionnaire and have noted 1. The patient's medical and social history 2. Their use of alcohol, tobacco or illicit drugs 3. Their current medications and supplements 4. The patient's functional ability including ADL's, fall risks, home safety risks and hearing or visual             impairment. 5. Diet and physical activities 6. Evidence for depression or mood disorders  The patients weight, height, BMI have been recorded in the chart and visual acuity is per eye clinic.  I have made referrals, counseling and provided education to the patient based review of the above and I have provided the pt with a written personalized care plan for preventive services.  Is doing pretty well overall  Has had some f/u with Dr Charlestine Night for bursitis and also fibromyalgia  She read something about naltrexone for fibromyalgia and is interested to try it - brought an article about it to me   See scanned forms.  Routine anticipatory guidance given to patient.  See health maintenance. Colon cancer screening 11/12 -- 10 year recall  Breast cancer screening 8/14 was ok - she will make her own appt  Self breast exam- no lumps or changes  Flu vaccine 9/14 Tetanus vaccine 9/10  Pneumovax 10/09 Zoster vaccine 1/10   Advance directive- she has a living will  Cognitive function addressed- see scanned forms- and if abnormal then additional documentation follows. - memory is pretty good overall  She reads and does word puzzles     PMH and SH reviewed  Meds, vitals, and allergies reviewed.   ROS: See HPI.  Otherwise negative.     Osteopenia dexa  2013 - with Dr Hulan Fray - will be due for a 2 year f/u  D level is 51- she has 1000 mg D   Pap 7/13 with Dr Hulan Fray  Does not do one every year   Hyperlipidemia Lab Results  Component Value Date   CHOL  226* 03/20/2014   CHOL 238* 10/24/2013   CHOL 226* 10/15/2012   Lab Results  Component Value Date   HDL 60.50 03/20/2014   HDL 67.20 10/24/2013   HDL 54.10 10/15/2012   Lab Results  Component Value Date   LDLCALC 145* 03/20/2014   LDLCALC 90 09/19/2011   LDLCALC 119* 06/19/2011   Lab Results  Component Value Date   TRIG 101.0 03/20/2014   TRIG 88.0 10/24/2013   TRIG 128.0 10/15/2012   Lab Results  Component Value Date   CHOLHDL 4 03/20/2014   CHOLHDL 4 10/24/2013   CHOLHDL 4 10/15/2012   Lab Results  Component Value Date   LDLDIRECT 161.5 10/24/2013   LDLDIRECT 153.0 10/15/2012   this come down a little -but not at goal  Diet is not the best - she is too tired to prepare food  Not cooking as well as she was    Patient Active Problem List   Diagnosis Date Noted  . Encounter for Medicare annual wellness exam 03/18/2014  . Skin tear of right lower leg without complication 78/29/5621  . Muscle pain 10/15/2012  . Fatigue 05/31/2012  . Myalgia 05/31/2012  . Neck mass 05/31/2012  . Right upper quadrant abdominal pain 09/26/2011  . Special screening for malignant neoplasms, colon 09/26/2011  . Hoarseness 07/25/2011  . GERD (gastroesophageal reflux disease) 07/08/2011  .  Chest pain 06/18/2011  . CERVICAL RADICULOPATHY, RIGHT 09/04/2010  . FATIGUE 04/22/2010  . SKIN RASH 04/22/2010  . PULMONARY NODULE 09/14/2009  . PALPITATIONS 08/17/2009  . UNSPECIFIED VITAMIN D DEFICIENCY 02/22/2009  . HYPERCHOLESTEROLEMIA 02/22/2009  . DEPRESSION 02/22/2009  . ALLERGIC RHINITIS 02/22/2009  . MENOPAUSAL SYNDROME 02/22/2009  . OSTEOPENIA 02/22/2009   Past Medical History  Diagnosis Date  . Palpitations   . High cholesterol   . Panic disorder   . Depression   . Osteopenia   . Vitamin D deficiency   . History of Clostridium difficile infection     In past from augmentin  . COPD (chronic obstructive pulmonary disease)     ? of on cxr in 9/09, then nl pfts with no obstr or restrictive  dz noted  . Pulmonary nodule     Being observed  . Allergy     all year  . Cervical cancer 1978  . Cataract   . Anxiety   . GERD (gastroesophageal reflux disease)   . Fibromyalgia   . Bursitis of hip    Past Surgical History  Procedure Laterality Date  . Abdominal hysterectomy  1978  . Shoulder surgery  2008    Right shoulder bone spur  . Nasal hemorrhage control  2007  . 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 Colonoscopy  2002    Normal   History  Substance Use Topics  . Smoking status: Former Smoker -- 0.80 packs/day for 16 years    Types: Cigarettes    Quit date: 12/08/1978  . Smokeless tobacco: Never Used     Comment: Started at age 19  . Alcohol Use: No   Family History  Problem Relation Age of Onset  . Heart attack Mother 52  . Emphysema Mother   . Allergies Mother   . Asthma Mother   . Allergies Father   . Arrhythmia Sister     Atrial fibrillation  . Cancer Sister     breast  . Diabetes Other     Parent  . Hyperlipidemia Other     Other relative  . Hypertension Other     Parent, other relative  . Glaucoma Other   . Breast cancer Other     Other relative, sister  . Allergies Other     Siblings  . Asthma Other     Sisters  . Emphysema Sister   . Heart disease Sister   . Rheumatologic disease Sister   . Lung cancer Sister   . Colon cancer Neg Hx   . Esophageal cancer Neg Hx   . Rectal cancer Neg Hx   . Stomach cancer Neg Hx    Allergies  Allergen Reactions  . Amoxicillin-Pot Clavulanate Other (See Comments)    Cdiff  . Simvastatin     myalgias   Current Outpatient Prescriptions on File Prior to Visit  Medication Sig Dispense Refill  . diphenhydrAMINE (BENADRYL) 25 MG tablet Take 25 mg by mouth every 6 (six) hours as needed.      Marland Kitchen estradiol (ESTRACE) 1 MG tablet Take 1 tablet (1 mg total) by mouth daily.  100 tablet  5  . mometasone (NASONEX) 50 MCG/ACT nasal spray 2 sprays in each nostril once daily as needed.  17 g  11  .  omeprazole (PRILOSEC) 40 MG capsule TAKE 1 CAPSULE (40 MG TOTAL) BY MOUTH 2 (TWO) TIMES DAILY.  60 capsule  3  . sertraline (ZOLOFT) 50 MG tablet TAKE 1  TABLET EVERY DAY  90 tablet  1  . traZODone (DESYREL) 100 MG tablet TAKE 1 TABLET BY MOUTH AT BEDTIME.  90 tablet  1   No current facility-administered medications on file prior to visit.     Review of Systems Review of Systems  Constitutional: Negative for fever, appetite change, fatigue and unexpected weight change.  Eyes: Negative for pain and visual disturbance.  Respiratory: Negative for cough and shortness of breath.   Cardiovascular: Negative for cp or palpitations    Gastrointestinal: Negative for nausea, diarrhea and constipation.  Genitourinary: Negative for urgency and frequency.  Skin: Negative for pallor or rash   Neurological: Negative for weakness, light-headedness, numbness and headaches.  Hematological: Negative for adenopathy. Does not bruise/bleed easily.  Psychiatric/Behavioral: Negative for dysphoric mood. The patient is not nervous/anxious.         Objective:   Physical Exam  Constitutional: She appears well-developed and well-nourished. No distress.  HENT:  Head: Normocephalic and atraumatic.  Right Ear: External ear normal.  Left Ear: External ear normal.  Nose: Nose normal.  Mouth/Throat: Oropharynx is clear and moist.  Eyes: Conjunctivae and EOM are normal. Pupils are equal, round, and reactive to light. Right eye exhibits no discharge. Left eye exhibits no discharge. No scleral icterus.  Neck: Normal range of motion. Neck supple. No JVD present. No thyromegaly present.  Cardiovascular: Normal rate, regular rhythm, normal heart sounds and intact distal pulses.  Exam reveals no gallop.   Pulmonary/Chest: Effort normal and breath sounds normal. No respiratory distress. She has no wheezes. She has no rales.  Abdominal: Soft. Bowel sounds are normal. She exhibits no distension and no mass. There is no  tenderness.  Musculoskeletal: She exhibits tenderness. She exhibits no edema.  Pos myofasical trigger points  Pt moves slowly  No kyphosis   Lymphadenopathy:    She has no cervical adenopathy.  Neurological: She is alert. She has normal reflexes. No cranial nerve deficit. She exhibits normal muscle tone. Coordination normal.  Skin: Skin is warm and dry. No rash noted. No erythema. No pallor.  Psychiatric: She has a normal mood and affect.          Assessment & Plan:

## 2014-03-26 NOTE — Assessment & Plan Note (Signed)
Disc goals for lipids and reasons to control them Rev labs with pt Rev low sat fat diet in detail Given handout on diet - will continue to follow

## 2014-03-26 NOTE — Assessment & Plan Note (Signed)
Pt is followed by gyn for this - dexa every 2 years Disc vit D and ca intake Disc need for calcium/ vitamin D/ wt bearing exercise and bone density test every 2 y to monitor Disc safety/ fracture risk in detail

## 2014-03-26 NOTE — Assessment & Plan Note (Signed)
Reviewed health habits including diet and exercise and skin cancer prevention Reviewed appropriate screening tests for age  Also reviewed health mt list, fam hx and immunization status , as well as social and family history   See HPI Labs reviewed  

## 2014-03-26 NOTE — Assessment & Plan Note (Signed)
Pt has an article on naltrexone as tx - plan to do a literature search on this  She continues to be as active as possible / low impact and continue current meds

## 2014-03-26 NOTE — Assessment & Plan Note (Signed)
Disc optimal D level and intake  This may or may not help her pain also

## 2014-04-17 ENCOUNTER — Encounter: Payer: Self-pay | Admitting: Family Medicine

## 2014-05-13 ENCOUNTER — Other Ambulatory Visit: Payer: Self-pay | Admitting: Family Medicine

## 2014-05-15 NOTE — Telephone Encounter (Signed)
Please refill for a year  

## 2014-05-15 NOTE — Telephone Encounter (Signed)
Electronic refill request, pt had CPE on 03/24/14, please advise

## 2014-05-15 NOTE — Telephone Encounter (Signed)
done

## 2014-06-09 ENCOUNTER — Other Ambulatory Visit: Payer: Self-pay | Admitting: Family Medicine

## 2014-06-12 NOTE — Telephone Encounter (Signed)
Electronic refill request, please advise  

## 2014-06-12 NOTE — Telephone Encounter (Signed)
Please refill for a year  

## 2014-07-08 ENCOUNTER — Other Ambulatory Visit: Payer: Self-pay | Admitting: Internal Medicine

## 2014-08-17 ENCOUNTER — Other Ambulatory Visit: Payer: Self-pay

## 2014-08-17 DIAGNOSIS — Z1231 Encounter for screening mammogram for malignant neoplasm of breast: Secondary | ICD-10-CM

## 2014-08-18 ENCOUNTER — Ambulatory Visit (INDEPENDENT_AMBULATORY_CARE_PROVIDER_SITE_OTHER)
Admission: RE | Admit: 2014-08-18 | Discharge: 2014-08-18 | Disposition: A | Discharge status: Home / Self Care | Payer: Medicare Other | Source: Ambulatory Visit | Attending: Family Medicine | Admitting: Family Medicine

## 2014-08-18 ENCOUNTER — Encounter: Payer: Self-pay | Admitting: Family Medicine

## 2014-08-18 ENCOUNTER — Ambulatory Visit (INDEPENDENT_AMBULATORY_CARE_PROVIDER_SITE_OTHER): Payer: Medicare Other | Admitting: Family Medicine

## 2014-08-18 VITALS — BP 128/80 | HR 71 | Temp 98.0°F | Ht 63.75 in | Wt 157.8 lb

## 2014-08-18 DIAGNOSIS — M25569 Pain in unspecified knee: Secondary | ICD-10-CM | POA: Diagnosis not present

## 2014-08-18 DIAGNOSIS — R0989 Other specified symptoms and signs involving the circulatory and respiratory systems: Secondary | ICD-10-CM | POA: Insufficient documentation

## 2014-08-18 DIAGNOSIS — M25562 Pain in left knee: Secondary | ICD-10-CM | POA: Insufficient documentation

## 2014-08-18 DIAGNOSIS — F458 Other somatoform disorders: Secondary | ICD-10-CM | POA: Diagnosis not present

## 2014-08-18 DIAGNOSIS — R09A2 Foreign body sensation, throat: Secondary | ICD-10-CM | POA: Insufficient documentation

## 2014-08-18 DIAGNOSIS — R0789 Other chest pain: Secondary | ICD-10-CM | POA: Diagnosis not present

## 2014-08-18 NOTE — Patient Instructions (Signed)
Try zyrtec 10 mg over the counter one pill daily  You can still take benadryl if you need it  If throat symptoms are not better in 2 weeks please call us for referral to ENT Xray of knee today

## 2014-08-18 NOTE — Progress Notes (Signed)
Subjective:    Patient ID: Mary Gordon, female    DOB: [birth year], 68 y.o.   MRN: [id number]  HPI Here for a knee problem and some other issues   L knee- injured a year ago sitting cross legged  Talked to Dr Charlestine Night - he said we should eval  Not really swell- she had it puff once on the inside  Can walk  Twisting makes it worst - or side to side movement   She coughs chronically  Does not think it is due to allergies  Was told in the past - it is probaly acid reflux - but now on omeprazole 40  Constant throat clearing  She spits out mucous / feels globus sensation  In throat  Takes a benadryl at night  (took it in am during high pollen season)  She tried zyrtec - worked for a while and then stopped  She used to take [employer] D- same , and claritin- the same   Not having heartburn at all   In the last 2 months - some episodes of sob going up the stairs - perhaps getting light headed/ tight feeling in the chest or sweating  Lays down- and feels better  Known hx of pvc Not really having palpitations  No family hx of CAD Non smoker Lab Results  Component Value Date   CHOL 226* 03/20/2014   HDL 60.50 03/20/2014   LDLCALC 145* 03/20/2014   LDLDIRECT 161.5 10/24/2013   TRIG 101.0 03/20/2014   CHOLHDL 4 03/20/2014     Patient Active Problem List   Diagnosis Date Noted  . Chest discomfort 08/18/2014  . Globus sensation 08/18/2014  . Left knee pain 08/18/2014  . Encounter for Medicare annual wellness exam 03/18/2014  . Skin tear of right lower leg without complication 09/23/5101  . Muscle pain 10/15/2012  . Fatigue 05/31/2012  . Fibromyalgia 05/31/2012  . Neck mass 05/31/2012  . Special screening for malignant neoplasms, colon 09/26/2011  . Hoarseness 07/25/2011  . GERD (gastroesophageal reflux disease) 07/08/2011  . Chest pain 06/18/2011  . CERVICAL RADICULOPATHY, RIGHT 09/04/2010  . SKIN RASH 04/22/2010  . PULMONARY NODULE 09/14/2009  . PALPITATIONS 08/17/2009    . UNSPECIFIED VITAMIN D DEFICIENCY 02/22/2009  . HYPERCHOLESTEROLEMIA 02/22/2009  . DEPRESSION 02/22/2009  . ALLERGIC RHINITIS 02/22/2009  . MENOPAUSAL SYNDROME 02/22/2009  . OSTEOPENIA 02/22/2009   Past Medical History  Diagnosis Date  . Palpitations   . High cholesterol   . Panic disorder   . Depression   . Osteopenia   . Vitamin D deficiency   . History of Clostridium difficile infection     In past from augmentin  . COPD (chronic obstructive pulmonary disease)     ? of on cxr in 9/09, then nl pfts with no obstr or restrictive dz noted  . Pulmonary nodule     Being observed  . Allergy     all year  . Cervical cancer 1978  . Cataract   . Anxiety   . GERD (gastroesophageal reflux disease)   . Fibromyalgia   . Bursitis of hip    Past Surgical History  Procedure Laterality Date  . Abdominal hysterectomy  1978  . Shoulder surgery  2008    Right shoulder bone spur  . Nasal hemorrhage control  2007  . 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 Colonoscopy  2002    Normal   History  Substance Use Topics  . Smoking  status: Former Smoker -- 0.80 packs/day for 16 years    Types: Cigarettes    Quit date: 12/08/1978  . Smokeless tobacco: Never Used     Comment: Started at age 15  . Alcohol Use: No   Family History  Problem Relation Age of Onset  . Heart attack Mother 28  . Emphysema Mother   . Allergies Mother   . Asthma Mother   . Allergies Father   . Arrhythmia Sister     Atrial fibrillation  . Cancer Sister     breast  . Diabetes Other     Parent  . Hyperlipidemia Other     Other relative  . Hypertension Other     Parent, other relative  . Glaucoma Other   . Breast cancer Other     Other relative, sister  . Allergies Other     Siblings  . Asthma Other     Sisters  . Emphysema Sister   . Heart disease Sister   . Rheumatologic disease Sister   . Lung cancer Sister   . Colon cancer Neg Hx   . Esophageal cancer Neg Hx   . Rectal cancer Neg Hx    . Stomach cancer Neg Hx    Allergies  Allergen Reactions  . Amoxicillin-Pot Clavulanate Other (See Comments)    Cdiff  . Simvastatin     myalgias   Current Outpatient Prescriptions on File Prior to Visit  Medication Sig Dispense Refill  . diphenhydrAMINE (BENADRYL) 25 MG tablet Take 25 mg by mouth every 6 (six) hours as needed.      Marland Kitchen estradiol (ESTRACE) 1 MG tablet Take 1 tablet (1 mg total) by mouth daily.  100 tablet  5  . mometasone (NASONEX) 50 MCG/ACT nasal spray 2 sprays in each nostril once daily as needed.  17 g  11  . omeprazole (PRILOSEC) 40 MG capsule TAKE 1 CAPSULE (40 MG TOTAL) BY MOUTH 2 (TWO) TIMES DAILY.  60 capsule  3  . sertraline (ZOLOFT) 50 MG tablet TAKE 1 TABLET EVERY DAY  90 tablet  3  . traZODone (DESYREL) 100 MG tablet TAKE 1 TABLET BY MOUTH AT BEDTIME.  90 tablet  3   No current facility-administered medications on file prior to visit.    Review of Systems Review of Systems  Constitutional: Negative for fever, appetite change,  and unexpected weight change.  Eyes: Negative for pain and visual disturbance.  Respiratory: Negative for wheeze  and shortness of breath.   Cardiovascular: pos for exertional chest pressure with sob and occ palpitations, neg for pedal edema/PND and orthopnea     Gastrointestinal: Negative for nausea, diarrhea and constipation.  Genitourinary: Negative for urgency and frequency.  Skin: Negative for pallor or rash   MSK pos for knee pain  Neurological: Negative for weakness, light-headedness, numbness and headaches.  Hematological: Negative for adenopathy. Does not bruise/bleed easily.  Psychiatric/Behavioral: Negative for dysphoric mood. The patient is not nervous/anxious.         Objective:   Physical Exam  Constitutional: She appears well-developed and well-nourished. No distress.  overwt and well appearing   HENT:  Head: Normocephalic and atraumatic.  Right Ear: External ear normal.  Left Ear: External ear normal.   Nose: Nose normal.  Mouth/Throat: Oropharynx is clear and moist.  Nares are boggy   Eyes: Conjunctivae and EOM are normal. Pupils are equal, round, and reactive to light. No scleral icterus.  Neck: Normal range of motion. Neck supple. No JVD present.  No thyromegaly present.  Cardiovascular: Normal rate, regular rhythm, normal heart sounds and intact distal pulses.  Exam reveals no gallop.   No murmur heard. Pulmonary/Chest: Effort normal and breath sounds normal. No respiratory distress. She has no wheezes. She has no rales. She exhibits no tenderness.  Abdominal: Soft. Bowel sounds are normal. She exhibits no distension and no mass. There is no tenderness.  Musculoskeletal: She exhibits tenderness. She exhibits no edema.       Left knee: She exhibits decreased range of motion. She exhibits no swelling, no effusion, no ecchymosis, no deformity, no erythema, normal alignment, no LCL laxity, normal patellar mobility, no bony tenderness, normal meniscus and no MCL laxity. Tenderness found. Medial joint line tenderness noted.  Lymphadenopathy:    She has no cervical adenopathy.  Neurological: She is alert. She has normal reflexes. No cranial nerve deficit. She exhibits normal muscle tone. Coordination normal.  Skin: Skin is warm and dry. No rash noted. No erythema. No pallor.  Psychiatric: She has a normal mood and affect.          Assessment & Plan:   Problem List Items Addressed This Visit     Other   Chest discomfort - Primary     Exertional -several episodes with sob and clamminess  EKG NSR with rate of 82 and 2 PVC Will refer to cardiology for eval inst to go to ER if symptoms return and do not quickly resolve Story is worrisome for angina     Relevant Orders      EKG 12-Lead (Completed)      Ambulatory referral to Cardiology   Globus sensation     Ongoing On GERD med w/o gi symptoms  Will try zyrtec for post nasal drip  Update - if no imp will need ENT referral      Left knee pain     Medial pain and tenderness Disc poss of OA or meniscal problem Xray today     Relevant Orders      DG Knee Complete 4 Views Left (Completed)

## 2014-08-18 NOTE — Assessment & Plan Note (Addendum)
Exertional -several episodes with sob and clamminess  EKG NSR with rate of 82 and 2 PVC Will refer to cardiology for eval inst to go to ER if symptoms return and do not quickly resolve Story is worrisome for angina

## 2014-08-18 NOTE — Progress Notes (Signed)
Pre visit review using our clinic review tool, if applicable. No additional management support is needed unless otherwise documented below in the visit note. 

## 2014-08-20 NOTE — Assessment & Plan Note (Signed)
Ongoing On GERD med w/o gi symptoms  Will try zyrtec for post nasal drip  Update - if no imp will need ENT referral

## 2014-08-20 NOTE — Assessment & Plan Note (Signed)
Medial pain and tenderness Disc poss of OA or meniscal problem Xray today

## 2014-08-28 ENCOUNTER — Ambulatory Visit
Admission: RE | Admit: 2014-08-28 | Discharge: 2014-08-28 | Disposition: A | Discharge status: Home / Self Care | Payer: Medicare Other | Source: Ambulatory Visit

## 2014-08-28 DIAGNOSIS — Z1231 Encounter for screening mammogram for malignant neoplasm of breast: Secondary | ICD-10-CM

## 2014-09-04 ENCOUNTER — Ambulatory Visit (INDEPENDENT_AMBULATORY_CARE_PROVIDER_SITE_OTHER): Payer: Medicare Other | Admitting: Family Medicine

## 2014-09-04 ENCOUNTER — Encounter: Payer: Self-pay | Admitting: Family Medicine

## 2014-09-04 VITALS — BP 90/60 | HR 82 | Temp 97.9°F | Ht 63.75 in | Wt 160.5 lb

## 2014-09-04 DIAGNOSIS — M25562 Pain in left knee: Secondary | ICD-10-CM

## 2014-09-04 DIAGNOSIS — M7052 Other bursitis of knee, left knee: Secondary | ICD-10-CM

## 2014-09-04 DIAGNOSIS — IMO0002 Reserved for concepts with insufficient information to code with codable children: Secondary | ICD-10-CM | POA: Diagnosis not present

## 2014-09-04 DIAGNOSIS — M25569 Pain in unspecified knee: Secondary | ICD-10-CM

## 2014-09-04 MED ORDER — METHYLPREDNISOLONE ACETATE 40 MG/ML IJ SUSP
20.0000 mg | Freq: Once | INTRAMUSCULAR | Status: AC
  Administered 2014-09-04: 20 mg via INTRA_ARTICULAR

## 2014-09-04 NOTE — Progress Notes (Signed)
Dr. Frederico Hamman T. Kodi Steil, MD, [facility] [place], [address] Phone: [phone] Fax: [phone]  09/04/2014  Patient: Mary Gordon, MRN: [id number], DOB: May 02, 1946, 68 y.o.  Primary Physician:  Loura Pardon, MD  Chief Complaint: Knee Pain  Subjective:   Mary Gordon is a 68 y.o. very pleasant female patient who presents with the following:  12 - 18 months L knee has been hurting. Was always sitting on it with knee bent. Some heat. Has tried some alleve. The patient is very active, and she actually still splayssports regularly. She has not had any symptomatic giving way, and she has not had any locking up in her knee. She has never had any previous traumatic injury, fracture, or operative intervention in the LEFT knee.  The onset of this was generally insidious in nature. She does routinely stretch, and her knee range of motion is remarkablygood.  Past Medical History, Surgical History, Social History, Family History, Problem List, Medications, and Allergies have been reviewed and updated if relevant.  GEN: No fevers, chills. Nontoxic. Primarily MSK c/o today. MSK: Detailed in the HPI GI: tolerating PO intake without difficulty Neuro: No numbness, parasthesias, or tingling associated. Otherwise the pertinent positives of the ROS are noted above.   Objective:   BP 90/60  Pulse 82  Temp(Src) 97.9 F (36.6 C) (Oral)  Ht 5' 3.75" (1.619 m)  Wt 160 lb 8 oz (72.802 kg)  BMI 27.77 kg/m2   GEN: WDWN, NAD, Non-toxic, Alert & Oriented x 3 HEENT: Atraumatic, Normocephalic.  Ears and Nose: No external deformity. EXTR: No clubbing/cyanosis/edema NEURO: Normal gait.  PSYCH: Normally interactive. Conversant. Not depressed or anxious appearing.  Calm demeanor.   Knee:  L Gait: Normal heel toe pattern ROM: 0-130 Effusion: neg Echymosis or edema: none Patellar tendon NT Painful PLICA: neg PES BURSA IS  TTP Patellar grind: negative Medial and lateral patellar facet loading: negative medial and lateral joint lines: mild medial joint line pain Mcmurray's neg Flexion-pinch minimal pain Varus and valgus stress: stable Lachman: neg Ant and Post drawer: neg Hip abduction, IR, ER: WNL Hip flexion str: 5/5 Hip abd: 5/5 Quad: 5/5 VMO atrophy:No Hamstring concentric and eccentric: 5/5   Radiology: Dg Knee Complete 4 Views Left  08/18/2014   CLINICAL DATA:  Knee pain.  EXAM: LEFT KNEE - COMPLETE 4+ VIEW  COMPARISON:  None.  FINDINGS: Soft tissue structures are unremarkable. No evidence of effusion. No evidence of fracture or dislocation.  IMPRESSION: No acute or focal abnormality identified.   Electronically Signed   By: Marcello Moores  Register   On: 08/18/2014 16:04    Assessment and Plan:   Pes anserinus bursitis of left knee - Plan: methylPREDNISolone acetate (DEPO-MEDROL) injection 20 mg  Left knee pain  He certainly has got a very symptomatic case if pes bursitis.   i thank on my review of her nonweightbearing knee films of the patient does have some minimal amount of osteoarthritic change in the medial compartment. I think that that would most likely explained the pain on the medial joint line without any clear injury.  We also reviewed pes bursitis treatment.   I am doubtful that the patient has a significant meniscal tear.Her knee range of motion is remarkable, and she can bend her knee to approximately 130-135, which is unusual a 68 year old.  Pes Anserine Bursitis Injection Verbal consent was obtained. Risks (including rare infection, skin lightening, and potential atrophy), benefits, and alternatives  explained. Sterilely prepped with Chloraprep. Ethyl chloride for anesthesia. Under sterile conditions, 1/2 cc of Lidocaine 1% and 1/2 cc of Depo-Medrol 40 mg injected directly on the pes anserinus perpendicularly taking the needle to bone then slightly withdrawing. No resistance  encountered. No complications with procedure and tolerated well. Patient had decreased pain post-injection. 22 gauge 1 1/2 inch needle   Follow-up: prn  Signed,  Kasara Schomer T. Oanh Devivo, MD   Patient's Medications  New Prescriptions   No medications on file  Previous Medications   DIPHENHYDRAMINE (BENADRYL) 25 MG TABLET    Take 25 mg by mouth every 6 (six) hours as needed.   ESTRADIOL (ESTRACE) 1 MG TABLET    Take 1 tablet (1 mg total) by mouth daily.   MOMETASONE (NASONEX) 50 MCG/ACT NASAL SPRAY    2 sprays in each nostril once daily as needed.   OMEPRAZOLE (PRILOSEC) 40 MG CAPSULE    TAKE 1 CAPSULE (40 MG TOTAL) BY MOUTH 2 (TWO) TIMES DAILY.   SERTRALINE (ZOLOFT) 50 MG TABLET    TAKE 1 TABLET EVERY DAY   TRAZODONE (DESYREL) 100 MG TABLET    TAKE 1 TABLET BY MOUTH AT BEDTIME.  Modified Medications   No medications on file  Discontinued Medications   No medications on file

## 2014-09-04 NOTE — Progress Notes (Signed)
Pre visit review using our clinic review tool, if applicable. No additional management support is needed unless otherwise documented below in the visit note. 

## 2014-09-08 ENCOUNTER — Ambulatory Visit (INDEPENDENT_AMBULATORY_CARE_PROVIDER_SITE_OTHER): Payer: Medicare Other | Admitting: Obstetrics & Gynecology

## 2014-09-08 ENCOUNTER — Encounter: Payer: Self-pay | Admitting: Obstetrics & Gynecology

## 2014-09-08 VITALS — BP 102/47 | HR 43 | Ht 64.0 in | Wt 158.0 lb

## 2014-09-08 DIAGNOSIS — Z13820 Encounter for screening for osteoporosis: Secondary | ICD-10-CM | POA: Diagnosis not present

## 2014-09-08 DIAGNOSIS — R001 Bradycardia, unspecified: Secondary | ICD-10-CM

## 2014-09-08 DIAGNOSIS — Z0000 Encounter for general adult medical examination without abnormal findings: Secondary | ICD-10-CM | POA: Diagnosis not present

## 2014-09-08 DIAGNOSIS — Z23 Encounter for immunization: Secondary | ICD-10-CM | POA: Diagnosis not present

## 2014-09-08 DIAGNOSIS — I498 Other specified cardiac arrhythmias: Secondary | ICD-10-CM | POA: Diagnosis not present

## 2014-09-08 NOTE — Progress Notes (Signed)
Subjective:    Mary Gordon is a 68 y.o. female who presents for an annual exam. She complains of fatigue and some dizziness. She has a cardiology appt this month. Her pulse today is 43 and her SBP is 102. She patient is sexually active. GYN screening history: last pap: was normal. The patient wears seatbelts: yes. The patient participates in regular exercise: yes. (pickleball!) Has the patient ever been transfused or tattooed?: no. The patient reports that there is not domestic violence in her life.   Menstrual History: OB History   Grav Para Term Preterm Abortions TAB SAB Ect Mult Living   4 1              Menarche age: 80  No LMP recorded. Patient has had a hysterectomy.    The following portions of the patient's history were reviewed and updated as appropriate: allergies, current medications, past family history, past medical history, past social history, past surgical history and problem list.  Review of Systems A comprehensive review of systems was negative. Mammogram UTD.   Objective:    BP 102/47  Pulse 43  Ht 5\' 4"  (1.626 m)  Wt 158 lb (71.668 kg)  BMI 27.11 kg/m2  General Appearance:    Alert, cooperative, no distress, appears stated age  Head:    Normocephalic, without obvious abnormality, atraumatic  Eyes:    PERRL, conjunctiva/corneas clear, EOM's intact, fundi    benign, both eyes  Ears:    Normal TM's and external ear canals, both ears  Nose:   Nares normal, septum midline, mucosa normal, no drainage    or sinus tenderness  Throat:   Lips, mucosa, and tongue normal; teeth and gums normal  Neck:   Supple, symmetrical, trachea midline, no adenopathy;    thyroid:  no enlargement/tenderness/nodules; no carotid   bruit or JVD  Back:     Symmetric, no curvature, ROM normal, no CVA tenderness  Lungs:     Clear to auscultation bilaterally, respirations unlabored  Chest Wall:    No tenderness or deformity   Heart:    Regular rate and rhythm, S1 and S2 normal, no  murmur, rub   or gallop  Breast Exam:    No tenderness, masses, or nipple abnormality  Abdomen:     Soft, non-tender, bowel sounds active all four quadrants,    no masses, no organomegaly  Genitalia:    Normal female without lesion, discharge or tenderness, minimal atrophy, normal vagina and cuff. No masses with bimanual exam     Extremities:   Extremities normal, atraumatic, no cyanosis or edema  Pulses:   2+ and symmetric all extremities  Skin:   Skin color, texture, turgor normal, no rashes or lesions  Lymph nodes:   Cervical, supraclavicular, and axillary nodes normal  Neurologic:   CNII-XII intact, normal strength, sensation and reflexes    throughout  .    Assessment:    Healthy female exam.    Plan:     Breast self exam technique reviewed and patient encouraged to perform self-exam monthly.  Schedule DEXA Flu vaccine today Check TSH

## 2014-09-09 LAB — TSH: TSH: 2.53 u[IU]/mL (ref 0.350–4.500)

## 2014-09-25 ENCOUNTER — Other Ambulatory Visit: Payer: Self-pay | Admitting: Obstetrics & Gynecology

## 2014-09-26 ENCOUNTER — Ambulatory Visit (INDEPENDENT_AMBULATORY_CARE_PROVIDER_SITE_OTHER): Payer: Medicare Other | Admitting: Cardiology

## 2014-09-26 ENCOUNTER — Encounter: Payer: Self-pay | Admitting: Cardiology

## 2014-09-26 VITALS — BP 110/70 | HR 76 | Ht 64.0 in | Wt 159.0 lb

## 2014-09-26 DIAGNOSIS — R002 Palpitations: Secondary | ICD-10-CM | POA: Diagnosis not present

## 2014-09-26 DIAGNOSIS — R0602 Shortness of breath: Secondary | ICD-10-CM | POA: Diagnosis not present

## 2014-09-26 DIAGNOSIS — E7800 Pure hypercholesterolemia, unspecified: Secondary | ICD-10-CM

## 2014-09-26 DIAGNOSIS — R079 Chest pain, unspecified: Secondary | ICD-10-CM | POA: Diagnosis not present

## 2014-09-26 DIAGNOSIS — I493 Ventricular premature depolarization: Secondary | ICD-10-CM | POA: Insufficient documentation

## 2014-09-26 DIAGNOSIS — R0609 Other forms of dyspnea: Secondary | ICD-10-CM | POA: Insufficient documentation

## 2014-09-26 NOTE — Progress Notes (Signed)
[address] [place], [place]  [address] Phone: [phone] Fax:  [phone]  Date:  09/26/2014   ID:  Mary Gordon, DOB [birth year], MRN [id number]  PCP:  Loura Pardon, MD  Cardiologist:  Fransico Him, MD    History of Present Illness: This is a 68yo WF with a history of COPD, palpitations, fibromyalgia and dyslipidemia who presents with complaints of SOB.  This can occur with exertion or at rest.  She has not had any episodes since September.  Before that she had had about 6 episodes.  With the SOB she will break out in a sweat and get dizzy but did not have any presyncope or syncope.  She has a history of PVC's and thinks that they are becoming more frequent. She denies any palpitations during the episodes of SOB.  She has noticed intermittent episodes of chest heaviness, sometimes occurring with the SOB and at other times without the SOB.  The chest heaviness can occur when she exerts herself but more noticeable when she is lying down and feels her PVC's.    Wt Readings from Last 3 Encounters:  09/26/14 159 lb (72.122 kg)  09/08/14 158 lb (71.668 kg)  09/04/14 160 lb 8 oz (72.802 kg)     Past Medical History  Diagnosis Date  . Palpitations   . High cholesterol   . Panic disorder   . Depression   . Osteopenia   . Vitamin D deficiency   . History of Clostridium difficile infection     In past from augmentin  . COPD (chronic obstructive pulmonary disease)     ? of on cxr in 9/09, then nl pfts with no obstr or restrictive dz noted  . Pulmonary nodule     Being observed  . Allergy     all year  . Cervical cancer 1978  . Cataract   . Anxiety   . GERD (gastroesophageal reflux disease)   . Fibromyalgia   . Bursitis of hip     Current Outpatient Prescriptions  Medication Sig Dispense Refill  . Cetirizine HCl (ZYRTEC ALLERGY PO) Take 1 tablet by mouth daily.      . diphenhydrAMINE (BENADRYL) 25 MG tablet Take 25 mg by mouth every 6 (six) hours as needed.       Marland Kitchen estradiol (ESTRACE) 1 MG tablet Take 1 tablet (1 mg total) by mouth daily.  100 tablet  5  . mometasone (NASONEX) 50 MCG/ACT nasal spray 2 sprays in each nostril once daily as needed.  17 g  11  . omeprazole (PRILOSEC) 40 MG capsule TAKE 1 CAPSULE (40 MG TOTAL) BY MOUTH 2 (TWO) TIMES DAILY.  60 capsule  3  . sertraline (ZOLOFT) 50 MG tablet TAKE 1 TABLET EVERY DAY  90 tablet  3  . traZODone (DESYREL) 100 MG tablet TAKE 1 TABLET BY MOUTH AT BEDTIME.  90 tablet  3   No current facility-administered medications for this visit.    Allergies:    Allergies  Allergen Reactions  . Amoxicillin-Pot Clavulanate Other (See Comments)    Cdiff  . Simvastatin     myalgias    Social History:  The patient  reports that she quit smoking about 35 years ago. Her smoking use included Cigarettes. She has a 12.8 pack-year smoking history. She has never used smokeless tobacco. She reports that she does not drink alcohol or use illicit drugs.   Family History:  The patient's family history includes Allergies in her father,  mother, and other; Arrhythmia in her sister; Asthma in her mother and other; Breast cancer in her other; Cancer in her sister; Diabetes in her other; Emphysema in her mother and sister; Glaucoma in her other; Heart attack (age of onset: 85) in her mother; Heart disease in her sister; Hyperlipidemia in her other; Hypertension in her other; Lung cancer in her sister; Rheumatologic disease in her sister. There is no history of Colon cancer, Esophageal cancer, Rectal cancer, or Stomach cancer.   ROS:  Please see the history of present illness.      All other systems reviewed and negative.   PHYSICAL EXAM: VS:  BP 110/70  Pulse 76  Ht 5\' 4"  (1.626 m)  Wt 159 lb (72.122 kg)  BMI 27.28 kg/m2 Well nourished, well developed, in no acute distress HEENT: normal Neck: no JVD Cardiac:  normal S1, S2; RRR; no murmur Lungs:  clear to auscultation bilaterally, no wheezing, rhonchi or rales Abd:  soft, nontender, no hepatomegaly Ext: no edema Skin: warm and dry Neuro:  CNs 2-12 intact, no focal abnormalities noted  EKG:  NSR with PVC's  ASSESSMENT AND PLAN:  1. SOB of ? Etiology - it can occur with exertion of just sitting. This may be related to her PVC's.  It is somewhat concerning in that she will get it some when going up stairs and breaks out in a sweat and gets dizzy. - 2D echo to assess LVF - Stress myoview to rule out ischemia 2. Atypical chest pain that seems to occur at different times including with exertion, with PVC's and at rest.  [facility] does have a history of fibromyalgia 3. PVC's that have increased in frequency recently - I will get a 48 hour Holter to assess PVC load - check BMET 4. Fibromyalgia 5. Panic Disorder  Followup with me PRN  Signed, Fransico Him, MD [facility] 09/26/2014 4:38 PM

## 2014-09-26 NOTE — Patient Instructions (Addendum)
Your physician has requested that you have an echocardiogram. Echocardiography is a painless test that uses sound waves to create images of your heart. It provides your doctor with information about the size and shape of your heart and how well your heart's chambers and valves are working. This procedure takes approximately one hour. There are no restrictions for this procedure.  Your physician has recommended that you wear a holter monitor. Holter monitors are medical devices that record the heart's electrical activity. Doctors most often use these monitors to diagnose arrhythmias. Arrhythmias are problems with the speed or rhythm of the heartbeat. The monitor is a small, portable device. You can wear one while you do your normal daily activities. This is usually used to diagnose what is causing palpitations/syncope (passing out).  Your physician has requested that you have en exercise stress myoview. For further information please visit [email or web address]. Please follow instruction sheet, as given.

## 2014-09-27 LAB — LIPID PANEL
Cholesterol: 239 mg/dL — ABNORMAL HIGH (ref 0–200)
HDL: 67 mg/dL (ref >39.00)
LDL Cholesterol: 147 mg/dL — ABNORMAL HIGH (ref 0–99)
NonHDL: 172
Total CHOL/HDL Ratio: 4
Triglycerides: 124 mg/dL (ref 0.0–149.0)
VLDL: 24.8 mg/dL (ref 0.0–40.0)

## 2014-09-27 LAB — BASIC METABOLIC PANEL
BUN: 9 mg/dL (ref 6–23)
CALCIUM: 9.4 mg/dL (ref 8.4–10.5)
CO2: 27 mEq/L (ref 19–32)
Chloride: 105 mEq/L (ref 96–112)
Creatinine, Ser: 1 mg/dL (ref 0.4–1.2)
GFR: 60.7 mL/min (ref >60.00)
GLUCOSE: 99 mg/dL (ref 70–99)
Potassium: 3.9 mEq/L (ref 3.5–5.1)
SODIUM: 140 meq/L (ref 135–145)

## 2014-09-27 LAB — HEPATIC FUNCTION PANEL
ALT: 16 U/L (ref 0–35)
AST: 20 U/L (ref 0–37)
Albumin: 3.6 g/dL (ref 3.5–5.2)
Alkaline Phosphatase: 87 U/L (ref 39–117)
Bilirubin, Direct: 0.1 mg/dL (ref 0.0–0.3)
Total Bilirubin: 0.4 mg/dL (ref 0.2–1.2)
Total Protein: 7.2 g/dL (ref 6.0–8.3)

## 2014-10-06 ENCOUNTER — Ambulatory Visit (HOSPITAL_COMMUNITY): Payer: Medicare Other | Attending: Cardiology

## 2014-10-06 ENCOUNTER — Ambulatory Visit (HOSPITAL_BASED_OUTPATIENT_CLINIC_OR_DEPARTMENT_OTHER): Payer: Medicare Other | Admitting: Radiology

## 2014-10-06 ENCOUNTER — Encounter: Payer: Self-pay | Admitting: Radiology

## 2014-10-06 ENCOUNTER — Encounter (INDEPENDENT_AMBULATORY_CARE_PROVIDER_SITE_OTHER): Payer: Medicare Other

## 2014-10-06 VITALS — BP 126/82 | HR 88 | Ht 64.0 in | Wt 159.0 lb

## 2014-10-06 DIAGNOSIS — R002 Palpitations: Secondary | ICD-10-CM

## 2014-10-06 DIAGNOSIS — F329 Major depressive disorder, single episode, unspecified: Secondary | ICD-10-CM | POA: Insufficient documentation

## 2014-10-06 DIAGNOSIS — I493 Ventricular premature depolarization: Secondary | ICD-10-CM | POA: Diagnosis not present

## 2014-10-06 DIAGNOSIS — R0602 Shortness of breath: Secondary | ICD-10-CM

## 2014-10-06 DIAGNOSIS — Z87891 Personal history of nicotine dependence: Secondary | ICD-10-CM | POA: Diagnosis not present

## 2014-10-06 DIAGNOSIS — F410 Panic disorder [episodic paroxysmal anxiety] without agoraphobia: Secondary | ICD-10-CM | POA: Insufficient documentation

## 2014-10-06 DIAGNOSIS — M8580 Other specified disorders of bone density and structure, unspecified site: Secondary | ICD-10-CM | POA: Diagnosis not present

## 2014-10-06 DIAGNOSIS — R079 Chest pain, unspecified: Secondary | ICD-10-CM | POA: Diagnosis present

## 2014-10-06 DIAGNOSIS — I340 Nonrheumatic mitral (valve) insufficiency: Secondary | ICD-10-CM | POA: Diagnosis not present

## 2014-10-06 DIAGNOSIS — I517 Cardiomegaly: Secondary | ICD-10-CM | POA: Insufficient documentation

## 2014-10-06 DIAGNOSIS — Z8541 Personal history of malignant neoplasm of cervix uteri: Secondary | ICD-10-CM | POA: Insufficient documentation

## 2014-10-06 DIAGNOSIS — J449 Chronic obstructive pulmonary disease, unspecified: Secondary | ICD-10-CM | POA: Diagnosis not present

## 2014-10-06 DIAGNOSIS — R0600 Dyspnea, unspecified: Secondary | ICD-10-CM | POA: Diagnosis not present

## 2014-10-06 MED ORDER — TECHNETIUM TC 99M SESTAMIBI GENERIC - CARDIOLITE
30.0000 | Freq: Once | INTRAVENOUS | Status: AC | PRN
  Administered 2014-10-06: 30 via INTRAVENOUS

## 2014-10-06 MED ORDER — TECHNETIUM TC 99M SESTAMIBI GENERIC - CARDIOLITE
10.0000 | Freq: Once | INTRAVENOUS | Status: AC | PRN
  Administered 2014-10-06: 10 via INTRAVENOUS

## 2014-10-06 NOTE — Progress Notes (Signed)
2D Echo completed. 10/06/2014

## 2014-10-06 NOTE — Progress Notes (Signed)
[facility] 3 NUCLEAR MED [address] [place],  [address] [phone]    Cardiology Nuclear Med Study  Mary Gordon is a 68 y.o. female     MRN : [id number]     DOB: [birth year]  Procedure Date: 10/06/2014  Nuclear Med Background Indication for Stress Test:  Evaluation for Ischemia History:  2012 MPI-nml, EF 79% Cardiac Risk Factors: n/a  Symptoms:  Chest Pain, Light-Headedness, Palpitations and SOB   Nuclear Pre-Procedure Caffeine/Decaff Intake:  None NPO After: 8:00pm   Lungs:  clear O2 Sat: 99% on room air. IV 0.9% NS with Angio Cath:  22g  IV Site: R Hand  IV Started by:  Annye Rusk, CNMT  Chest Size (in):  36 Cup Size: C  Height: 5\' 4"  (1.626 m)  Weight:  159 lb (72.122 kg)  BMI:  Body mass index is 27.28 kg/(m^2). Tech Comments:  n/a    Nuclear Med Study 1 or 2 day study: 1 day  Stress Test Type:  Stress  Reading MD: Dola Argyle, MD  Order Authorizing Provider:  T. Turner, MD  Resting Radionuclide: Technetium 75m Sestamibi  Resting Radionuclide Dose: 11.0 mCi   Stress Radionuclide:  Technetium 80m Sestamibi  Stress Radionuclide Dose: 33.0 mCi           Stress Protocol Rest HR: 88 Stress HR: 134  Rest BP: 126/82 Stress BP: 139/73  Exercise Time (min): 6:00 METS: 7.0   Predicted Max HR: 152 bpm % Max HR: 88.16 bpm Rate Pressure Product: 19296   Dose of Adenosine (mg):  n/a Dose of Lexiscan: n/a mg  Dose of Atropine (mg): n/a Dose of Dobutamine: n/a mcg/kg/min (at max HR)  Stress Test Technologist: Glade Lloyd, BS-ES  Nuclear Technologist:  Earl Many, CNMT     Rest Procedure:  Myocardial perfusion imaging was performed at rest 45 minutes following the intravenous administration of Technetium 33m Sestamibi. Rest ECG: Normal sinus rhythm. Mild nonspecific ST changes. Frequent PVCs.  Stress Procedure:  The patient exercised on the treadmill utilizing the Bruce Protocol for 6:00 minutes. The patient stopped due to fatigue  and 2-3/10 chest tightness.  Technetium 13m Sestamibi was injected at peak exercise and myocardial perfusion imaging was performed after a brief delay.  Stress ECG: No significant change from baseline ECG  QPS Raw Data Images:  Normal; no motion artifact; normal heart/lung ratio. Stress Images:  Normal homogeneous uptake in all areas of the myocardium. Rest Images:  Normal homogeneous uptake in all areas of the myocardium. Subtraction (SDS):  No evidence of ischemia. Transient Ischemic Dilatation (Normal <1.22):  0.84 Lung/Heart Ratio (Normal <0.45):  0.29  Quantitative Gated Spect Images QGS EDV:  n/a QGS ESV:  n/a  Impression Exercise Capacity:  Fair exercise capacity. BP Response:  Normal blood pressure response. Clinical Symptoms:  2/10 chest tightness. ECG Impression:  No significant ST segment change suggestive of ischemia. Comparison with Prior Nuclear Study: This study is compared with the report from the study from July, 2012  Overall Impression:  Normal stress nuclear study.  There is no scar or ischemia. The study in July, 2012 was also read as normal. The patient has frequent PVCs at rest. With stress on the treadmill the PVCs continued at a similar rate. There was no increase. There were no couplets or ventricular tachycardia. However the PVCs did not disappear completely with walking. Because of the PVCs, the study could not be gated. Therefore, risk assessment cannot be done because there is  no wall motion data.  LV Ejection Fraction: Study not gated.  LV Wall Motion:  Study not gated.  Dola Argyle, MD

## 2014-10-06 NOTE — Progress Notes (Signed)
Patient ID: Mary Gordon, female   DOB: October 16, 1946, 68 y.o.   MRN: [id number] Lab Copr 48hr holter applied

## 2014-10-09 ENCOUNTER — Encounter: Payer: Self-pay | Admitting: Cardiology

## 2014-10-10 ENCOUNTER — Telehealth: Payer: Self-pay

## 2014-10-10 NOTE — Telephone Encounter (Signed)
Informed patient of test results.  ECHO showed normal LVF with mildly leaky MV and moderately elevated BP in blood vessels in the lungs.  PFTs and chest CT to be ordered. Patient agrees with treatment plan.

## 2014-10-10 NOTE — Telephone Encounter (Signed)
-----   Message from Sueanne Margarita, MD sent at 10/06/2014  1:26 PM EDT ----- Please let patient know that echo showed normal LVF with mildly leaky MV and moderately elevated BP in blood vessels in the lungs.  Please order PFTS to reassess for COPD and also get Chest CT to rule out PE.  If these are normal then will proceed with sleep study

## 2014-10-11 ENCOUNTER — Other Ambulatory Visit: Payer: Self-pay

## 2014-10-11 DIAGNOSIS — R0602 Shortness of breath: Secondary | ICD-10-CM

## 2014-10-11 DIAGNOSIS — I2699 Other pulmonary embolism without acute cor pulmonale: Secondary | ICD-10-CM

## 2014-10-11 DIAGNOSIS — J449 Chronic obstructive pulmonary disease, unspecified: Secondary | ICD-10-CM

## 2014-10-11 DIAGNOSIS — R079 Chest pain, unspecified: Secondary | ICD-10-CM

## 2014-10-11 NOTE — Telephone Encounter (Signed)
Spoke with Mary Gordon in [place] who st 1430 and 1530 are open this after noon for CT.  Left message on patient's voicemail to call back as soon as possible.

## 2014-10-11 NOTE — Telephone Encounter (Signed)
Spoke with Lattie Haw in [place] and obtained available times for patient to come in tomorrow. Confirmed appointment time for chest CT for 0830 tomorrow morning.

## 2014-10-12 ENCOUNTER — Ambulatory Visit (INDEPENDENT_AMBULATORY_CARE_PROVIDER_SITE_OTHER)
Admission: RE | Admit: 2014-10-12 | Discharge: 2014-10-12 | Disposition: A | Discharge status: Home / Self Care | Payer: Medicare Other | Source: Ambulatory Visit | Attending: Cardiology | Admitting: Cardiology

## 2014-10-12 DIAGNOSIS — R0602 Shortness of breath: Secondary | ICD-10-CM

## 2014-10-12 DIAGNOSIS — R079 Chest pain, unspecified: Secondary | ICD-10-CM | POA: Diagnosis not present

## 2014-10-12 DIAGNOSIS — R42 Dizziness and giddiness: Secondary | ICD-10-CM | POA: Diagnosis not present

## 2014-10-12 DIAGNOSIS — I2699 Other pulmonary embolism without acute cor pulmonale: Secondary | ICD-10-CM | POA: Diagnosis not present

## 2014-10-12 MED ORDER — IOHEXOL 350 MG/ML SOLN
100.0000 mL | Freq: Once | INTRAVENOUS | Status: AC | PRN
  Administered 2014-10-12: 100 mL via INTRAVENOUS

## 2014-10-16 ENCOUNTER — Telehealth: Payer: Self-pay | Admitting: Cardiology

## 2014-10-16 ENCOUNTER — Ambulatory Visit (INDEPENDENT_AMBULATORY_CARE_PROVIDER_SITE_OTHER): Payer: Medicare Other | Admitting: Internal Medicine

## 2014-10-16 DIAGNOSIS — R0602 Shortness of breath: Secondary | ICD-10-CM

## 2014-10-16 DIAGNOSIS — J449 Chronic obstructive pulmonary disease, unspecified: Secondary | ICD-10-CM

## 2014-10-16 LAB — PULMONARY FUNCTION TEST
DL/VA % PRED: 72 %
DL/VA: 3.47 ml/min/mmHg/L
DLCO unc % pred: 78 %
DLCO unc: 19.13 ml/min/mmHg
FEF 25-75 PRE: 1.57 L/s
FEF 25-75 Post: 1.79 L/sec
FEF2575-%Change-Post: 13 %
FEF2575-%PRED-PRE: 78 %
FEF2575-%Pred-Post: 89 %
FEV1-%Change-Post: 3 %
FEV1-%Pred-Post: 110 %
FEV1-%Pred-Pre: 107 %
FEV1-Post: 2.58 L
FEV1-Pre: 2.5 L
FEV1FVC-%Change-Post: 3 %
FEV1FVC-%Pred-Pre: 93 %
FEV6-%CHANGE-POST: 0 %
FEV6-%PRED-POST: 116 %
FEV6-%PRED-PRE: 116 %
FEV6-PRE: 3.41 L
FEV6-Post: 3.42 L
FEV6FVC-%Change-Post: 0 %
FEV6FVC-%Pred-Post: 101 %
FEV6FVC-%Pred-Pre: 101 %
FVC-%Change-Post: 0 %
FVC-%Pred-Post: 114 %
FVC-%Pred-Pre: 114 %
FVC-POST: 3.51 L
FVC-PRE: 3.51 L
POST FEV6/FVC RATIO: 97 %
Post FEV1/FVC ratio: 73 %
Pre FEV1/FVC ratio: 71 %
Pre FEV6/FVC Ratio: 97 %
RV % pred: 99 %
RV: 2.14 L
TLC % pred: 108 %
TLC: 5.5 L

## 2014-10-16 NOTE — Telephone Encounter (Signed)
Please let patient know that heart monitor showed PVC's, bigeminal PVC's and couplets with PVC load of 12.54%.  Please add Toprol 12.5 mg daily and followup with Richardson Dopp PA in 2 weeks to see how she is tolerating it

## 2014-10-16 NOTE — Telephone Encounter (Signed)
Patient had PFT's today and after having her albuterol during study states her chronic cough is better Patient wanted Dr Radford Pax to be made aware, will forward to her for review   Advised patient and agreeable to start Toprol

## 2014-10-16 NOTE — Progress Notes (Signed)
PFT done today. 

## 2014-10-17 NOTE — Telephone Encounter (Signed)
Will await results of PFTs

## 2014-10-19 ENCOUNTER — Other Ambulatory Visit: Payer: Self-pay | Admitting: *Deleted

## 2014-10-19 MED ORDER — METOPROLOL SUCCINATE ER 25 MG PO TB24: 12.5000 mg | ORAL_TABLET | Freq: Every day | ORAL | Status: DC

## 2014-10-20 ENCOUNTER — Ambulatory Visit (INDEPENDENT_AMBULATORY_CARE_PROVIDER_SITE_OTHER): Payer: Medicare Other | Admitting: Cardiology

## 2014-10-20 ENCOUNTER — Encounter: Payer: Self-pay | Admitting: Cardiology

## 2014-10-20 VITALS — BP 102/58 | HR 86 | Ht 64.0 in | Wt 163.2 lb

## 2014-10-20 DIAGNOSIS — I493 Ventricular premature depolarization: Secondary | ICD-10-CM

## 2014-10-20 DIAGNOSIS — R0602 Shortness of breath: Secondary | ICD-10-CM

## 2014-10-20 DIAGNOSIS — I272 Pulmonary hypertension, unspecified: Secondary | ICD-10-CM | POA: Insufficient documentation

## 2014-10-20 DIAGNOSIS — I270 Primary pulmonary hypertension: Secondary | ICD-10-CM

## 2014-10-20 HISTORY — DX: Pulmonary hypertension, unspecified: I27.20

## 2014-10-20 NOTE — Patient Instructions (Addendum)
Your physician has recommended that you have a sleep study. This test records several body functions during sleep, including: brain activity, eye movement, oxygen and carbon dioxide blood levels, heart rate and rhythm, breathing rate and rhythm, the flow of air through your mouth and nose, snoring, body muscle movements, and chest and belly movement.  Dr. Radford Pax recommends you see Dr. Gwenette Greet for shortness of breath, cough, and abnormal PFTs.   Your physician recommends that you schedule a follow-up appointment in: 3 months with Dr. Radford Pax.

## 2014-10-20 NOTE — Progress Notes (Signed)
[name], [place], [place]  [address] Phone: [phone] Fax:  [phone]  Date:  10/20/2014   ID:  Mary Gordon, DOB Sep 09, 1946, MRN [id number]  PCP:  Loura Pardon, MD  Cardiologist:  Fransico Him, MD    History of Present Illness: This is a 68yo WF with a history of COPD, palpitations with PVC's, fibromyalgia and dyslipidemia who presents for followup of SOB, chest pain and PVC's.  When I saw her last a 2D echo showed normal LVF with moderate pulmonary HTN with PASP 48mmHg. Nuclear stress test showed no ischemia.  Chest CT showed no abnormalities or PE.  PFTs showed minimal perfusion defect and mildly reduced DLCO.  She has albuterol given during her PFTs and her cough improved.  She has a history of PVC's and heart monitor showed PVC's including bigeminal PVC's with PVC load 12.5% and she was started on Toprol and just took the first pill today.  She says that she sleeps well but gets very tired during the day.      Wt Readings from Last 3 Encounters:  10/20/14 163 lb 3.2 oz (74.027 kg)  10/06/14 159 lb (72.122 kg)  09/26/14 159 lb (72.122 kg)     Past Medical History  Diagnosis Date  . Palpitations   . High cholesterol   . Panic disorder   . Depression   . Osteopenia   . Vitamin D deficiency   . History of Clostridium difficile infection     In past from augmentin  . COPD (chronic obstructive pulmonary disease)     ? of on cxr in 9/09, then nl pfts with no obstr or restrictive dz noted  . Pulmonary nodule     Being observed  . Allergy     all year  . Cervical cancer 1978  . Cataract   . Anxiety   . GERD (gastroesophageal reflux disease)   . Fibromyalgia   . Bursitis of hip   . Pulmonary HTN 10/20/2014    moderate with PASP 3mmHg    Current Outpatient Prescriptions  Medication Sig Dispense Refill  . Cetirizine HCl (ZYRTEC ALLERGY PO) Take 1 tablet by mouth daily.    . diphenhydrAMINE (BENADRYL) 25 MG tablet Take 25 mg by mouth every 6 (six)  hours as needed.    Marland Kitchen estradiol (ESTRACE) 1 MG tablet TAKE 1 TABLET (1 MG TOTAL) BY MOUTH DAILY. 100 tablet 4  . metoprolol succinate (TOPROL-XL) 25 MG 24 hr tablet Take 0.5 tablets (12.5 mg total) by mouth daily. 15 tablet 3  . mometasone (NASONEX) 50 MCG/ACT nasal spray 2 sprays in each nostril once daily as needed. 17 g 11  . omeprazole (PRILOSEC) 40 MG capsule TAKE 1 CAPSULE (40 MG TOTAL) BY MOUTH 2 (TWO) TIMES DAILY. 60 capsule 3  . sertraline (ZOLOFT) 50 MG tablet TAKE 1 TABLET EVERY DAY 90 tablet 3  . traZODone (DESYREL) 100 MG tablet TAKE 1 TABLET BY MOUTH AT BEDTIME. 90 tablet 3   No current facility-administered medications for this visit.    Allergies:    Allergies  Allergen Reactions  . Amoxicillin-Pot Clavulanate Other (See Comments)    Cdiff  . Simvastatin     myalgias    Social History:  The patient  reports that she quit smoking about 35 years ago. Her smoking use included Cigarettes. She has a 12.8 pack-year smoking history. She has never used smokeless tobacco. She reports that she does not drink alcohol or use illicit drugs.  Family History:  The patient's family history includes Allergies in her father, mother, and other; Arrhythmia in her sister; Asthma in her mother and other; Breast cancer in her other; Cancer in her sister; Diabetes in her other; Emphysema in her mother and sister; Glaucoma in her other; Heart attack (age of onset: 16) in her mother; Heart disease in her sister; Hyperlipidemia in her other; Hypertension in her other; Lung cancer in her sister; Rheumatologic disease in her sister. There is no history of Colon cancer, Esophageal cancer, Rectal cancer, or Stomach cancer.   ROS:  Please see the history of present illness.      All other systems reviewed and negative.   PHYSICAL EXAM: VS:  BP 102/58 mmHg  Pulse 86  Ht 5\' 4"  (1.626 m)  Wt 163 lb 3.2 oz (74.027 kg)  BMI 28.00 kg/m2  SpO2 98% Well nourished, well developed, in no acute  distress HEENT: normal Neck: no JVD Cardiac:  normal S1, S2; RRR; no murmur Lungs:  clear to auscultation bilaterally, no wheezing, rhonchi or rales Abd: soft, nontender, no hepatomegaly Ext: no edema Skin: warm and dry Neuro:  CNs 2-12 intact, no focal abnormalities noted  ASSESSMENT AND PLAN:  1.  SOB which is associated with cough that improved when she was given albuterol.  There has been some question of COPD in the past and now PFTs show possible COPD.  She also may have GERD that is triggering her cough but is already on a PPI.  I have recommended that she see Dr. Gwenette Greet back again for further evaluation 2.  Atypical chest pain that seems to occur at different times including with exertion, with PVC's and at rest. [facility] does have a history of fibromyalgia.  Her nuclear stress test showed no ischemia and no coronary artery calcifications on chest CT. 3.  PVC's that have increased in frequency recently with 12.5% PVC load on Holter.  She started on Toprol low dose this am 4.  Fibromyalgia 5.  Panic Disorder   6.  Moderate pulmonary HTN with PASP 43mmHg.  ? Whether this is from COPD.  Her Chest CT showed no evidence of PE.  I will get a sleep study to rule out sleep apnea in light of her daytime sleepiness   Followup with PA in 3-4 weeks  Followup with me in 3 months    Signed, Fransico Him, MD [facility] 10/20/2014 11:46 AM

## 2014-10-23 DIAGNOSIS — H43813 Vitreous degeneration, bilateral: Secondary | ICD-10-CM | POA: Diagnosis not present

## 2014-11-10 ENCOUNTER — Ambulatory Visit: Payer: Medicare Other | Admitting: Internal Medicine

## 2014-11-13 ENCOUNTER — Telehealth: Payer: Self-pay | Admitting: Cardiology

## 2014-11-13 ENCOUNTER — Encounter: Payer: Self-pay | Admitting: Pulmonary Disease

## 2014-11-13 ENCOUNTER — Ambulatory Visit (INDEPENDENT_AMBULATORY_CARE_PROVIDER_SITE_OTHER): Payer: Medicare Other | Admitting: Pulmonary Disease

## 2014-11-13 VITALS — BP 122/62 | HR 73 | Temp 97.1°F | Ht 64.0 in | Wt 165.8 lb

## 2014-11-13 DIAGNOSIS — R0609 Other forms of dyspnea: Secondary | ICD-10-CM | POA: Diagnosis not present

## 2014-11-13 NOTE — Telephone Encounter (Signed)
Chest CT of chest with no abnormality.  Patient saw Dr. Gwenette Greet and felt that no significant pulmonary problems to explain pulmonary HTN was present.  Please set patient up for sleep study at [facility] sleep lab

## 2014-11-13 NOTE — Assessment & Plan Note (Signed)
The patient has dyspnea on exertion that has day-to-day variability, and can occur with mild to moderate activities, and not occur with heavy exertional activities. Her pulmonary function studies really showed no significant airflow obstruction, and no response to bronchodilators. However, the patient felt that she was a lot better for about 24 hours after taking albuterol during the study. She has no personal history of asthma, but her mother and sister both have asthma. She has pulmonary hypertension by echo, but has not had a right heart catheterization. Typically this would give more consistent dyspnea on exertion, but I think her workup needs to continue under the guidance of Dr. Radford Pax. Because of her ongoing symptoms, and the fact that she has day-to-day variability with different types of exertional activity, I think it is worthwhile doing a methacholine challenge test to put the issue of asthma to rest. She also has this cough that really sounds more upper airway than anything else, with continual throat clearing during our visit. She does have postnasal drip, and sinus symptoms at times. I have reviewed with her the behavioral therapies for upper airway coughing, and if continues, we may need to image her sinuses.

## 2014-11-13 NOTE — Patient Instructions (Signed)
Will schedule for methacholine challenge test to evaluate for occult asthma in light of your history of day to day variability in your shortness of breath.  Avoid throat clearing.  Use hard candy (no mint, menthol) during day to bathe the back of throat.  Do not overuse your voice. Try taking chlorpheniramine 4mg  OTC, 2 at bedtime along with zyrtec during the day. Will send a note to Dr. Radford Pax, letting her know that I do not see pulmonary disease to explain your elevated pulmonary pressures on echo.

## 2014-11-13 NOTE — Progress Notes (Signed)
   Subjective:    Patient ID: Mary Gordon, female    DOB: [birth year], 68 y.o.   MRN: [id number]  HPI The patient is a 68 year old female who I've been asked to see for dyspnea on exertion. The patient states this began approximately 2-3 months ago, but has not been progressive in nature. She notes day-to-day variability in her symptoms, and can play active sports without shortness of breath, but then turn around and have shortness of breath with walking up one flight of stairs or bringing a few bags of groceries in from the car. She also has a chronic cough that rarely produces significant mucus. The quantity is primarily scant, and it can be discolored at times. She does have postnasal drip, and also has intermittent sinus pressure with headache at times. She denies any significant reflux symptoms. The patient has a history of smoking one pack of cigarettes per day for 17 years, but has not smoked since 1980. She has no history of asthma, however her mother and sister to have a history of asthma. She has had a recent echocardiogram that showed a normal left ventricle, mild MR, mild left atrial enlargement, and a moderate elevation in her pulmonary artery pressures by estimation. She has had a CT of her chest that did not show any pulmonary embolus, a few linear scars, and basilar dependent atelectasis. There was no evidence for a pulmonary nodule or interstitial lung disease. She has had pulmonary function studies that showed no significant airflow obstruction, no restriction, and a minimal decrease in DLCO at 78% of predicted. She did not have a significant response to bronchodilators, but the patient tells me that she felt much better for 24 hours after using the albuterol.   Review of Systems  Constitutional: Negative for fever and unexpected weight change.  HENT: Positive for sore throat and trouble swallowing. Negative for congestion, dental problem, ear pain, nosebleeds, postnasal drip,  rhinorrhea, sinus pressure and sneezing.   Eyes: Negative for redness and itching.  Respiratory: Positive for cough, chest tightness and shortness of breath. Negative for wheezing.   Cardiovascular: Negative for palpitations and leg swelling.  Gastrointestinal: Negative for nausea and vomiting.  Genitourinary: Negative for dysuria.  Musculoskeletal: Negative for joint swelling.  Skin: Negative for rash.  Neurological: Negative for headaches.  Hematological: Does not bruise/bleed easily.  Psychiatric/Behavioral: Negative for dysphoric mood. The patient is not nervous/anxious.        Objective:   Physical Exam Constitutional:  Well developed, no acute distress  HENT:  Nares patent without discharge, septal deviation to the left  Oropharynx without exudate, palate and uvula are normal  Eyes:  Perrla, eomi, no scleral icterus  Neck:  No JVD, no TMG  Cardiovascular:  Normal rate, regular rhythm, no rubs or gallops.  No murmurs        Intact distal pulses  Pulmonary :  Normal breath sounds, no stridor or respiratory distress   No rales, rhonchi, or wheezing  Abdominal:  Soft, nondistended, bowel sounds present.  No tenderness noted.   Musculoskeletal:  mild lower extremity edema noted.  Lymph Nodes:  No cervical lymphadenopathy noted  Skin:  No cyanosis noted  Neurologic:  Alert, appropriate, moves all 4 extremities without obvious deficit.         Assessment & Plan:

## 2014-11-15 ENCOUNTER — Telehealth: Payer: Self-pay | Admitting: Cardiology

## 2014-11-15 NOTE — Telephone Encounter (Signed)
Sleep Study scheduled for 2/1.

## 2014-11-15 NOTE — Telephone Encounter (Signed)
Left message to call back  

## 2014-11-15 NOTE — Telephone Encounter (Signed)
Spoke with patient about CT results and sleep study scheduled for February.

## 2014-11-15 NOTE — Telephone Encounter (Signed)
New msg  Pt returning call, please contact at [phone]

## 2014-11-16 ENCOUNTER — Ambulatory Visit: Payer: Medicare Other | Admitting: Physician Assistant

## 2014-11-20 ENCOUNTER — Other Ambulatory Visit (HOSPITAL_COMMUNITY): Payer: Self-pay | Admitting: Respiratory Therapy

## 2014-11-20 ENCOUNTER — Ambulatory Visit (HOSPITAL_COMMUNITY)
Admission: RE | Admit: 2014-11-20 | Discharge: 2014-11-20 | Disposition: A | Discharge status: Home / Self Care | Payer: Medicare Other | Source: Ambulatory Visit | Attending: Pulmonary Disease | Admitting: Pulmonary Disease

## 2014-11-20 DIAGNOSIS — R0609 Other forms of dyspnea: Secondary | ICD-10-CM | POA: Insufficient documentation

## 2014-11-20 DIAGNOSIS — R0600 Dyspnea, unspecified: Secondary | ICD-10-CM

## 2014-11-20 LAB — PULMONARY FUNCTION TEST
FEF 25-75 Post: 2.14 L/s
FEF 25-75 Pre: 1.42 L/s
FEF2575-%Change-Post: 50 %
FEF2575-%Pred-Post: 108 %
FEF2575-%Pred-Pre: 71 %
FEV1-%Change-Post: 8 %
FEV1-%Pred-Post: 104 %
FEV1-%Pred-Pre: 96 %
FEV1-Post: 2.41 L
FEV1-Pre: 2.23 L
FEV1FVC-%Change-Post: 1 %
FEV1FVC-%Pred-Pre: 95 %
FEV6-%Change-Post: 6 %
FEV6-%Pred-Post: 111 %
FEV6-%Pred-Pre: 104 %
FEV6-Post: 3.26 L
FEV6-Pre: 3.05 L
FEV6FVC-%Change-Post: 0 %
FEV6FVC-%Pred-Post: 102 %
FEV6FVC-%Pred-Pre: 102 %
FVC-%Change-Post: 6 %
FVC-%Pred-Post: 107 %
FVC-%Pred-Pre: 101 %
FVC-Post: 3.3 L
FVC-Pre: 3.09 L
Post FEV1/FVC ratio: 73 %
Post FEV6/FVC ratio: 99 %
Pre FEV1/FVC ratio: 72 %
Pre FEV6/FVC Ratio: 99 %

## 2014-11-20 MED ORDER — SODIUM CHLORIDE 0.9 % IN NEBU
3.0000 mL | INHALATION_SOLUTION | Freq: Once | RESPIRATORY_TRACT | Status: AC
  Administered 2014-11-20: 3 mL via RESPIRATORY_TRACT

## 2014-11-20 MED ORDER — METHACHOLINE 0.0625 MG/ML NEB SOLN
2.0000 mL | Freq: Once | RESPIRATORY_TRACT | Status: AC
  Administered 2014-11-20: 0.125 mg via RESPIRATORY_TRACT

## 2014-11-20 MED ORDER — METHACHOLINE 1 MG/ML NEB SOLN
2.0000 mL | Freq: Once | RESPIRATORY_TRACT | Status: AC
  Administered 2014-11-20: 2 mg via RESPIRATORY_TRACT

## 2014-11-20 MED ORDER — METHACHOLINE 4 MG/ML NEB SOLN
2.0000 mL | Freq: Once | RESPIRATORY_TRACT | Status: AC
  Administered 2014-11-20: 8 mg via RESPIRATORY_TRACT

## 2014-11-20 MED ORDER — METHACHOLINE 16 MG/ML NEB SOLN
2.0000 mL | Freq: Once | RESPIRATORY_TRACT | Status: AC
  Administered 2014-11-20: 32 mg via RESPIRATORY_TRACT

## 2014-11-20 MED ORDER — ALBUTEROL SULFATE (2.5 MG/3ML) 0.083% IN NEBU
2.5000 mg | INHALATION_SOLUTION | Freq: Once | RESPIRATORY_TRACT | Status: AC
  Administered 2014-11-20: 2.5 mg via RESPIRATORY_TRACT

## 2014-11-20 MED ORDER — METHACHOLINE 0.25 MG/ML NEB SOLN
2.0000 mL | Freq: Once | RESPIRATORY_TRACT | Status: AC
  Administered 2014-11-20: 0.5 mg via RESPIRATORY_TRACT

## 2014-11-23 ENCOUNTER — Telehealth: Payer: Self-pay | Admitting: Pulmonary Disease

## 2014-11-23 NOTE — Telephone Encounter (Signed)
[id number] x1 

## 2014-11-23 NOTE — Telephone Encounter (Signed)
Result Note     Please let pt know that her test did not show evidence for asthma. Dr. Radford Pax will continue to evaluate her pulmonary hypertension.    If her cough does not significantly improve, she is to let us know.

## 2014-11-23 NOTE — Telephone Encounter (Signed)
Pt is aware of her PFT results.

## 2014-12-03 ENCOUNTER — Other Ambulatory Visit: Payer: Self-pay | Admitting: Internal Medicine

## 2014-12-27 ENCOUNTER — Ambulatory Visit (INDEPENDENT_AMBULATORY_CARE_PROVIDER_SITE_OTHER): Payer: Medicare Other | Admitting: Family Medicine

## 2014-12-27 ENCOUNTER — Encounter: Payer: Self-pay | Admitting: Family Medicine

## 2014-12-27 VITALS — BP 122/64 | HR 70 | Temp 98.7°F | Ht 64.0 in | Wt 163.8 lb

## 2014-12-27 DIAGNOSIS — J0100 Acute maxillary sinusitis, unspecified: Secondary | ICD-10-CM | POA: Diagnosis not present

## 2014-12-27 DIAGNOSIS — L719 Rosacea, unspecified: Secondary | ICD-10-CM

## 2014-12-27 DIAGNOSIS — J019 Acute sinusitis, unspecified: Secondary | ICD-10-CM | POA: Insufficient documentation

## 2014-12-27 MED ORDER — METRONIDAZOLE 0.75 % EX CREA: TOPICAL_CREAM | Freq: Two times a day (BID) | CUTANEOUS | Status: DC

## 2014-12-27 MED ORDER — LEVOFLOXACIN 500 MG PO TABS: 500.0000 mg | ORAL_TABLET | Freq: Every day | ORAL | Status: DC

## 2014-12-27 NOTE — Progress Notes (Signed)
Pre visit review using our clinic review tool, if applicable. No additional management support is needed unless otherwise documented below in the visit note. 

## 2014-12-27 NOTE — Progress Notes (Signed)
Subjective:    Patient ID: Mary Gordon, female    DOB: [birth year], 69 y.o.   MRN: [id number]  HPI Here with uri symptoms   She is getting over a cold  Face is starting to hurt -under eyes  Thick green mucous from nose and chest Cough is bad   Rash also  Started on small areas of face 6 weeks ago - and it is moving around  Itches -mildly  Will blister up in some areas   Had low grade temp yesterday   Patient Active Problem List   Diagnosis Date Noted  . Pulmonary HTN 10/20/2014  . DOE (dyspnea on exertion) 09/26/2014  . PVCs (premature ventricular contractions) 09/26/2014  . Chest discomfort 08/18/2014  . Globus sensation 08/18/2014  . Left knee pain 08/18/2014  . Encounter for Medicare annual wellness exam 03/18/2014  . Skin tear of right lower leg without complication 42/35/3614  . Muscle pain 10/15/2012  . Fatigue 05/31/2012  . Fibromyalgia 05/31/2012  . Neck mass 05/31/2012  . Special screening for malignant neoplasms, colon 09/26/2011  . Hoarseness 07/25/2011  . GERD (gastroesophageal reflux disease) 07/08/2011  . Chest pain 06/18/2011  . CERVICAL RADICULOPATHY, RIGHT 09/04/2010  . SKIN RASH 04/22/2010  . PULMONARY NODULE 09/14/2009  . PALPITATIONS 08/17/2009  . UNSPECIFIED VITAMIN D DEFICIENCY 02/22/2009  . HYPERCHOLESTEROLEMIA 02/22/2009  . DEPRESSION 02/22/2009  . ALLERGIC RHINITIS 02/22/2009  . MENOPAUSAL SYNDROME 02/22/2009  . OSTEOPENIA 02/22/2009   Past Medical History  Diagnosis Date  . Palpitations   . High cholesterol   . Panic disorder   . Depression   . Osteopenia   . Vitamin D deficiency   . History of Clostridium difficile infection     In past from augmentin  . Pulmonary nodule     Being observed  . Allergy     all year  . Cervical cancer 1978  . Cataract   . Anxiety   . GERD (gastroesophageal reflux disease)   . Fibromyalgia   . Bursitis of hip   . Pulmonary HTN 10/20/2014    moderate with PASP 46mmHg   Past Surgical  History  Procedure Laterality Date  . Abdominal hysterectomy  1978  . Shoulder surgery  2008    Right shoulder bone spur  . Nasal hemorrhage control  2007  . 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 Colonoscopy  2002    Normal   History  Substance Use Topics  . Smoking status: Former Smoker -- 1.00 packs/day for 17 years    Types: Cigarettes    Quit date: 12/08/1978  . Smokeless tobacco: Never Used  . Alcohol Use: No   Family History  Problem Relation Age of Onset  . Heart attack Mother 46  . Emphysema Mother   . Allergies Mother   . Asthma Mother   . Allergies Father   . Arrhythmia Sister     Atrial fibrillation  . Cancer Sister     breast  . Diabetes Other     Parent  . Hyperlipidemia Other     Other relative  . Hypertension Other     Parent, other relative  . Glaucoma Father   . Breast cancer Other     Other relative, sister  . Allergies Other     Siblings  . Asthma Other     Sisters  . Emphysema Sister   . Heart disease Sister   . Rheumatologic disease Sister   . Lung  cancer Sister   . Colon cancer Neg Hx   . Esophageal cancer Neg Hx   . Rectal cancer Neg Hx   . Stomach cancer Neg Hx    Allergies  Allergen Reactions  . Amoxicillin-Pot Clavulanate Other (See Comments)    Cdiff  . Simvastatin     myalgias   Current Outpatient Prescriptions on File Prior to Visit  Medication Sig Dispense Refill  . Cetirizine HCl (ZYRTEC ALLERGY PO) Take 1 tablet by mouth daily.    Marland Kitchen estradiol (ESTRACE) 1 MG tablet TAKE 1 TABLET (1 MG TOTAL) BY MOUTH DAILY. 100 tablet 4  . metoprolol succinate (TOPROL-XL) 25 MG 24 hr tablet Take 0.5 tablets (12.5 mg total) by mouth daily. 15 tablet 3  . mometasone (NASONEX) 50 MCG/ACT nasal spray 2 sprays in each nostril once daily as needed. 17 g 11  . omeprazole (PRILOSEC) 40 MG capsule TAKE 1 CAPSULE BY MOUTH 2 TIMES DAILY. 60 capsule 3  . sertraline (ZOLOFT) 50 MG tablet TAKE 1 TABLET EVERY DAY 90 tablet 3  . traZODone  (DESYREL) 100 MG tablet TAKE 1 TABLET BY MOUTH AT BEDTIME. 90 tablet 3   No current facility-administered medications on file prior to visit.    Review of Systems Review of Systems  Constitutional: Negative for fever, appetite change,  and unexpected weight change.  ENT pos for cong and rhinorrhea and sinus pain  Eyes: Negative for pain and visual disturbance.  Respiratory: Negative for wheeze  and shortness of breath.   Cardiovascular: Negative for cp or palpitations    Gastrointestinal: Negative for nausea, diarrhea and constipation.  Genitourinary: Negative for urgency and frequency.  Skin: Negative for pallor and pos for red rash on face that does not itch  Neurological: Negative for weakness, light-headedness, numbness and headaches.  Hematological: Negative for adenopathy. Does not bruise/bleed easily.  Psychiatric/Behavioral: Negative for dysphoric mood. The patient is not nervous/anxious.         Objective:   Physical Exam  Constitutional: She appears well-developed and well-nourished. No distress.  HENT:  Head: Normocephalic and atraumatic.  Right Ear: External ear normal.  Left Ear: External ear normal.  Mouth/Throat: Oropharynx is clear and moist. No oropharyngeal exudate.  Nares are injected and congested  bilat frontal and maxillary sinus tenderness  Eyes: Conjunctivae and EOM are normal. Pupils are equal, round, and reactive to light. Right eye exhibits no discharge. Left eye exhibits no discharge.  Neck: Normal range of motion. Neck supple.  Cardiovascular: Normal rate and regular rhythm.   Pulmonary/Chest: Effort normal and breath sounds normal. No respiratory distress. She has no wheezes. She has no rales. She exhibits no tenderness.  Musculoskeletal: She exhibits no edema.  Lymphadenopathy:    She has no cervical adenopathy.  Neurological: She is alert. No cranial nerve deficit.  Skin: Skin is warm and dry. Rash noted. There is erythema.  Erythema on cheeks  and chin with some papules   Psychiatric: She has a normal mood and affect.          Assessment & Plan:   Problem List Items Addressed This Visit      Respiratory   Acute sinusitis - Primary    Cover with levaquin Disc symptomatic care - see instructions on AVS  Update if not starting to improve in a week or if worsening        Relevant Medications   chlorpheniramine (CHLOR-TRIMETON) 4 MG tablet   levofloxacin (LEVAQUIN) tablet     Musculoskeletal and Integument  Rosacea    New rash on face with erythema and papules -no new exposures Resembles rosacea  tx with metro cream  Disc avoidance of hot water/hot drinks/ alcohol/ sun and other triggers  Disc use of moisturizer and sunscreen

## 2014-12-27 NOTE — Patient Instructions (Signed)
Drink fluids and rest  Take levaquin as directed  Nasal saline spray if it helps  For rosacea try the metro cream and let me know if this does not help

## 2014-12-29 NOTE — Assessment & Plan Note (Signed)
Cover with levaquin Disc symptomatic care - see instructions on AVS  Update if not starting to improve in a week or if worsening

## 2014-12-29 NOTE — Assessment & Plan Note (Signed)
New rash on face with erythema and papules -no new exposures Resembles rosacea  tx with metro cream  Disc avoidance of hot water/hot drinks/ alcohol/ sun and other triggers  Disc use of moisturizer and sunscreen

## 2015-01-08 ENCOUNTER — Ambulatory Visit (HOSPITAL_BASED_OUTPATIENT_CLINIC_OR_DEPARTMENT_OTHER): Payer: Medicare Other | Attending: Cardiology | Admitting: Radiology

## 2015-01-08 VITALS — Ht 64.0 in | Wt 155.0 lb

## 2015-01-08 DIAGNOSIS — I270 Primary pulmonary hypertension: Secondary | ICD-10-CM | POA: Insufficient documentation

## 2015-01-08 DIAGNOSIS — G4719 Other hypersomnia: Secondary | ICD-10-CM | POA: Diagnosis present

## 2015-01-08 DIAGNOSIS — I2789 Other specified pulmonary heart diseases: Secondary | ICD-10-CM | POA: Diagnosis not present

## 2015-01-08 DIAGNOSIS — I272 Pulmonary hypertension, unspecified: Secondary | ICD-10-CM

## 2015-01-16 ENCOUNTER — Telehealth: Payer: Self-pay | Admitting: Cardiology

## 2015-01-16 NOTE — Telephone Encounter (Signed)
Please let patient know that she does not have any sleep apnea.  She had reduced REM sleep that is probably due to her Zoloft.

## 2015-01-16 NOTE — Sleep Study (Addendum)
   NAME: Mary Gordon DATE OF BIRTH:  [birth year] MEDICAL RECORD NUMBER [id number]  LOCATION: [facility]  PHYSICIAN: TURNER,TRACI R  DATE OF STUDY: 01/08/2015  SLEEP STUDY TYPE: Nocturnal Polysomnogram               REFERRING PHYSICIAN: Sueanne Margarita, MD  INDICATION FOR STUDY: excessive daytime sleepiness and pulmonary HTN  EPWORTH SLEEPINESS SCORE: 6 HEIGHT: 5\' 4"  (162.6 cm)  WEIGHT: 155 lb (70.308 kg)    Body mass index is 26.59 kg/(m^2).  NECK SIZE: 12.5 in.  MEDICATIONS: Reviewed in the chart  SLEEP ARCHITECTURE: The patient slept for a total of 212 minutes with no REM or slow wave sleep.  The onset to sleep latency was 23 minutes and sleep efficiency was reduced at 51%.    RESPIRATORY DATA: The patient had no apneas or hypopneas during sleep for an AHI of 0.  There was no snoring noted.    OXYGEN DATA: The lowest oxygent saturation noted during the study was 92%.  CARDIAC DATA: The patient maintained NSR with PVC's and PAC's during the study  MOVEMENT/PARASOMNIA: There were no periodic limb movement or REM behavior disorders during sleep.  IMPRESSION/ RECOMMENDATION:   1.  Reduced sleep efficiency with increased frequency of spontaneous arousals during sleep. 2.  Abnormal sleep architecture with no slow wave or REM sleep.  The patient takes an SSRI which is known to reduce REM sleep. 3.  No snoring noted. 4.  No evidence of sleep disordered breathing.  5.  Consider changing SSRI to another antidepressant that does not affect REM sleep  6.  The patient should be counseled in good sleep hygiene.   Signed: Sueanne Margarita Diplomate, American Board of Sleep Medicine  ELECTRONICALLY SIGNED ON:  01/16/2015, 5:47 AM [place] PH: [phone]   FX: [phone] [place]

## 2015-01-16 NOTE — Telephone Encounter (Signed)
Follow Up    Pt is following up on test results. Please call.

## 2015-01-16 NOTE — Telephone Encounter (Signed)
Left message to call back  

## 2015-01-16 NOTE — Telephone Encounter (Signed)
Pt made aware of results no questions at this time. Pt stated will talk in detail with Dr. Radford Pax at next appointment on 2/26th as to next steps in her treatment for Pulm. HTN.

## 2015-02-01 NOTE — Progress Notes (Signed)
Cardiology Office Note   Date:  02/02/2015   ID:  Mary Gordon, DOB Aug 27, 1946, MRN [id number]  PCP:  Loura Pardon, MD  Cardiologist:   Sueanne Margarita, MD   Chief Complaint  Patient presents with  . Shortness of Breath  . PVC's  . pulmonary HTN      History of Present Illness: This is a 69yo WF with a history of COPD, palpitations with PVC's, fibromyalgia and dyslipidemia who presents for followup of SOB, chest pain and PVC's.She has a history of moderate pulmonary HTN on 2D echo with PASP 56mmHg. Nuclear stress test showed no ischemia. Chest CT showed no abnormalities or PE. PFTs showed minimal perfusion defect and mildly reduced DLCO. No evidence of sleep apnea on sleep study.  She has a history of PVC's and heart monitor showed PVC's including bigeminal PVC's with PVC load 12.5% and she is on Toprol. She continues to have occasional chest pressure that is very random and occasional SOB.  The palpitations have resolved on Toprol.      Past Medical History  Diagnosis Date  . Palpitations   . High cholesterol   . Panic disorder   . Depression   . Osteopenia   . Vitamin D deficiency   . History of Clostridium difficile infection     In past from augmentin  . Pulmonary nodule     Being observed  . Allergy     all year  . Cervical cancer 1978  . Cataract   . Anxiety   . GERD (gastroesophageal reflux disease)   . Fibromyalgia   . Bursitis of hip   . Pulmonary HTN 10/20/2014    moderate with PASP 59mmHg    Past Surgical History  Procedure Laterality Date  . Abdominal hysterectomy  1978  . Shoulder surgery  2008    Right shoulder bone spur  . Nasal hemorrhage control  2007  . 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 Colonoscopy  2002    Normal     Current Outpatient Prescriptions  Medication Sig Dispense Refill  . Cetirizine HCl (ZYRTEC ALLERGY PO) Take 1 tablet by mouth daily.    . chlorpheniramine (CHLOR-TRIMETON) 4 MG tablet Take 8 mg by  mouth 2 (two) times daily as needed for allergies.    Marland Kitchen estradiol (ESTRACE) 1 MG tablet TAKE 1 TABLET (1 MG TOTAL) BY MOUTH DAILY. 100 tablet 4  . metoprolol succinate (TOPROL-XL) 25 MG 24 hr tablet Take 0.5 tablets (12.5 mg total) by mouth daily. 15 tablet 3  . metroNIDAZOLE (METROCREAM) 0.75 % cream Apply topically 2 (two) times daily. Apply to affected areas once to twice daily 45 g 1  . mometasone (NASONEX) 50 MCG/ACT nasal spray 2 sprays in each nostril once daily as needed. 17 g 11  . omeprazole (PRILOSEC) 40 MG capsule TAKE 1 CAPSULE BY MOUTH 2 TIMES DAILY. 60 capsule 3  . sertraline (ZOLOFT) 50 MG tablet TAKE 1 TABLET EVERY DAY 90 tablet 3  . traZODone (DESYREL) 100 MG tablet TAKE 1 TABLET BY MOUTH AT BEDTIME. 90 tablet 3   No current facility-administered medications for this visit.    Allergies:   Amoxicillin-pot clavulanate and Simvastatin    Social History:  The patient  reports that she quit smoking about 36 years ago. Her smoking use included Cigarettes. She has a 17 pack-year smoking history. She has never used smokeless tobacco. She reports that she does not drink alcohol or use illicit  drugs.   Family History:  The patient's family history includes Allergies in her father, mother, and other; Arrhythmia in her sister; Asthma in her mother and other; Breast cancer in her other; Cancer in her sister; Diabetes in her other; Emphysema in her mother and sister; Glaucoma in her father; Heart attack (age of onset: 89) in her mother; Heart disease in her sister; Hyperlipidemia in her other; Hypertension in her other; Lung cancer in her sister; Rheumatologic disease in her sister. There is no history of Colon cancer, Esophageal cancer, Rectal cancer, or Stomach cancer.    ROS:  Please see the history of present illness.   Otherwise, review of systems are positive for none.   All other systems are reviewed and negative.    PHYSICAL EXAM: VS:  BP 110/68 mmHg  Pulse 67  Ht 5\' 4"  (1.626  m)  Wt 162 lb (73.483 kg)  BMI 27.79 kg/m2  SpO2 96% , BMI Body mass index is 27.79 kg/(m^2). GEN: Well nourished, well developed, in no acute distress HEENT: normal Neck: no JVD, carotid bruits, or masses Cardiac: RRR; no murmurs, rubs, or gallops,no edema  Respiratory:  clear to auscultation bilaterally, normal work of breathing GI: soft, nontender, nondistended, + BS MS: no deformity or atrophy Skin: warm and dry, no rash Neuro:  Strength and sensation are intact Psych: euthymic mood, full affect   EKG:  EKG is not ordered today.    Recent Labs: 03/20/2014: Hemoglobin 12.6; Platelets 264.0 09/08/2014: TSH 2.530 09/26/2014: ALT 16; BUN 9; Creatinine 1.0; Potassium 3.9; Sodium 140    Lipid Panel    Component Value Date/Time   CHOL 239* 09/26/2014 1707   TRIG 124.0 09/26/2014 1707   HDL 67.00 09/26/2014 1707   CHOLHDL 4 09/26/2014 1707   VLDL 24.8 09/26/2014 1707   LDLCALC 147* 09/26/2014 1707   LDLDIRECT 161.5 10/24/2013 0947      Wt Readings from Last 3 Encounters:  02/02/15 162 lb (73.483 kg)  01/08/15 155 lb (70.308 kg)  12/27/14 163 lb 12.8 oz (74.299 kg)     ASSESSMENT AND PLAN:  1. SOB which is associated with cough that improved when she was given albuterol. There was some question of COPD in the past and PFTs showed possible COPD.  She was seen by Dr. Gwenette Greet who felt that she did not have COPD or asthma.    2. Atypical chest pain - Her nuclear stress test showed no ischemia and no coronary artery calcifications on chest CT. 3. PVC's that have increased in frequency recently with 12.5% PVC load on Holter. Now resolved on Toprol 4. Fibromyalgia 5. Panic Disorder  6. Moderate pulmonary HTN with PASP 38mmHg. No evidence of obstructive lung disease on PFTs. Her Chest CT showed no evidence of PE. No evidence of sleep apnea on sleep study.  I am going to refer her to Dr. Aundra Dubin for right heart cath to further evaluate pulmonary HTN    Current  medicines are reviewed at length with the patient today.  The patient does not have concerns regarding medicines.  The following changes have been made:  no change  Labs/ tests ordered today include: 2D echo     Disposition:   FU with me in 6 months   Signed, Sueanne Margarita, MD  02/02/2015 10:17 AM    [facility] [place], [place], [place]  [address] Phone: [phone]; Fax: [phone]

## 2015-02-02 ENCOUNTER — Encounter: Payer: Self-pay | Admitting: Cardiology

## 2015-02-02 ENCOUNTER — Ambulatory Visit (INDEPENDENT_AMBULATORY_CARE_PROVIDER_SITE_OTHER): Payer: Medicare Other | Admitting: Cardiology

## 2015-02-02 VITALS — BP 110/68 | HR 67 | Ht 64.0 in | Wt 162.0 lb

## 2015-02-02 DIAGNOSIS — R0609 Other forms of dyspnea: Secondary | ICD-10-CM | POA: Diagnosis not present

## 2015-02-02 DIAGNOSIS — I270 Primary pulmonary hypertension: Secondary | ICD-10-CM | POA: Diagnosis not present

## 2015-02-02 DIAGNOSIS — I493 Ventricular premature depolarization: Secondary | ICD-10-CM

## 2015-02-02 DIAGNOSIS — I272 Pulmonary hypertension, unspecified: Secondary | ICD-10-CM

## 2015-02-02 NOTE — Patient Instructions (Addendum)
You have been referred to Dr. Aundra Dubin for pulmonary hypertension in the [facility].  Your physician wants you to follow-up in: 6 months with Dr. Radford Pax. You will receive a reminder letter in the mail two months in advance. If you don't receive a letter, please call our office to schedule the follow-up appointment.

## 2015-02-19 ENCOUNTER — Other Ambulatory Visit: Payer: Self-pay | Admitting: Cardiology

## 2015-02-19 ENCOUNTER — Ambulatory Visit (HOSPITAL_COMMUNITY)
Admission: RE | Admit: 2015-02-19 | Discharge: 2015-02-19 | Disposition: A | Discharge status: Home / Self Care | Payer: Medicare Other | Source: Ambulatory Visit | Attending: Cardiology | Admitting: Cardiology

## 2015-02-19 ENCOUNTER — Encounter (HOSPITAL_COMMUNITY): Payer: Self-pay | Admitting: *Deleted

## 2015-02-19 ENCOUNTER — Encounter (HOSPITAL_COMMUNITY): Payer: Self-pay

## 2015-02-19 VITALS — BP 106/68 | HR 75 | Ht 64.0 in | Wt 162.4 lb

## 2015-02-19 DIAGNOSIS — I270 Primary pulmonary hypertension: Secondary | ICD-10-CM | POA: Diagnosis not present

## 2015-02-19 DIAGNOSIS — R079 Chest pain, unspecified: Secondary | ICD-10-CM | POA: Diagnosis not present

## 2015-02-19 DIAGNOSIS — I493 Ventricular premature depolarization: Secondary | ICD-10-CM | POA: Insufficient documentation

## 2015-02-19 DIAGNOSIS — I272 Pulmonary hypertension, unspecified: Secondary | ICD-10-CM

## 2015-02-19 LAB — BRAIN NATRIURETIC PEPTIDE: B NATRIURETIC PEPTIDE 5: 24.7 pg/mL (ref 0.0–100.0)

## 2015-02-19 MED ORDER — PANTOPRAZOLE SODIUM 40 MG PO TBEC: 40.0000 mg | DELAYED_RELEASE_TABLET | Freq: Every day | ORAL | Status: DC

## 2015-02-19 NOTE — Progress Notes (Signed)
Patient ID: Mary Gordon, female   DOB: 03/20/1946, 69 y.o.   MRN: [id number] PCP: Dr. Glori Bickers  69 yo with history of hyperlipidemia, chest pain, and palpitations presents for evaluation of pulmonary hypertension.  She was seen by Dr. Radford Pax recently for episodes of chest pressure.  The chest pressure is central, substernal.  It has no particular trigger, has occurred with exertion and at rest.  It happens almost daily and has been present for 6+ months.  It can last for a few seconds to a few minutes.  Generally, she has good exercise tolerance typically, most of the time without chest pressure.  She plays pickleball twice a week.  She gets short of breath mildly with a flight of steps.  No lightheadedness or syncope.  She has also had issues with palpitations.    Initially, she had ETT-Cardiolite done in 11/15 for the chest pressure.  This showed no ischemia or infarction.  Echo done in 11/15 also showed normal EF but PA systolic pressure was elevated at 49 mmHg.  The RV appeared normal.  She then had PFTs done in 12/15 that were normal.  She saw Dr. Gwenette Greet and is not thought to have COPD or asthma.  She had a CTA chest in 11/15 showing no acute findings, no PE.  Sleep study showed no OSA.  Given the palpitations, she had holter in 10/15, showing frequent PVCs (12.5% total beats).  She was started on Toprol XL with almost complete resolution of palpitations.  She has chronic muscle pain with focal points that is thought to be due to fibromyalgia, and she has had a facial rash thought to be rosacea.    Allergies:  1)  ! Augmentin  Past Medical History: 1. Palpitations: 48 hour holter in 7/12 with occasional PACs. Holter (10/15) with frequent PVCs, 12.5% of today.  2. Hyperlipidemia 3. Panic Disorder 4. Depression 5. Cervical cancer 6. osteopenia  7. vit D def  8. C difficile in past from Augmentin 9. Chest pain: ETT-myoview (7/12) with EF 79%, no ischemia or infarction. ETT-Cardiolite (11/15)  with 6 min exercise, not gated, no ischemia or infarction.  10. Pulmonary hypertension: Echo (11/15) with EF 60-65%, mild MR, normal RV size and systolic function, PA systolic pressure 49 mmHg.  CTA chest (11/15) with no evidence for PE.  Sleep study (12/15) with no OSA.  PFTs (12/15) with normal spirometry.  11. Rosacea 12. Fibromyalgia 13. GERD  Family History: Mother with MI at 68, sister with atrial fibrillation (had ablation)  Social History: Retired- from [employer]  married  Former Smoker- quit in her 51s Alcohol use-no Drug use-no Regular exercise-no G4P1 [employer]  Review of Systems        All systems reviewed and negative except as per HPI.   Current Outpatient Prescriptions  Medication Sig Dispense Refill  . Cetirizine HCl (ZYRTEC ALLERGY PO) Take 1 tablet by mouth daily.    . chlorpheniramine (CHLOR-TRIMETON) 4 MG tablet Take 8 mg by mouth 2 (two) times daily as needed for allergies.    Marland Kitchen estradiol (ESTRACE) 1 MG tablet TAKE 1 TABLET (1 MG TOTAL) BY MOUTH DAILY. 100 tablet 4  . metroNIDAZOLE (METROCREAM) 0.75 % cream Apply topically 2 (two) times daily. Apply to affected areas once to twice daily 45 g 1  . mometasone (NASONEX) 50 MCG/ACT nasal spray 2 sprays in each nostril once daily as needed. 17 g 11  . traZODone (DESYREL) 100 MG tablet TAKE 1 TABLET BY MOUTH AT BEDTIME.  90 tablet 3  . metoprolol succinate (TOPROL-XL) 25 MG 24 hr tablet TAKE 1/2 TABLET BY MOUTH DAILY 15 tablet 3  . pantoprazole (PROTONIX) 40 MG tablet Take 1 tablet (40 mg total) by mouth daily. 30 tablet 6  . sertraline (ZOLOFT) 50 MG tablet TAKE 1 TABLET EVERY DAY (Patient not taking: Reported on 02/19/2015) 90 tablet 3   No current facility-administered medications for this encounter.    BP 106/68 mmHg  Pulse 75  Ht 5\' 4"  (1.626 m)  Wt 162 lb 6.4 oz (73.664 kg)  BMI 27.86 kg/m2  SpO2 97% General: NAD Neck: No JVD, no thyromegaly or thyroid nodule.  Lungs: Clear to auscultation  bilaterally with normal respiratory effort. CV: Nondisplaced PMI.  Heart regular S1/S2, no S3/S4, no murmur.  No peripheral edema.  No carotid bruit.  Normal pedal pulses.  Abdomen: Soft, nontender, no hepatosplenomegaly, no distention.  Neurologic: Alert and oriented x 3.  Psych: Normal affect. Extremities: No clubbing or cyanosis.   Assessment/Plan: 1. PVCs: Frequent PVCs on 10/15 holter.  Echo with normal LV and RV systolic function.  Symptoms much improved on Toprol XL.  Suspect PVC frequency is down.  Would repeat holter in future to make sure PVC percentage down as she was starting to get towards the range where PVCs can trigger cardiomyopathy.  2. Chest pain: Suspect not due to CAD with negative Cardiolite in 11/15.   3. Pulmonary hypertension: PA systolic pressure 49 mmHg on echo in 11/15.  Normal LV and RV size/systolic function.  No significant dyspnea but does get chest pressure as above.  This does not have a definite pattern.  PFTs were normal, no evidence for COPD.  CTA chest showed no acute PE.  Sleep study showed no OSA.   - I do think that it would be reasonable to assess for PAH versus pulmonary venous hypertension versus false positive echo with RHC.  Will arrange this for April (she is helping someone move and wants to wait until then to do the study). - I will send rheumatological workup today: No definite rheumatological diagnosis other than fibromyalgia.  Had facial rash but thought to be rosacea.  Will send RF, ANA, anti-centromere antibody, anti-RNP antibody.  - Followup after RHC.   Loralie Champagne 02/19/2015

## 2015-02-19 NOTE — Patient Instructions (Signed)
Stop omeprazole.  Start Protonix 40mg  1 tablet daily.  Right Heart Cath 03/14/2015 (see instructions sheet)  Follow up in 6 weeks.

## 2015-02-20 ENCOUNTER — Other Ambulatory Visit: Payer: Self-pay | Admitting: *Deleted

## 2015-02-20 LAB — RHEUMATOID FACTOR: Rhuematoid fact SerPl-aCnc: 10 IU/mL (ref 0.0–13.9)

## 2015-02-22 ENCOUNTER — Telehealth (HOSPITAL_COMMUNITY): Payer: Self-pay | Admitting: Cardiology

## 2015-02-22 NOTE — Telephone Encounter (Signed)
Pt scheduled for a RHC with [place] code [phone] With pts current insurance- Medicare/[employer]- [employer]

## 2015-03-14 ENCOUNTER — Ambulatory Visit (HOSPITAL_COMMUNITY)
Admission: RE | Admit: 2015-03-14 | Discharge: 2015-03-14 | Disposition: A | Discharge status: Home / Self Care | Payer: Medicare Other | Source: Ambulatory Visit | Attending: Cardiology | Admitting: Cardiology

## 2015-03-15 ENCOUNTER — Telehealth (HOSPITAL_COMMUNITY): Payer: Self-pay

## 2015-03-15 NOTE — Telephone Encounter (Signed)
Patient called c/o having a "head cold".  Scheduled for heart cath tomorrow morning.  Is not running a fever, only complaint is mild nasal/head congestion.  Is taking robitussin cough and a mild decongestant.  Advised ok to take before procedure that morning per Dr. Aundra Dubin.  Patient states she will try to take a smaller dose than usual just in case.    Mary Gordon

## 2015-03-16 ENCOUNTER — Encounter (HOSPITAL_COMMUNITY): Payer: Self-pay | Admitting: Cardiology

## 2015-03-16 ENCOUNTER — Ambulatory Visit (HOSPITAL_COMMUNITY)
Admission: RE | Admit: 2015-03-16 | Discharge: 2015-03-16 | Disposition: A | Discharge status: Home / Self Care | Payer: Medicare Other | Source: Ambulatory Visit | Attending: Cardiology | Admitting: Cardiology

## 2015-03-16 ENCOUNTER — Encounter (HOSPITAL_COMMUNITY)
Admission: RE | Disposition: A | Discharge status: Home / Self Care | Payer: Self-pay | Source: Ambulatory Visit | Attending: Cardiology

## 2015-03-16 DIAGNOSIS — M797 Fibromyalgia: Secondary | ICD-10-CM | POA: Diagnosis not present

## 2015-03-16 DIAGNOSIS — F329 Major depressive disorder, single episode, unspecified: Secondary | ICD-10-CM | POA: Diagnosis not present

## 2015-03-16 DIAGNOSIS — I340 Nonrheumatic mitral (valve) insufficiency: Secondary | ICD-10-CM | POA: Diagnosis not present

## 2015-03-16 DIAGNOSIS — Z87891 Personal history of nicotine dependence: Secondary | ICD-10-CM | POA: Insufficient documentation

## 2015-03-16 DIAGNOSIS — R931 Abnormal findings on diagnostic imaging of heart and coronary circulation: Secondary | ICD-10-CM | POA: Insufficient documentation

## 2015-03-16 DIAGNOSIS — Z8541 Personal history of malignant neoplasm of cervix uteri: Secondary | ICD-10-CM | POA: Insufficient documentation

## 2015-03-16 DIAGNOSIS — I493 Ventricular premature depolarization: Secondary | ICD-10-CM | POA: Diagnosis not present

## 2015-03-16 DIAGNOSIS — I270 Primary pulmonary hypertension: Secondary | ICD-10-CM | POA: Diagnosis not present

## 2015-03-16 DIAGNOSIS — M8580 Other specified disorders of bone density and structure, unspecified site: Secondary | ICD-10-CM | POA: Diagnosis not present

## 2015-03-16 DIAGNOSIS — E785 Hyperlipidemia, unspecified: Secondary | ICD-10-CM | POA: Insufficient documentation

## 2015-03-16 DIAGNOSIS — L719 Rosacea, unspecified: Secondary | ICD-10-CM | POA: Diagnosis not present

## 2015-03-16 DIAGNOSIS — F410 Panic disorder [episodic paroxysmal anxiety] without agoraphobia: Secondary | ICD-10-CM | POA: Diagnosis not present

## 2015-03-16 DIAGNOSIS — E559 Vitamin D deficiency, unspecified: Secondary | ICD-10-CM | POA: Diagnosis not present

## 2015-03-16 DIAGNOSIS — I272 Other secondary pulmonary hypertension: Secondary | ICD-10-CM | POA: Diagnosis present

## 2015-03-16 DIAGNOSIS — K219 Gastro-esophageal reflux disease without esophagitis: Secondary | ICD-10-CM | POA: Diagnosis not present

## 2015-03-16 DIAGNOSIS — Z79899 Other long term (current) drug therapy: Secondary | ICD-10-CM | POA: Diagnosis not present

## 2015-03-16 HISTORY — PX: RIGHT HEART CATHETERIZATION: SHX5447

## 2015-03-16 LAB — CBC
HEMATOCRIT: 41.2 % (ref 36.0–46.0)
HEMOGLOBIN: 13.4 g/dL (ref 12.0–15.0)
MCH: 28.1 pg (ref 26.0–34.0)
MCHC: 32.5 g/dL (ref 30.0–36.0)
MCV: 86.4 fL (ref 78.0–100.0)
Platelets: 251 10*3/uL (ref 150–400)
RBC: 4.77 MIL/uL (ref 3.87–5.11)
RDW: 13.2 % (ref 11.5–15.5)
WBC: 6.4 10*3/uL (ref 4.0–10.5)

## 2015-03-16 LAB — BASIC METABOLIC PANEL
ANION GAP: 4 — AB (ref 5–15)
BUN: 8 mg/dL (ref 6–23)
CALCIUM: 9.2 mg/dL (ref 8.4–10.5)
CO2: 31 mmol/L (ref 19–32)
Chloride: 104 mmol/L (ref 96–112)
Creatinine, Ser: 0.9 mg/dL (ref 0.50–1.10)
GFR calc non Af Amer: 64 mL/min — ABNORMAL LOW (ref >90)
GFR, EST AFRICAN AMERICAN: 74 mL/min — AB (ref >90)
Glucose, Bld: 117 mg/dL — ABNORMAL HIGH (ref 70–99)
Potassium: 4.4 mmol/L (ref 3.5–5.1)
Sodium: 139 mmol/L (ref 135–145)

## 2015-03-16 LAB — PROTIME-INR
INR: 0.95 (ref 0.00–1.49)
Prothrombin Time: 12.8 seconds (ref 11.6–15.2)

## 2015-03-16 SURGERY — RIGHT HEART CATH

## 2015-03-16 MED ORDER — SODIUM CHLORIDE 0.9 % IJ SOLN: 3.0000 mL | INTRAMUSCULAR | Status: DC | PRN

## 2015-03-16 MED ORDER — SODIUM CHLORIDE 0.9 % IV SOLN: INTRAVENOUS | Status: AC

## 2015-03-16 MED ORDER — SODIUM CHLORIDE 0.9 % IV SOLN: 250.0000 mL | INTRAVENOUS | Status: DC | PRN

## 2015-03-16 MED ORDER — LIDOCAINE HCL (PF) 1 % IJ SOLN
INTRAMUSCULAR | Status: AC
  Filled 2015-03-16: qty 30

## 2015-03-16 MED ORDER — MIDAZOLAM HCL 2 MG/2ML IJ SOLN
INTRAMUSCULAR | Status: AC
  Filled 2015-03-16: qty 2

## 2015-03-16 MED ORDER — HEPARIN (PORCINE) IN NACL 2-0.9 UNIT/ML-% IJ SOLN
INTRAMUSCULAR | Status: AC
  Filled 2015-03-16: qty 1000

## 2015-03-16 MED ORDER — SODIUM CHLORIDE 0.9 % IJ SOLN: 3.0000 mL | Freq: Two times a day (BID) | INTRAMUSCULAR | Status: DC

## 2015-03-16 MED ORDER — ASPIRIN 81 MG PO CHEW
81.0000 mg | CHEWABLE_TABLET | ORAL | Status: AC
  Administered 2015-03-16: 81 mg via ORAL

## 2015-03-16 MED ORDER — SODIUM CHLORIDE 0.9 % IV SOLN
INTRAVENOUS | Status: DC
Start: 2015-03-17 — End: 2015-03-16

## 2015-03-16 MED ORDER — ACETAMINOPHEN 325 MG PO TABS: 650.0000 mg | ORAL_TABLET | ORAL | Status: DC | PRN

## 2015-03-16 MED ORDER — ONDANSETRON HCL 4 MG/2ML IJ SOLN: 4.0000 mg | Freq: Four times a day (QID) | INTRAMUSCULAR | Status: DC | PRN

## 2015-03-16 MED ORDER — ASPIRIN 81 MG PO CHEW
CHEWABLE_TABLET | ORAL | Status: AC
  Filled 2015-03-16: qty 1

## 2015-03-16 MED ORDER — FENTANYL CITRATE 0.05 MG/ML IJ SOLN
INTRAMUSCULAR | Status: AC
  Filled 2015-03-16: qty 2

## 2015-03-16 NOTE — H&P (View-Only) (Signed)
Patient ID: Mary Gordon, female   DOB: 1946/04/29, 69 y.o.   MRN: [id number] PCP: Dr. Glori Bickers  69 yo with history of hyperlipidemia, chest pain, and palpitations presents for evaluation of pulmonary hypertension.  She was seen by Dr. Radford Pax recently for episodes of chest pressure.  The chest pressure is central, substernal.  It has no particular trigger, has occurred with exertion and at rest.  It happens almost daily and has been present for 6+ months.  It can last for a few seconds to a few minutes.  Generally, she has good exercise tolerance typically, most of the time without chest pressure.  She plays pickleball twice a week.  She gets short of breath mildly with a flight of steps.  No lightheadedness or syncope.  She has also had issues with palpitations.    Initially, she had ETT-Cardiolite done in 11/15 for the chest pressure.  This showed no ischemia or infarction.  Echo done in 11/15 also showed normal EF but PA systolic pressure was elevated at 49 mmHg.  The RV appeared normal.  She then had PFTs done in 12/15 that were normal.  She saw Dr. Gwenette Greet and is not thought to have COPD or asthma.  She had a CTA chest in 11/15 showing no acute findings, no PE.  Sleep study showed no OSA.  Given the palpitations, she had holter in 10/15, showing frequent PVCs (12.5% total beats).  She was started on Toprol XL with almost complete resolution of palpitations.  She has chronic muscle pain with focal points that is thought to be due to fibromyalgia, and she has had a facial rash thought to be rosacea.    Allergies:  1)  ! Augmentin  Past Medical History: 1. Palpitations: 48 hour holter in 7/12 with occasional PACs. Holter (10/15) with frequent PVCs, 12.5% of today.  2. Hyperlipidemia 3. Panic Disorder 4. Depression 5. Cervical cancer 6. osteopenia  7. vit D def  8. C difficile in past from Augmentin 9. Chest pain: ETT-myoview (7/12) with EF 79%, no ischemia or infarction. ETT-Cardiolite (11/15)  with 6 min exercise, not gated, no ischemia or infarction.  10. Pulmonary hypertension: Echo (11/15) with EF 60-65%, mild MR, normal RV size and systolic function, PA systolic pressure 49 mmHg.  CTA chest (11/15) with no evidence for PE.  Sleep study (12/15) with no OSA.  PFTs (12/15) with normal spirometry.  11. Rosacea 12. Fibromyalgia 13. GERD  Family History: Mother with MI at 65, sister with atrial fibrillation (had ablation)  Social History: Retired- from [employer]  married  Former Smoker- quit in her 64s Alcohol use-no Drug use-no Regular exercise-no G4P1 Jehovah's Witness  Review of Systems        All systems reviewed and negative except as per HPI.   Current Outpatient Prescriptions  Medication Sig Dispense Refill  . Cetirizine HCl (ZYRTEC ALLERGY PO) Take 1 tablet by mouth daily.    . chlorpheniramine (CHLOR-TRIMETON) 4 MG tablet Take 8 mg by mouth 2 (two) times daily as needed for allergies.    Marland Kitchen estradiol (ESTRACE) 1 MG tablet TAKE 1 TABLET (1 MG TOTAL) BY MOUTH DAILY. 100 tablet 4  . metroNIDAZOLE (METROCREAM) 0.75 % cream Apply topically 2 (two) times daily. Apply to affected areas once to twice daily 45 g 1  . mometasone (NASONEX) 50 MCG/ACT nasal spray 2 sprays in each nostril once daily as needed. 17 g 11  . traZODone (DESYREL) 100 MG tablet TAKE 1 TABLET BY MOUTH AT BEDTIME.  90 tablet 3  . metoprolol succinate (TOPROL-XL) 25 MG 24 hr tablet TAKE 1/2 TABLET BY MOUTH DAILY 15 tablet 3  . pantoprazole (PROTONIX) 40 MG tablet Take 1 tablet (40 mg total) by mouth daily. 30 tablet 6  . sertraline (ZOLOFT) 50 MG tablet TAKE 1 TABLET EVERY DAY (Patient not taking: Reported on 02/19/2015) 90 tablet 3   No current facility-administered medications for this encounter.    BP 106/68 mmHg  Pulse 75  Ht 5\' 4"  (1.626 m)  Wt 162 lb 6.4 oz (73.664 kg)  BMI 27.86 kg/m2  SpO2 97% General: NAD Neck: No JVD, no thyromegaly or thyroid nodule.  Lungs: Clear to auscultation  bilaterally with normal respiratory effort. CV: Nondisplaced PMI.  Heart regular S1/S2, no S3/S4, no murmur.  No peripheral edema.  No carotid bruit.  Normal pedal pulses.  Abdomen: Soft, nontender, no hepatosplenomegaly, no distention.  Neurologic: Alert and oriented x 3.  Psych: Normal affect. Extremities: No clubbing or cyanosis.   Assessment/Plan: 1. PVCs: Frequent PVCs on 10/15 holter.  Echo with normal LV and RV systolic function.  Symptoms much improved on Toprol XL.  Suspect PVC frequency is down.  Would repeat holter in future to make sure PVC percentage down as she was starting to get towards the range where PVCs can trigger cardiomyopathy.  2. Chest pain: Suspect not due to CAD with negative Cardiolite in 11/15.   3. Pulmonary hypertension: PA systolic pressure 49 mmHg on echo in 11/15.  Normal LV and RV size/systolic function.  No significant dyspnea but does get chest pressure as above.  This does not have a definite pattern.  PFTs were normal, no evidence for COPD.  CTA chest showed no acute PE.  Sleep study showed no OSA.   - I do think that it would be reasonable to assess for PAH versus pulmonary venous hypertension versus false positive echo with RHC.  Will arrange this for April (she is helping someone move and wants to wait until then to do the study). - I will send rheumatological workup today: No definite rheumatological diagnosis other than fibromyalgia.  Had facial rash but thought to be rosacea.  Will send RF, ANA, anti-centromere antibody, anti-RNP antibody.  - Followup after RHC.   Loralie Champagne 02/19/2015

## 2015-03-16 NOTE — CV Procedure (Signed)
    Cardiac Catheterization Procedure Note  Name: Mary Gordon MRN: [id number] DOB: 1946-11-02  Procedure: Right Heart Cath  Indication: Assess for pulmonary hypertension.    Procedural Details: The right groin was prepped, draped, and anesthetized with 1% lidocaine. Using the modified Seldinger technique a 7 French sheath was placed in the right femoral vein. A Swan-Ganz catheter was used for the right heart catheterization. Standard protocol was followed for recording of right heart pressures and sampling of oxygen saturations. Fick and thermodilution cardiac output was calculated. There were no immediate procedural complications. The patient was transferred to the post catheterization recovery area for further monitoring.  Procedural Findings: Hemodynamics (mmHg) RA mean 2 RV 22/1 PA 21/3, mean 11 PCWP mean 2  Oxygen saturations: PA 69% AO 95%  Cardiac Output (Fick) 4.86  Cardiac Index (Fick) 2.81  Cardiac Output (Thermo) 4.6 Cardiac Index (Thermo) 2.7   Final Conclusions:  No pulmonary hypertension.  Very low filling pressures (re-zeroed and position confirmed).  False positive echo.  Will get IV fluid before discharge.   Loralie Champagne 03/16/2015, 8:52 AM

## 2015-03-16 NOTE — Discharge Instructions (Signed)
Venogram, Care After °Refer to this sheet in the next few weeks. These instructions provide you with information on caring for yourself after your procedure. Your health care provider may also give you more specific instructions. Your treatment has been planned according to current medical practices, but problems sometimes occur. Call your health care provider if you have any problems or questions after your procedure. °WHAT TO EXPECT AFTER THE PROCEDURE °After your procedure, it is typical to have the following sensations: °· Mild discomfort at the catheter insertion site. °HOME CARE INSTRUCTIONS  °· Take all medicines exactly as directed. °· Follow any prescribed diet. °· Follow instructions regarding both rest and physical activity. °· Drink more fluids for the first several days after the procedure in order to help flush dye from your kidneys. °SEEK MEDICAL CARE IF: °· You develop a rash. °· You have fever not controlled by medicine. °SEEK IMMEDIATE MEDICAL CARE IF: °· There is pain, drainage, bleeding, redness, swelling, warmth or a red streak at the site of the IV tube. °· The extremity where your IV tube was placed becomes discolored, numb, or cool. °· You have difficulty breathing or shortness of breath. °· You develop chest pain. °· You have excessive dizziness or fainting. °Document Released: 09/14/2013 Document Revised: 11/29/2013 Document Reviewed: 09/14/2013 °ExitCare® Patient Information ©2015 [employer]. This information is not intended to replace advice given to you by your health care provider. Make sure you discuss any questions you have with your health care provider. ° °

## 2015-03-16 NOTE — Interval H&P Note (Signed)
History and Physical Interval Note:  03/16/2015 8:03 AM  Mary Gordon  has presented today for surgery, with the diagnosis of hp  The various methods of treatment have been discussed with the patient and family. After consideration of risks, benefits and other options for treatment, the patient has consented to  Procedure(s): RIGHT HEART CATH (N/A) as a surgical intervention .  The patient's history has been reviewed, patient examined, no change in status, stable for surgery.  I have reviewed the patient's chart and labs.  Questions were answered to the patient's satisfaction.     Mj Willis [employer]

## 2015-03-19 LAB — POCT I-STAT 3, VENOUS BLOOD GAS (G3P V)
Acid-base deficit: 2 mmol/L (ref 0.0–2.0)
Bicarbonate: 24.7 mEq/L — ABNORMAL HIGH (ref 20.0–24.0)
O2 Saturation: 69 %
PCO2 VEN: 47.7 mmHg (ref 45.0–50.0)
TCO2: 26 mmol/L (ref 0–100)
pH, Ven: 7.322 — ABNORMAL HIGH (ref 7.250–7.300)
pO2, Ven: 39 mmHg (ref 30.0–45.0)

## 2015-04-02 ENCOUNTER — Ambulatory Visit (HOSPITAL_COMMUNITY)
Admission: RE | Admit: 2015-04-02 | Discharge: 2015-04-02 | Disposition: A | Discharge status: Home / Self Care | Payer: Medicare Other | Source: Ambulatory Visit | Attending: Internal Medicine | Admitting: Internal Medicine

## 2015-04-02 VITALS — BP 112/68 | HR 75 | Wt 163.0 lb

## 2015-04-02 DIAGNOSIS — Z7982 Long term (current) use of aspirin: Secondary | ICD-10-CM | POA: Diagnosis not present

## 2015-04-02 DIAGNOSIS — Z87891 Personal history of nicotine dependence: Secondary | ICD-10-CM | POA: Insufficient documentation

## 2015-04-02 DIAGNOSIS — I272 Other secondary pulmonary hypertension: Secondary | ICD-10-CM | POA: Insufficient documentation

## 2015-04-02 DIAGNOSIS — R002 Palpitations: Secondary | ICD-10-CM | POA: Diagnosis not present

## 2015-04-02 DIAGNOSIS — K219 Gastro-esophageal reflux disease without esophagitis: Secondary | ICD-10-CM | POA: Insufficient documentation

## 2015-04-02 DIAGNOSIS — M797 Fibromyalgia: Secondary | ICD-10-CM | POA: Diagnosis not present

## 2015-04-02 DIAGNOSIS — I209 Angina pectoris, unspecified: Secondary | ICD-10-CM

## 2015-04-02 DIAGNOSIS — Z8249 Family history of ischemic heart disease and other diseases of the circulatory system: Secondary | ICD-10-CM | POA: Diagnosis not present

## 2015-04-02 DIAGNOSIS — I493 Ventricular premature depolarization: Secondary | ICD-10-CM | POA: Insufficient documentation

## 2015-04-02 DIAGNOSIS — R079 Chest pain, unspecified: Secondary | ICD-10-CM | POA: Diagnosis not present

## 2015-04-02 DIAGNOSIS — L719 Rosacea, unspecified: Secondary | ICD-10-CM | POA: Insufficient documentation

## 2015-04-02 DIAGNOSIS — E785 Hyperlipidemia, unspecified: Secondary | ICD-10-CM | POA: Diagnosis not present

## 2015-04-02 DIAGNOSIS — I270 Primary pulmonary hypertension: Secondary | ICD-10-CM | POA: Diagnosis not present

## 2015-04-02 DIAGNOSIS — Z79899 Other long term (current) drug therapy: Secondary | ICD-10-CM | POA: Diagnosis not present

## 2015-04-02 MED ORDER — ASPIRIN EC 81 MG PO TBEC: 81.0000 mg | DELAYED_RELEASE_TABLET | Freq: Every day | ORAL | Status: DC

## 2015-04-02 MED ORDER — ISOSORBIDE MONONITRATE ER 30 MG PO TB24: 30.0000 mg | ORAL_TABLET | Freq: Every day | ORAL | Status: DC

## 2015-04-02 NOTE — Progress Notes (Signed)
Patient ID: Mary Gordon, female   DOB: Feb 11, 1946, 69 y.o.   MRN: [id number] PCP: Dr. Glori Bickers  69 yo with history of hyperlipidemia, chest pain, and palpitations presented initially for evaluation of pulmonary hypertension.  She was seen by Dr. Radford Pax for episodes of chest pressure.  The chest pressure is central, substernal.  It happens almost daily and has been present for 6+ months.  It can last for a few seconds to a few minutes.  She gets the chest pressure with exertion such as walking up steps or walking fast. She sometimes also has chest pain at rest.  She gets short of breath mildly with a flight of steps.  No lightheadedness or syncope.  She has also had issues with palpitations.    Initially, she had ETT-Cardiolite done in 11/15 for the chest pressure.  This showed no ischemia or infarction.  Echo done in 11/15 also showed normal EF but PA systolic pressure was elevated at 49 mmHg.  The RV appeared normal.  She then had PFTs done in 12/15 that were normal.  She saw Dr. Gwenette Greet and is not thought to have COPD or asthma.  She had a CTA chest in 11/15 showing no acute findings, no PE.  Sleep study showed no OSA.  Given the palpitations, she had holter in 10/15, showing frequent PVCs (12.5% total beats).  She was started on Toprol XL with almost complete resolution of palpitations.  She has chronic muscle pain with focal points that is thought to be due to fibromyalgia.  I did a right heart cath in 4/16 showing no pulmonary hypertension and low filling pressures.    Labs (10/15): LDL 147, HDL 67 Labs (3/16): BNP 25, RF normal Labs (4/16): K 4.4, creatinine 0.9  Allergies:  1)  ! Augmentin  Past Medical History: 1. Palpitations: 48 hour holter in 7/12 with occasional PACs. Holter (10/15) with frequent PVCs, 12.5% of today.  2. Hyperlipidemia: Myalgias with simvastatin and another statin that she does not remember 3. Panic Disorder 4. Depression 5. Cervical cancer 6. osteopenia  7. vit D  def  8. C difficile in past from Augmentin 9. Chest pain: ETT-myoview (7/12) with EF 79%, no ischemia or infarction. ETT-Cardiolite (11/15) with 6 min exercise, not gated, no ischemia or infarction.  10. Pulmonary hypertension: Echo (11/15) with EF 60-65%, mild MR, normal RV size and systolic function, PA systolic pressure 49 mmHg.  CTA chest (11/15) with no evidence for PE.  Sleep study (12/15) with no OSA.  PFTs (12/15) with normal spirometry.  RHC (4/16) with mean RA 2, PA 21/3 mean 11, PCWP mean 2, CI 2.81 Fick 2.7 thermo. Possible false negative PA pressure elevation by echo.  11. Rosacea 12. Fibromyalgia 13. GERD  Family History: Mother with MI at 49, sister with atrial fibrillation (had ablation)  Social History: Retired- from [employer]  married  Former Smoker- quit in her 31s Alcohol use-no Drug use-no Regular exercise-no G4P1 Jehovah's Witness  Review of Systems        All systems reviewed and negative except as per HPI.   Current Outpatient Prescriptions  Medication Sig Dispense Refill  . Cetirizine HCl (ZYRTEC ALLERGY PO) Take 1 tablet by mouth daily.    . chlorpheniramine (CHLOR-TRIMETON) 4 MG tablet Take 8 mg by mouth 2 (two) times daily as needed for allergies.    Marland Kitchen estradiol (ESTRACE) 1 MG tablet TAKE 1 TABLET (1 MG TOTAL) BY MOUTH DAILY. 100 tablet 4  . metoprolol succinate (TOPROL-XL) 25  MG 24 hr tablet TAKE 1/2 TABLET BY MOUTH DAILY 15 tablet 3  . pantoprazole (PROTONIX) 40 MG tablet Take 1 tablet (40 mg total) by mouth daily. 30 tablet 6  . traZODone (DESYREL) 100 MG tablet TAKE 1 TABLET BY MOUTH AT BEDTIME. 90 tablet 3  . aspirin EC 81 MG tablet Take 1 tablet (81 mg total) by mouth daily.    . isosorbide mononitrate (IMDUR) 30 MG 24 hr tablet Take 1 tablet (30 mg total) by mouth daily. 30 tablet 3   No current facility-administered medications for this encounter.    BP 112/68 mmHg  Pulse 75  Wt 163 lb (73.936 kg)  SpO2 99% General: NAD Neck: No JVD,  no thyromegaly or thyroid nodule.  Lungs: Clear to auscultation bilaterally with normal respiratory effort. CV: Nondisplaced PMI.  Heart regular S1/S2, no S3/S4, no murmur.  No peripheral edema.  No carotid bruit.  Normal pedal pulses.  Abdomen: Soft, nontender, no hepatosplenomegaly, no distention.  Neurologic: Alert and oriented x 3.  Psych: Normal affect. Extremities: No clubbing or cyanosis.   Assessment/Plan: 1. PVCs: Frequent PVCs on 10/15 holter.  Echo with normal LV and RV systolic function.  Symptoms much improved on Toprol XL.  No PVCs when I listened to her heart on exam today.  Suspect PVC frequency is down.   2. Chest pain: She continues to have chest pain that is exertional but also sometimes at rest.  This is her main complaint. She had a negative Cardiolite in 11/15.  Her symptoms may be microvascular angina given the prominent exertional component.  Given her family history of CAD and her symptoms, I would like to directly image her coronaries before assuming microvascular angina.  I will arrange for coronary CT angiogram.  She will start ASA 81 mg daily and I will add Imdur 30 mg daily to see if this helps symptoms (this may work for microvascular angina).  3. Pulmonary hypertension: PA systolic pressure 49 mmHg on echo in 11/15.  Normal LV and RV size/systolic function.  No significant dyspnea but does get chest pressure as above.  PFTs were normal, no evidence for COPD.  CTA chest showed no acute PE.  Sleep study showed no OSA.  RHC was done in 4/16 and showed no evidence for pulmonary hypertension and low filling pressures. I suspect that her echo must have been a false positive study.  4. Hyperlipidemia: If she has significant plaque/coronary calcification on coronary CTA, she would be willing to restart a statin.  I would have her use Crestor 5 mg every other day. She had myalgias with simvastatin and another statin she does not remember (not Crestor).    Loralie Champagne 04/02/2015

## 2015-04-02 NOTE — Patient Instructions (Signed)
Start Isosorbide (Imdur) 30 mg daily  Start Aspirin 81 mg daily  Your physician has requested that you have cardiac CT. Cardiac computed tomography (CT) is a painless test that uses an x-ray machine to take clear, detailed pictures of your heart. For further information please visit [facility]. Please follow instruction sheet as given.  ONCE YOUR INSURANCE HAS APPROVED WE WILL CALL YOU TO SCHEDULE.  THE MORNING OF THE CT PLEASE TAKE METOPROLOL 25 MG (WHOLE TAB)   Your physician recommends that you schedule a follow-up appointment in: 1 month

## 2015-04-18 ENCOUNTER — Other Ambulatory Visit (HOSPITAL_COMMUNITY): Payer: Self-pay | Admitting: Cardiology

## 2015-04-18 DIAGNOSIS — R079 Chest pain, unspecified: Secondary | ICD-10-CM

## 2015-04-23 ENCOUNTER — Ambulatory Visit (HOSPITAL_COMMUNITY): Payer: Medicare Other

## 2015-04-25 ENCOUNTER — Ambulatory Visit (HOSPITAL_COMMUNITY): Payer: Medicare Other

## 2015-04-30 ENCOUNTER — Telehealth (HOSPITAL_COMMUNITY): Payer: Self-pay | Admitting: Vascular Surgery

## 2015-04-30 NOTE — Telephone Encounter (Signed)
Pt needs her CTA/ HEART rescheduled .Marland Kitchen A clinical person needs to call to reschedule they are asking for labs , weight and if the pt is allergic to contrast.. She like mornings. Any morning except Thursday's.. Please advise

## 2015-05-02 ENCOUNTER — Encounter (HOSPITAL_COMMUNITY): Payer: Medicare Other

## 2015-05-06 ENCOUNTER — Telehealth: Payer: Self-pay | Admitting: Family Medicine

## 2015-05-08 NOTE — Telephone Encounter (Signed)
Please schedule f/u or PE (her pref) in about 6 mo and refill until then

## 2015-05-08 NOTE — Telephone Encounter (Signed)
Last office visit 01/02/2015 for Sinusitis.  Last refilled 05/15/2014 for #90 with 3 refills.  No future appointments scheduled.  Ok to refill?

## 2015-05-09 NOTE — Telephone Encounter (Signed)
appt scheduled and med refilled 

## 2015-05-09 NOTE — Telephone Encounter (Signed)
Left voicemail requesting pt to call office back 

## 2015-05-09 NOTE — Telephone Encounter (Signed)
Pt returned your call. Please call back, thanks

## 2015-05-10 ENCOUNTER — Encounter: Payer: Self-pay | Admitting: Cardiology

## 2015-05-11 NOTE — Telephone Encounter (Signed)
Mary Gordon please schedule

## 2015-05-14 ENCOUNTER — Telehealth: Payer: Self-pay

## 2015-05-14 NOTE — Telephone Encounter (Signed)
05/14/15 Received 09/06/08 films from unknown origin on dumbwaiter pul doctor and put on shelf. [place]

## 2015-05-15 NOTE — Telephone Encounter (Signed)
Rescheduled coronary to  CTA 05/23/15 @ 11 am, arrival @ 10 am- [place] NPO 4 hours prior Pt will need pre procedure labs-BMET  Left message for pt to return call

## 2015-05-21 NOTE — Telephone Encounter (Signed)
Pt aware, will return for labs on 6/14

## 2015-05-22 ENCOUNTER — Ambulatory Visit (HOSPITAL_BASED_OUTPATIENT_CLINIC_OR_DEPARTMENT_OTHER)
Admission: RE | Admit: 2015-05-22 | Discharge: 2015-05-22 | Disposition: A | Discharge status: Home / Self Care | Payer: Medicare Other | Source: Ambulatory Visit | Attending: Internal Medicine | Admitting: Internal Medicine

## 2015-05-22 DIAGNOSIS — I5022 Chronic systolic (congestive) heart failure: Secondary | ICD-10-CM

## 2015-05-22 DIAGNOSIS — I272 Other secondary pulmonary hypertension: Secondary | ICD-10-CM | POA: Insufficient documentation

## 2015-05-22 LAB — BASIC METABOLIC PANEL
Anion gap: 6 (ref 5–15)
BUN: 9 mg/dL (ref 6–20)
CALCIUM: 9.1 mg/dL (ref 8.9–10.3)
CO2: 28 mmol/L (ref 22–32)
Chloride: 105 mmol/L (ref 101–111)
Creatinine, Ser: 0.89 mg/dL (ref 0.44–1.00)
GFR calc Af Amer: >60 mL/min (ref >60)
Glucose, Bld: 108 mg/dL — ABNORMAL HIGH (ref 65–99)
POTASSIUM: 4.6 mmol/L (ref 3.5–5.1)
Sodium: 139 mmol/L (ref 135–145)

## 2015-05-23 ENCOUNTER — Other Ambulatory Visit (HOSPITAL_COMMUNITY): Payer: Self-pay | Admitting: Cardiology

## 2015-05-23 ENCOUNTER — Ambulatory Visit (HOSPITAL_COMMUNITY)
Admission: RE | Admit: 2015-05-23 | Discharge: 2015-05-23 | Disposition: A | Discharge status: Home / Self Care | Payer: Medicare Other | Source: Ambulatory Visit | Attending: Internal Medicine | Admitting: Internal Medicine

## 2015-05-23 DIAGNOSIS — R079 Chest pain, unspecified: Secondary | ICD-10-CM

## 2015-05-23 MED ORDER — NITROGLYCERIN 0.4 MG SL SUBL
SUBLINGUAL_TABLET | SUBLINGUAL | Status: AC
  Filled 2015-05-23: qty 1

## 2015-05-23 MED ORDER — NITROGLYCERIN 0.4 MG SL SUBL
0.4000 mg | SUBLINGUAL_TABLET | Freq: Once | SUBLINGUAL | Status: DC
  Filled 2015-05-23: qty 25

## 2015-05-23 MED ORDER — IOHEXOL 350 MG/ML SOLN: 80.0000 mL | Freq: Once | INTRAVENOUS | Status: AC | PRN

## 2015-05-29 ENCOUNTER — Encounter (HOSPITAL_COMMUNITY): Payer: Self-pay

## 2015-05-29 ENCOUNTER — Ambulatory Visit (HOSPITAL_COMMUNITY)
Admission: RE | Admit: 2015-05-29 | Discharge: 2015-05-29 | Disposition: A | Discharge status: Home / Self Care | Payer: Medicare Other | Source: Ambulatory Visit | Attending: Cardiology | Admitting: Cardiology

## 2015-05-29 ENCOUNTER — Other Ambulatory Visit (HOSPITAL_COMMUNITY): Payer: Self-pay | Admitting: Cardiology

## 2015-05-29 DIAGNOSIS — R079 Chest pain, unspecified: Secondary | ICD-10-CM

## 2015-05-29 DIAGNOSIS — T82898A Other specified complication of vascular prosthetic devices, implants and grafts, initial encounter: Secondary | ICD-10-CM | POA: Diagnosis not present

## 2015-05-29 MED ORDER — IOHEXOL 350 MG/ML SOLN
80.0000 mL | Freq: Once | INTRAVENOUS | Status: AC | PRN
  Administered 2015-05-29: 80 mL via INTRAVENOUS

## 2015-05-29 MED ORDER — NITROGLYCERIN 0.4 MG SL SUBL
SUBLINGUAL_TABLET | SUBLINGUAL | Status: AC
Start: 2015-05-29 — End: 2015-05-29
  Filled 2015-05-29: qty 1

## 2015-05-29 MED ORDER — LIDOCAINE HCL 1 % IJ SOLN
INTRAMUSCULAR | Status: AC
  Filled 2015-05-29: qty 20

## 2015-05-29 MED ORDER — METOPROLOL TARTRATE 1 MG/ML IV SOLN
INTRAVENOUS | Status: AC
  Administered 2015-05-29: 2.5 mg via INTRAVENOUS
  Filled 2015-05-29: qty 5

## 2015-05-29 MED ORDER — METOPROLOL TARTRATE 1 MG/ML IV SOLN
2.5000 mg | Freq: Once | INTRAVENOUS | Status: AC
  Administered 2015-05-29: 2.5 mg via INTRAVENOUS
  Filled 2015-05-29: qty 5

## 2015-05-29 MED ORDER — NITROGLYCERIN 0.4 MG SL SUBL: 0.4000 mg | SUBLINGUAL_TABLET | SUBLINGUAL | Status: DC | PRN

## 2015-05-29 NOTE — Procedures (Signed)
RUE BRACHIAL IV START FOR CARDIAC CT NO COMP STABLE READY FOR USE

## 2015-06-12 ENCOUNTER — Encounter: Payer: Self-pay | Admitting: Family Medicine

## 2015-06-12 ENCOUNTER — Ambulatory Visit (INDEPENDENT_AMBULATORY_CARE_PROVIDER_SITE_OTHER): Payer: Medicare Other | Admitting: Family Medicine

## 2015-06-12 VITALS — BP 118/76 | HR 69 | Temp 98.2°F | Ht 64.0 in | Wt 163.8 lb

## 2015-06-12 DIAGNOSIS — R0989 Other specified symptoms and signs involving the circulatory and respiratory systems: Secondary | ICD-10-CM

## 2015-06-12 DIAGNOSIS — R221 Localized swelling, mass and lump, neck: Secondary | ICD-10-CM | POA: Diagnosis not present

## 2015-06-12 DIAGNOSIS — F458 Other somatoform disorders: Secondary | ICD-10-CM | POA: Diagnosis not present

## 2015-06-12 DIAGNOSIS — R5382 Chronic fatigue, unspecified: Secondary | ICD-10-CM

## 2015-06-12 LAB — TSH: TSH: 1.86 u[IU]/mL (ref 0.35–4.50)

## 2015-06-12 LAB — T4, FREE: Free T4: 0.71 ng/dL (ref 0.60–1.60)

## 2015-06-12 NOTE — Patient Instructions (Signed)
Thyroid tests today  Stop at check out to refer for ultrasound of thyroid   Will make a plan based on results

## 2015-06-12 NOTE — Progress Notes (Signed)
Pre visit review using our clinic review tool, if applicable. No additional management support is needed unless otherwise documented below in the visit note. 

## 2015-06-12 NOTE — Assessment & Plan Note (Signed)
Worsening over the past year On antihistamines On PPI No heartburn Does feel mass in thyroid area- [place] upcoming

## 2015-06-12 NOTE — Assessment & Plan Note (Signed)
Pt feels lump on L side of neck - around "adam's apple" - tender  In the past/ 2013 2 tiny nodules in L thyroid Re check thyroid US and update  ? If globus sens and fatigue are related  Lab for thyroid profile today

## 2015-06-12 NOTE — Progress Notes (Signed)
Subjective:    Patient ID: Mary Gordon, female    DOB: Jan 01, 1946, 69 y.o.   MRN: [id number]  HPI Here for feeling of a lump in throat /neck  Bothering her on the inside when she swallows - middle to left  Is sore  Constantly clearing throat also - bothersome - worse over the past year   Last visit with ENT was a long time ago    Still feels a lump on neck L side - larger  US thyroid 2013 - 2 very tiny nodules / not worrisome L thyroid   No runny nose or post drip or cough  gerd well controlled on protonix  Wt is stable   At one time felt difficulty swallowing a large pill   Lab Results  Component Value Date   TSH 2.530 09/08/2014    Taking antihistamines for allergies  tx by pulmonary   Patient Active Problem List   Diagnosis Date Noted  . Chest pain 02/19/2015  . Acute sinusitis 12/27/2014  . Rosacea 12/27/2014  . Pulmonary HTN 10/20/2014  . DOE (dyspnea on exertion) 09/26/2014  . PVCs (premature ventricular contractions) 09/26/2014  . Globus sensation 08/18/2014  . Left knee pain 08/18/2014  . Encounter for Medicare annual wellness exam 03/18/2014  . Skin tear of right lower leg without complication 08/65/7846  . Muscle pain 10/15/2012  . Fatigue 05/31/2012  . Fibromyalgia 05/31/2012  . Neck mass 05/31/2012  . Special screening for malignant neoplasms, colon 09/26/2011  . Hoarseness 07/25/2011  . GERD (gastroesophageal reflux disease) 07/08/2011  . CERVICAL RADICULOPATHY, RIGHT 09/04/2010  . SKIN RASH 04/22/2010  . PULMONARY NODULE 09/14/2009  . PALPITATIONS 08/17/2009  . UNSPECIFIED VITAMIN D DEFICIENCY 02/22/2009  . HYPERCHOLESTEROLEMIA 02/22/2009  . DEPRESSION 02/22/2009  . ALLERGIC RHINITIS 02/22/2009  . MENOPAUSAL SYNDROME 02/22/2009  . OSTEOPENIA 02/22/2009   Past Medical History  Diagnosis Date  . Palpitations   . High cholesterol   . Panic disorder   . Depression   . Osteopenia   . Vitamin D deficiency   . History of Clostridium  difficile infection     In past from augmentin  . Pulmonary nodule     Being observed  . Allergy     all year  . Cervical cancer 1978  . Cataract   . Anxiety   . GERD (gastroesophageal reflux disease)   . Fibromyalgia   . Bursitis of hip   . Pulmonary HTN 10/20/2014    moderate with PASP 103mmHg   Past Surgical History  Procedure Laterality Date  . Abdominal hysterectomy  1978  . Shoulder surgery  2008    Right shoulder bone spur  . Nasal hemorrhage control  2007  . 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 Colonoscopy  2002    Normal  . Right heart catheterization N/A 03/16/2015    Procedure: RIGHT HEART CATH;  Surgeon: Larey Dresser, MD;  Location: [facility] CATH LAB;  Service: Cardiovascular;  Laterality: N/A;   History  Substance Use Topics  . Smoking status: Former Smoker -- 1.00 packs/day for 17 years    Types: Cigarettes    Quit date: 12/08/1978  . Smokeless tobacco: Never Used  . Alcohol Use: No   Family History  Problem Relation Age of Onset  . Heart attack Mother 5  . Emphysema Mother   . Allergies Mother   . Asthma Mother   . Allergies Father   . Arrhythmia Sister  Atrial fibrillation  . Cancer Sister     breast  . Diabetes Other     Parent  . Hyperlipidemia Other     Other relative  . Hypertension Other     Parent, other relative  . Glaucoma Father   . Breast cancer Other     Other relative, sister  . Allergies Other     Siblings  . Asthma Other     Sisters  . Emphysema Sister   . Heart disease Sister   . Rheumatologic disease Sister   . Lung cancer Sister   . Colon cancer Neg Hx   . Esophageal cancer Neg Hx   . Rectal cancer Neg Hx   . Stomach cancer Neg Hx    Allergies  Allergen Reactions  . Amoxicillin-Pot Clavulanate Other (See Comments)    Cdiff  . Simvastatin     myalgias   Current Outpatient Prescriptions on File Prior to Visit  Medication Sig Dispense Refill  . aspirin EC 81 MG tablet Take 1 tablet (81 mg total) by  mouth daily.    . Cetirizine HCl (ZYRTEC ALLERGY PO) Take 1 tablet by mouth daily.    . chlorpheniramine (CHLOR-TRIMETON) 4 MG tablet Take 8 mg by mouth 2 (two) times daily as needed for allergies.    Marland Kitchen estradiol (ESTRACE) 1 MG tablet TAKE 1 TABLET (1 MG TOTAL) BY MOUTH DAILY. 100 tablet 4  . isosorbide mononitrate (IMDUR) 30 MG 24 hr tablet Take 1 tablet (30 mg total) by mouth daily. 30 tablet 3  . metoprolol succinate (TOPROL-XL) 25 MG 24 hr tablet TAKE 1/2 TABLET BY MOUTH DAILY 15 tablet 3  . pantoprazole (PROTONIX) 40 MG tablet Take 1 tablet (40 mg total) by mouth daily. 30 tablet 6  . traZODone (DESYREL) 100 MG tablet TAKE 1 TABLET BY MOUTH AT BEDTIME. 90 tablet 1   No current facility-administered medications on file prior to visit.       Review of Systems    Review of Systems  Constitutional: Negative for fever, appetite change,  and unexpected weight change. pos for fatigue  Eyes: Negative for pain and visual disturbance.  ENT pos for post nasal drip and throat clearing and globus sensation  Respiratory: Negative for cough and shortness of breath.  Cardiovascular: Negative for cp or palpitations    Gastrointestinal: Negative for nausea, diarrhea and constipation.  Genitourinary: Negative for urgency and frequency.  Skin: Negative for pallor or rash   Neurological: Negative for weakness, light-headedness, numbness and headaches.  Hematological: Negative for adenopathy. Does not bruise/bleed easily.  Psychiatric/Behavioral: Negative for dysphoric mood. The patient is not nervous/anxious.      Objective:   Physical Exam  Constitutional: She appears well-developed and well-nourished. No distress.  HENT:  Head: Normocephalic and atraumatic.  Right Ear: External ear normal.  Left Ear: External ear normal.  Mouth/Throat: Oropharynx is clear and moist. No oropharyngeal exudate.  Mild clear post nasal drip and constant throat clearing   Eyes: Conjunctivae and EOM are normal.  Pupils are equal, round, and reactive to light. Right eye exhibits no discharge. Left eye exhibits no discharge. No scleral icterus.  Neck: Normal range of motion. Neck supple. No JVD present. No tracheal deviation present.  L side of trachea and thyroid area  does feel fuller  No LN    Cardiovascular: Normal rate and regular rhythm.   Pulmonary/Chest: Breath sounds normal. No respiratory distress. She has no wheezes. She has no rales.  Abdominal: Soft. Bowel sounds  are normal. There is no tenderness.  Musculoskeletal: She exhibits no edema.  Lymphadenopathy:    She has no cervical adenopathy.  Neurological: She is alert. She has normal reflexes. She displays no tremor. No cranial nerve deficit. She exhibits normal muscle tone. Coordination normal.  Skin: Skin is warm and dry. No rash noted. She is not diaphoretic. No erythema. No pallor.  Psychiatric: She has a normal mood and affect.          Assessment & Plan:   Problem List Items Addressed This Visit    Fatigue    Worse now  Thyroid lab today      Relevant Orders   TSH (Completed)   T4, Free (Completed)   T3 Uptake (Completed)   Globus sensation    Worsening over the past year On antihistamines On PPI No heartburn Does feel mass in thyroid area- [place] upcoming       Relevant Orders   US Soft Tissue Head/Neck (Completed)   Neck mass - Primary    Pt feels lump on L side of neck - around "adam's apple" - tender  In the past/ 2013 2 tiny nodules in L thyroid Re check thyroid US and update  ? If globus sens and fatigue are related  Lab for thyroid profile today      Relevant Orders   US Soft Tissue Head/Neck (Completed)   TSH (Completed)   T4, Free (Completed)   T3 Uptake (Completed)

## 2015-06-12 NOTE — Assessment & Plan Note (Signed)
Worse now  Thyroid lab today

## 2015-06-13 ENCOUNTER — Other Ambulatory Visit: Payer: Self-pay | Admitting: Family Medicine

## 2015-06-13 LAB — T3 UPTAKE: T3 Uptake: 26 % (ref 22–35)

## 2015-06-13 NOTE — Telephone Encounter (Signed)
Rx declined due to pt not taking med anymore, confirmed this with pt

## 2015-06-14 ENCOUNTER — Ambulatory Visit
Admission: RE | Admit: 2015-06-14 | Discharge: 2015-06-14 | Disposition: A | Discharge status: Home / Self Care | Payer: Medicare Other | Source: Ambulatory Visit | Attending: Family Medicine | Admitting: Family Medicine

## 2015-06-14 DIAGNOSIS — E041 Nontoxic single thyroid nodule: Secondary | ICD-10-CM | POA: Diagnosis not present

## 2015-06-14 DIAGNOSIS — R221 Localized swelling, mass and lump, neck: Secondary | ICD-10-CM

## 2015-06-14 DIAGNOSIS — R0989 Other specified symptoms and signs involving the circulatory and respiratory systems: Secondary | ICD-10-CM

## 2015-06-15 ENCOUNTER — Telehealth: Payer: Self-pay | Admitting: Family Medicine

## 2015-06-15 DIAGNOSIS — R221 Localized swelling, mass and lump, neck: Secondary | ICD-10-CM

## 2015-06-15 NOTE — Telephone Encounter (Signed)
Will route order to [facility]

## 2015-06-15 NOTE — Telephone Encounter (Signed)
-----   Message from Mary Gordon, [place] sent at 06/15/2015  4:40 PM EDT ----- Pt viewed results on Mychart and she does want to proceed with CT scan, I advise pt our [facility] will call to schedule appt

## 2015-06-19 ENCOUNTER — Ambulatory Visit (INDEPENDENT_AMBULATORY_CARE_PROVIDER_SITE_OTHER)
Admission: RE | Admit: 2015-06-19 | Discharge: 2015-06-19 | Disposition: A | Discharge status: Home / Self Care | Payer: Medicare Other | Source: Ambulatory Visit | Attending: Family Medicine | Admitting: Family Medicine

## 2015-06-19 ENCOUNTER — Encounter: Payer: Self-pay | Admitting: Family Medicine

## 2015-06-19 DIAGNOSIS — E041 Nontoxic single thyroid nodule: Secondary | ICD-10-CM | POA: Diagnosis not present

## 2015-06-19 DIAGNOSIS — R221 Localized swelling, mass and lump, neck: Secondary | ICD-10-CM | POA: Diagnosis not present

## 2015-06-19 MED ORDER — IOHEXOL 300 MG/ML  SOLN
75.0000 mL | Freq: Once | INTRAMUSCULAR | Status: AC | PRN
  Administered 2015-06-19: 75 mL via INTRAVENOUS

## 2015-06-20 ENCOUNTER — Telehealth: Payer: Self-pay | Admitting: Family Medicine

## 2015-06-20 ENCOUNTER — Ambulatory Visit (HOSPITAL_COMMUNITY)
Admission: RE | Admit: 2015-06-20 | Discharge: 2015-06-20 | Disposition: A | Discharge status: Home / Self Care | Payer: Medicare Other | Source: Ambulatory Visit | Attending: Cardiology | Admitting: Cardiology

## 2015-06-20 VITALS — BP 122/72 | HR 80 | Wt 163.1 lb

## 2015-06-20 DIAGNOSIS — Z79899 Other long term (current) drug therapy: Secondary | ICD-10-CM | POA: Insufficient documentation

## 2015-06-20 DIAGNOSIS — R0609 Other forms of dyspnea: Secondary | ICD-10-CM

## 2015-06-20 DIAGNOSIS — Z8249 Family history of ischemic heart disease and other diseases of the circulatory system: Secondary | ICD-10-CM | POA: Insufficient documentation

## 2015-06-20 DIAGNOSIS — M797 Fibromyalgia: Secondary | ICD-10-CM | POA: Diagnosis not present

## 2015-06-20 DIAGNOSIS — E785 Hyperlipidemia, unspecified: Secondary | ICD-10-CM | POA: Diagnosis not present

## 2015-06-20 DIAGNOSIS — Z7982 Long term (current) use of aspirin: Secondary | ICD-10-CM | POA: Diagnosis not present

## 2015-06-20 DIAGNOSIS — Q892 Congenital malformations of other endocrine glands: Secondary | ICD-10-CM | POA: Insufficient documentation

## 2015-06-20 DIAGNOSIS — Z87891 Personal history of nicotine dependence: Secondary | ICD-10-CM | POA: Insufficient documentation

## 2015-06-20 DIAGNOSIS — R0789 Other chest pain: Secondary | ICD-10-CM | POA: Diagnosis not present

## 2015-06-20 DIAGNOSIS — I272 Other secondary pulmonary hypertension: Secondary | ICD-10-CM | POA: Diagnosis not present

## 2015-06-20 DIAGNOSIS — E559 Vitamin D deficiency, unspecified: Secondary | ICD-10-CM | POA: Diagnosis not present

## 2015-06-20 DIAGNOSIS — Z8541 Personal history of malignant neoplasm of cervix uteri: Secondary | ICD-10-CM | POA: Insufficient documentation

## 2015-06-20 DIAGNOSIS — E7800 Pure hypercholesterolemia, unspecified: Secondary | ICD-10-CM

## 2015-06-20 DIAGNOSIS — R5383 Other fatigue: Secondary | ICD-10-CM

## 2015-06-20 DIAGNOSIS — K219 Gastro-esophageal reflux disease without esophagitis: Secondary | ICD-10-CM | POA: Insufficient documentation

## 2015-06-20 DIAGNOSIS — R079 Chest pain, unspecified: Secondary | ICD-10-CM

## 2015-06-20 DIAGNOSIS — I493 Ventricular premature depolarization: Secondary | ICD-10-CM

## 2015-06-20 DIAGNOSIS — I208 Other forms of angina pectoris: Secondary | ICD-10-CM

## 2015-06-20 LAB — BASIC METABOLIC PANEL
Anion gap: 6 (ref 5–15)
CALCIUM: 9.3 mg/dL (ref 8.9–10.3)
CHLORIDE: 106 mmol/L (ref 101–111)
CO2: 29 mmol/L (ref 22–32)
Creatinine, Ser: 1.06 mg/dL — ABNORMAL HIGH (ref 0.44–1.00)
GFR, EST NON AFRICAN AMERICAN: 53 mL/min — AB (ref >60)
GLUCOSE: 110 mg/dL — AB (ref 65–99)
Potassium: 4.6 mmol/L (ref 3.5–5.1)
Sodium: 141 mmol/L (ref 135–145)

## 2015-06-20 MED ORDER — CEFDINIR 300 MG PO CAPS: 300.0000 mg | ORAL_CAPSULE | Freq: Two times a day (BID) | ORAL | Status: DC

## 2015-06-20 MED ORDER — ISOSORBIDE MONONITRATE ER 60 MG PO TB24: 60.0000 mg | ORAL_TABLET | Freq: Every day | ORAL | Status: DC

## 2015-06-20 NOTE — Patient Instructions (Addendum)
Increase Imdur to 60 mg daily, you can take 2 of your 30 mg tablets daily until you run out, THEN we have sent you in a new prescription for 60 mg tablets  Lab today  We will get your Coronary CT pre-certed with your insurance and call you to schedule  We will contact you in 4 months to schedule your next appointment.

## 2015-06-20 NOTE — Telephone Encounter (Signed)
Ref to ENT Will route to [facility]  See mychart note/results from CT Pt aware we will call

## 2015-06-20 NOTE — Progress Notes (Signed)
Patient ID: Mary Gordon, female   DOB: June 06, 1946, 69 y.o.   MRN: [id number] PCP: Dr. Glori Bickers  69 yo with history of hyperlipidemia, chest pain, and palpitations presented initially for evaluation of pulmonary hypertension.  She was seen by Dr. Radford Pax for episodes central, substernal chest pressure that happens almost daily for > 6 months.  It lasts seconds to minutes and comes with exertion such as walking up steps or walking fast. Occasional has chest pain at rest.    Initially, she had ETT-Cardiolite done in 11/15 for the chest pressure.  This showed no ischemia or infarction.  Echo done in 11/15 also showed normal EF but PA systolic pressure was elevated at 49 mmHg.  The RV appeared normal.  She then had PFTs done in 12/15 that were normal.  She saw Dr. Gwenette Greet and is not thought to have COPD or asthma.  She had a CTA chest in 11/15 showing no acute findings, no PE.  Sleep study showed no OSA.  Given the palpitations, she had holter in 10/15, showing frequent PVCs (12.5% total beats).  She was started on Toprol XL with almost complete resolution of palpitations.  She has chronic muscle pain with focal points that is thought to be due to fibromyalgia.  I did a right heart cath in 4/16 showing no pulmonary hypertension and low filling pressures.    She presents today for follow up. At her last visit we added aspirin and Imdur. She says Imdur has definitely helped her CP. She initially had severe headaches but have become much more manageable. She says she has not had a "full episode" of CP since she has been on it.  Has had some tightness, but no episodes with CP with dizziness and SOB as have occurred in the past. Occurs several times a week, improves from several times a day.  Usually when walking up an incline.  She has rapid breathing when her chest feels tight, but denies overt SOB.  She can do anything she likes to do, and walk as far as she wants on flat ground.  Occasional palpitations, but much  improved on Toprol XL.  Denies lightheaded or dizziness.  Has upcoming surgery for thyroglossal duct cyst.  We attempted cardiac CT but there was a scanner malfunction on the day she presented and the study has not been done.   Labs (10/15): LDL 147, HDL 67 Labs (3/16): BNP 25, RF normal Labs (4/16): K 4.4, creatinine 0.9 Labs (6/16) K 4.6, Creatinine 0.89  Allergies:  1)  ! Augmentin  Past Medical History: 1. Palpitations: 48 hour holter in 7/12 with occasional PACs. Holter (10/15) with frequent PVCs, 12.5% of total.   2. Hyperlipidemia: Myalgias with simvastatin and another statin that she does not remember 3. Panic Disorder 4. Depression 5. Cervical cancer 6. osteopenia  7. vit D def  8. C difficile in past from Augmentin 9. Chest pain: ETT-myoview (7/12) with EF 79%, no ischemia or infarction. ETT-Cardiolite (11/15) with 6 min exercise, not gated, no ischemia or infarction.  10. Pulmonary hypertension: Echo (11/15) with EF 60-65%, mild MR, normal RV size and systolic function, PA systolic pressure 49 mmHg.  CTA chest (11/15) with no evidence for PE.  Sleep study (12/15) with no OSA.  PFTs (12/15) with normal spirometry.  RHC (4/16) with mean RA 2, PA 21/3 mean 11, PCWP mean 2, CI 2.81 Fick 2.7 thermo. Possible false negative PA pressure elevation by echo.  11. Rosacea 12. Fibromyalgia 13. 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   Current Outpatient Prescriptions  Medication Sig Dispense Refill  . aspirin EC 81 MG tablet Take 1 tablet (81 mg total) by mouth daily.    . cefdinir (OMNICEF) 300 MG capsule Take 1 capsule (300 mg total) by mouth 2 (two) times  daily. 14 capsule 0  . Cetirizine HCl (ZYRTEC ALLERGY PO) Take 1 tablet by mouth daily.    . chlorpheniramine (CHLOR-TRIMETON) 4 MG tablet Take 8 mg by mouth 2 (two) times daily as needed for allergies.    Marland Kitchen estradiol (ESTRACE) 1 MG tablet TAKE 1 TABLET (1 MG TOTAL) BY MOUTH DAILY. 100 tablet 4  . isosorbide mononitrate (IMDUR) 30 MG 24 hr tablet Take 1 tablet (30 mg total) by mouth daily. 30 tablet 3  . metoprolol succinate (TOPROL-XL) 25 MG 24 hr tablet TAKE 1/2 TABLET BY MOUTH DAILY 15 tablet 3  . pantoprazole (PROTONIX) 40 MG tablet Take 1 tablet (40 mg total) by mouth daily. 30 tablet 6  . traZODone (DESYREL) 100 MG tablet TAKE 1 TABLET BY MOUTH AT BEDTIME. 90 tablet 1   No current facility-administered medications for this encounter.    There were no vitals taken for this visit. General: NAD Neck: No JVD, no thyromegaly or thyroid nodule.  Lungs: Clear to auscultation bilaterally with normal respiratory effort. CV: Nondisplaced PMI.  Heart regular S1/S2, no S3/S4, no murmur.  No peripheral edema.  No carotid bruit.  Normal pedal pulses.  Abdomen: Soft, nontender, no hepatosplenomegaly, no distention.  Neurologic: Alert and oriented x 3.  Psych: Normal affect. Extremities: No clubbing or cyanosis.   Assessment/Plan: 1. PVCs: Frequent PVCs on 10/15 holter.  Echo with normal LV and RV systolic function.   - Says she feels them very rarely now on Toprol. Not limiting, 2. Chest pain: Greatly improved on Imdur.  This is her main complaint. She had a negative Cardiolite in 11/15.  Her symptoms may be microvascular angina given the prominent exertional component.  Given her family history of CAD and her symptoms, would like to directly image her coronaries before assuming microvascular angina.   - We will try to reschedule coronary CT angiogram.    - Cont ASA 81 mg daily  - Increase Imdur to 60 mg daily, can back down if continues to have headaches. 3. Pulmonary hypertension: PA systolic  pressure 49 mmHg on echo in 11/15.  Normal LV and RV size/systolic function.  No significant dyspnea but chest pressure as above.  PFTs normal, no evidence for COPD.  CTA chest showed no acute PE.   - Sleep study with no OSA.   - RHC 4/16  showed no evidence for pulmonary hypertension and low filling pressures.  - Suspect that her echo must have been a false positive study.  4. Hyperlipidemia: If she has significant plaque/coronary calcification on coronary CTA, she is willing to restart a statin.  Would have her use Crestor 5 mg every other day. She had myalgias with simvastatin and another statin she does not remember (not Crestor).   5. Thyroglossal cyst - Upcoming surgery  Follow up in 4 months if CT looks ok.    Legrand Como  Joesph July PA-C 06/20/2015  Patient seen with PA, agree with the above note.  We will reschedule coronary CT angiogram.  Increase Imdur to 60 mg daily.  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   Loralie Champagne 06/20/2015

## 2015-06-28 DIAGNOSIS — Q892 Congenital malformations of other endocrine glands: Secondary | ICD-10-CM | POA: Diagnosis not present

## 2015-07-02 ENCOUNTER — Ambulatory Visit (HOSPITAL_COMMUNITY): Admission: RE | Admit: 2015-07-02 | Payer: Medicare Other | Source: Ambulatory Visit

## 2015-07-09 ENCOUNTER — Other Ambulatory Visit: Payer: Self-pay | Admitting: Cardiology

## 2015-07-11 ENCOUNTER — Encounter (HOSPITAL_COMMUNITY): Payer: Self-pay

## 2015-07-16 ENCOUNTER — Other Ambulatory Visit: Payer: Self-pay | Admitting: Otolaryngology

## 2015-07-16 DIAGNOSIS — Q892 Congenital malformations of other endocrine glands: Secondary | ICD-10-CM | POA: Diagnosis not present

## 2015-09-01 ENCOUNTER — Other Ambulatory Visit (HOSPITAL_COMMUNITY): Payer: Self-pay | Admitting: Cardiology

## 2015-09-11 ENCOUNTER — Ambulatory Visit (HOSPITAL_COMMUNITY)
Admission: RE | Admit: 2015-09-11 | Discharge: 2015-09-11 | Disposition: A | Discharge status: Home / Self Care | Payer: Medicare Other | Source: Ambulatory Visit | Attending: Cardiology | Admitting: Cardiology

## 2015-09-11 ENCOUNTER — Encounter (HOSPITAL_COMMUNITY): Payer: Self-pay

## 2015-09-11 DIAGNOSIS — J8410 Pulmonary fibrosis, unspecified: Secondary | ICD-10-CM | POA: Insufficient documentation

## 2015-09-11 DIAGNOSIS — E7800 Pure hypercholesterolemia, unspecified: Secondary | ICD-10-CM | POA: Insufficient documentation

## 2015-09-11 DIAGNOSIS — R002 Palpitations: Secondary | ICD-10-CM | POA: Insufficient documentation

## 2015-09-11 DIAGNOSIS — I878 Other specified disorders of veins: Secondary | ICD-10-CM

## 2015-09-11 DIAGNOSIS — I208 Other forms of angina pectoris: Secondary | ICD-10-CM

## 2015-09-11 DIAGNOSIS — I493 Ventricular premature depolarization: Secondary | ICD-10-CM | POA: Insufficient documentation

## 2015-09-11 DIAGNOSIS — R5383 Other fatigue: Secondary | ICD-10-CM | POA: Insufficient documentation

## 2015-09-11 DIAGNOSIS — R079 Chest pain, unspecified: Secondary | ICD-10-CM | POA: Diagnosis not present

## 2015-09-11 DIAGNOSIS — R0609 Other forms of dyspnea: Secondary | ICD-10-CM | POA: Diagnosis not present

## 2015-09-11 DIAGNOSIS — I998 Other disorder of circulatory system: Secondary | ICD-10-CM | POA: Diagnosis not present

## 2015-09-11 DIAGNOSIS — K449 Diaphragmatic hernia without obstruction or gangrene: Secondary | ICD-10-CM | POA: Diagnosis not present

## 2015-09-11 DIAGNOSIS — R918 Other nonspecific abnormal finding of lung field: Secondary | ICD-10-CM | POA: Insufficient documentation

## 2015-09-11 DIAGNOSIS — I272 Other secondary pulmonary hypertension: Secondary | ICD-10-CM | POA: Diagnosis not present

## 2015-09-11 DIAGNOSIS — R0789 Other chest pain: Secondary | ICD-10-CM

## 2015-09-11 LAB — POCT I-STAT CREATININE: CREATININE: 0.4 mg/dL — AB (ref 0.44–1.00)

## 2015-09-11 MED ORDER — NITROGLYCERIN 0.4 MG SL SUBL
SUBLINGUAL_TABLET | SUBLINGUAL | Status: AC
  Administered 2015-09-11: 0.4 mg via SUBLINGUAL
  Filled 2015-09-11: qty 1

## 2015-09-11 MED ORDER — METOPROLOL TARTRATE 1 MG/ML IV SOLN
5.0000 mg | INTRAVENOUS | Status: DC | PRN
  Administered 2015-09-11: 5 mg via INTRAVENOUS
  Filled 2015-09-11: qty 5

## 2015-09-11 MED ORDER — IOHEXOL 350 MG/ML SOLN
80.0000 mL | Freq: Once | INTRAVENOUS | Status: AC | PRN
  Administered 2015-09-11: 80 mL via INTRAVENOUS

## 2015-09-11 MED ORDER — NITROGLYCERIN 0.4 MG SL SUBL
0.4000 mg | SUBLINGUAL_TABLET | SUBLINGUAL | Status: DC | PRN
  Administered 2015-09-11: 0.4 mg via SUBLINGUAL
  Filled 2015-09-11: qty 25

## 2015-09-11 MED ORDER — METOPROLOL TARTRATE 1 MG/ML IV SOLN
INTRAVENOUS | Status: AC
  Administered 2015-09-11: 5 mg via INTRAVENOUS
  Filled 2015-09-11: qty 10

## 2015-09-11 MED ORDER — LIDOCAINE HCL 1 % IJ SOLN
INTRAMUSCULAR | Status: AC
  Filled 2015-09-11: qty 20

## 2015-09-11 NOTE — Procedures (Signed)
US guided placement of a right brachial 4 french micropuncture sheath performed for venous access for cardiac CTA. No immediate complications.

## 2015-09-11 NOTE — Progress Notes (Signed)
IV from R ac d/c'd noted hematoma after removal, manual pressure applied with resolution of firm site, applied ice to area

## 2015-09-17 ENCOUNTER — Encounter: Payer: Self-pay | Admitting: Family Medicine

## 2015-09-17 ENCOUNTER — Ambulatory Visit (INDEPENDENT_AMBULATORY_CARE_PROVIDER_SITE_OTHER): Payer: Medicare Other | Admitting: Family Medicine

## 2015-09-17 VITALS — BP 114/68 | HR 78 | Temp 98.1°F | Ht 64.0 in | Wt 165.8 lb

## 2015-09-17 DIAGNOSIS — Z23 Encounter for immunization: Secondary | ICD-10-CM | POA: Diagnosis not present

## 2015-09-17 DIAGNOSIS — F430 Acute stress reaction: Secondary | ICD-10-CM

## 2015-09-17 DIAGNOSIS — E7800 Pure hypercholesterolemia, unspecified: Secondary | ICD-10-CM | POA: Diagnosis not present

## 2015-09-17 DIAGNOSIS — K219 Gastro-esophageal reflux disease without esophagitis: Secondary | ICD-10-CM

## 2015-09-17 DIAGNOSIS — F4323 Adjustment disorder with mixed anxiety and depressed mood: Secondary | ICD-10-CM | POA: Diagnosis not present

## 2015-09-17 DIAGNOSIS — I208 Other forms of angina pectoris: Secondary | ICD-10-CM | POA: Diagnosis not present

## 2015-09-17 LAB — LIPID PANEL
CHOL/HDL RATIO: 4
CHOLESTEROL: 225 mg/dL — AB (ref 0–200)
HDL: 60.5 mg/dL (ref >39.00)
LDL Cholesterol: 130 mg/dL — ABNORMAL HIGH (ref 0–99)
NonHDL: 164.68
TRIGLYCERIDES: 175 mg/dL — AB (ref 0.0–149.0)
VLDL: 35 mg/dL (ref 0.0–40.0)

## 2015-09-17 MED ORDER — TRAZODONE HCL 100 MG PO TABS: 100.0000 mg | ORAL_TABLET | Freq: Every day | ORAL | Status: DC

## 2015-09-17 MED ORDER — PANTOPRAZOLE SODIUM 40 MG PO TBEC: DELAYED_RELEASE_TABLET | ORAL | Status: DC

## 2015-09-17 NOTE — Assessment & Plan Note (Signed)
Well controlled with protonix (omeprazole did not work) Refilled  [employer]

## 2015-09-17 NOTE — Progress Notes (Signed)
Subjective:    Patient ID: Mary Gordon, female    DOB: 12-25-1945, 69 y.o.   MRN: [id number]  HPI Here for f/u of chronic health problems   Wt is up 2 lb with bmi of 28   Has been doing pretty well overall    Takes trazadone for mood  Sleeping well  Mood has changed however - stressor  Husband - refuses to "get himself checked" -for a memory problem - she thinks he may be on the spectrum for autism as well  She gets upset at him for forgetting things and also cognition (he is on prozac)  In the past he even had violent outbursts (they addressed that)  She is having a hard time dealing with it and she is irritable and on the edge   Getting a flu shot today     Hx of hyperlipidemia   Takes protonix for her gerd - works quite well   Goes to gyn  ? Last dexa  On estrace    Patient Active Problem List   Diagnosis Date Noted  . Stress reaction 09/17/2015  . 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 Chest pain 02/19/2015  . Acute sinusitis 12/27/2014  . Rosacea 12/27/2014  . Pulmonary HTN ([place]) 10/20/2014  . DOE (dyspnea on exertion) 09/26/2014  . PVCs (premature ventricular contractions) 09/26/2014  . Globus sensation 08/18/2014  . Left knee pain 08/18/2014  . Encounter for Medicare annual wellness exam 03/18/2014  . Skin tear of right lower leg without complication 63/89/3734  . Muscle pain 10/15/2012  . Fatigue 05/31/2012  . Fibromyalgia 05/31/2012  . Neck mass 05/31/2012  . Special screening for malignant neoplasms, colon 09/26/2011  . Hoarseness 07/25/2011  . GERD (gastroesophageal reflux disease) 07/08/2011  . CERVICAL RADICULOPATHY, RIGHT 09/04/2010  . SKIN RASH 04/22/2010  . PULMONARY NODULE 09/14/2009  . PALPITATIONS 08/17/2009  . UNSPECIFIED VITAMIN D DEFICIENCY 02/22/2009  . HYPERCHOLESTEROLEMIA 02/22/2009  . Adjustment disorder with mixed anxiety and depressed mood 02/22/2009  . ALLERGIC RHINITIS 02/22/2009  . MENOPAUSAL SYNDROME 02/22/2009  .  OSTEOPENIA 02/22/2009   Past Medical History  Diagnosis Date  . Palpitations   . High cholesterol   . Panic disorder   . Depression   . Osteopenia   . Vitamin D deficiency   . History of Clostridium difficile infection     In past from augmentin  . Pulmonary nodule     Being observed  . Allergy     all year  . Cervical cancer ([place]) 1978  . Cataract   . Anxiety   . GERD (gastroesophageal reflux disease)   . Fibromyalgia   . Bursitis of hip   . Pulmonary HTN ([place]) 10/20/2014    moderate with PASP 45mmHg   Past Surgical History  Procedure Laterality Date  . Abdominal hysterectomy  1978  . Shoulder surgery  2008    Right shoulder bone spur  . Nasal hemorrhage control  2007  . 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 Colonoscopy  2002    Normal  . Right heart catheterization N/A 03/16/2015    Procedure: RIGHT HEART CATH;  Surgeon: Larey Dresser, MD;  Location: [facility] CATH LAB;  Service: Cardiovascular;  Laterality: N/A;   Social History  Substance Use Topics  . Smoking status: Former Smoker -- 1.00 packs/day for 17 years    Types: Cigarettes    Quit date: 12/08/1978  . Smokeless tobacco: Never Used  . Alcohol Use:  No   Family History  Problem Relation Age of Onset  . Heart attack Mother 40  . Emphysema Mother   . Allergies Mother   . Asthma Mother   . Allergies Father   . Arrhythmia Sister     Atrial fibrillation  . Cancer Sister     breast  . Diabetes Other     Parent  . Hyperlipidemia Other     Other relative  . Hypertension Other     Parent, other relative  . Glaucoma Father   . Breast cancer Other     Other relative, sister  . Allergies Other     Siblings  . Asthma Other     Sisters  . Emphysema Sister   . Heart disease Sister   . Rheumatologic disease Sister   . Lung cancer Sister   . Colon cancer Neg Hx   . Esophageal cancer Neg Hx   . Rectal cancer Neg Hx   . Stomach cancer Neg Hx    Allergies  Allergen Reactions  . Adhesive [Tape]  Rash    Burns skins   . Amoxicillin-Pot Clavulanate Other (See Comments)    Cdiff  . Simvastatin     myalgias   Current Outpatient Prescriptions on File Prior to Visit  Medication Sig Dispense Refill  . aspirin EC 81 MG tablet Take 1 tablet (81 mg total) by mouth daily.    . Cetirizine HCl (ZYRTEC ALLERGY PO) Take 1 tablet by mouth daily.    . chlorpheniramine (CHLOR-TRIMETON) 4 MG tablet Take 8 mg by mouth 2 (two) times daily as needed for allergies.    Marland Kitchen estradiol (ESTRACE) 1 MG tablet TAKE 1 TABLET (1 MG TOTAL) BY MOUTH DAILY. 100 tablet 4  . isosorbide mononitrate (IMDUR) 60 MG 24 hr tablet Take 1 tablet (60 mg total) by mouth daily. 30 tablet 3  . metoprolol succinate (TOPROL-XL) 25 MG 24 hr tablet TAKE 1/2 TABLET BY MOUTH DAILY 15 tablet 6  . pantoprazole (PROTONIX) 40 MG tablet TAKE 1 TABLET (40 MG TOTAL) BY MOUTH DAILY. 30 tablet 6  . traZODone (DESYREL) 100 MG tablet TAKE 1 TABLET BY MOUTH AT BEDTIME. 90 tablet 1   No current facility-administered medications on file prior to visit.    Review of Systems    Review of Systems  Constitutional: Negative for fever, appetite change, fatigue and unexpected weight change.  Eyes: Negative for pain and visual disturbance.  Respiratory: Negative for cough and shortness of breath.   Cardiovascular: Negative for cp or palpitations    Gastrointestinal: Negative for nausea, diarrhea and constipation. neg for heartburn  Genitourinary: Negative for urgency and frequency.  Skin: Negative for pallor or rash  had 2 knots behind both knees for a while - unsure what caused  Neurological: Negative for weakness, light-headedness, numbness and headaches.  Hematological: Negative for adenopathy. Does not bruise/bleed easily.  Psychiatric/Behavioral: pos for dysphoric mood and anxiety with significant stressors      Objective:   Physical Exam  Constitutional: She appears well-developed and well-nourished. No distress.  Well appearing   HENT:    Head: Normocephalic and atraumatic.  Mouth/Throat: Oropharynx is clear and moist.  Eyes: Conjunctivae and EOM are normal. Pupils are equal, round, and reactive to light.  Neck: Normal range of motion. Neck supple. No JVD present. Carotid bruit is not present. No thyromegaly present.  Cardiovascular: Normal rate, regular rhythm, normal heart sounds and intact distal pulses.  Exam reveals no gallop.   Pulmonary/Chest: Effort  normal and breath sounds normal. No respiratory distress. She has no wheezes. She has no rales.  No crackles  Abdominal: Soft. Bowel sounds are normal. She exhibits no distension, no abdominal bruit and no mass. There is no tenderness.  Musculoskeletal: She exhibits no edema.  Lymphadenopathy:    She has no cervical adenopathy.  Neurological: She is alert. She has normal reflexes.  Skin: Skin is warm and dry. No rash noted.  Psychiatric: Her speech is normal. Her mood appears anxious. Her affect is not blunt, not labile and not inappropriate. She is agitated. She is not slowed and not withdrawn. Thought content is not paranoid. Cognition and memory are normal. She exhibits a depressed mood. She expresses no homicidal and no suicidal ideation.  Pt is generally upset and agitated over situation with her husband Disc stressors freely and with fair insight           Assessment & Plan:   Problem List Items Addressed This Visit      Digestive   GERD (gastroesophageal reflux disease)    Well controlled with protonix (omeprazole did not work) Refilled  [employer]       Relevant Medications   pantoprazole (PROTONIX) 40 MG tablet     Other   Adjustment disorder with mixed anxiety and depressed mood - Primary    Continue trazadone Reviewed stressors/ coping techniques/symptoms/ support sources/ tx options and side effects in detail today - worse issues with stressors lately  Offered counseling ref for this and disc coping skills in length       HYPERCHOLESTEROLEMIA     Lipid panel today Disc goals for lipids and reasons to control them Rev labs with pt-from prev labs  Rev low sat fat diet in detail       Relevant Orders   Lipid panel (Completed)   Stress reaction    Long disc regarding stressors- primarily her husband having cognition problems and poss other issues (pt suspects autism) His refusal to work on change is causing problems in the marriage/also causing excessive worry Reviewed stressors/ coping techniques/symptoms/ support sources/ tx options and side effects in detail today  Pt is on trazadone Suggested CBT (for her alone if he will not go) Enc exercise and talking to friends/family as good coping techniques She will also speak with her husband's physician       Other Visit Diagnoses    Need for influenza vaccination        Relevant Orders    Flu Vaccine QUAD 36+ mos PF IM (Fluarix & Fluzone Quad PF) (Completed)

## 2015-09-17 NOTE — Assessment & Plan Note (Signed)
Continue trazadone Reviewed stressors/ coping techniques/symptoms/ support sources/ tx options and side effects in detail today - worse issues with stressors lately  Offered counseling ref for this and disc coping skills in length

## 2015-09-17 NOTE — Patient Instructions (Signed)
Flu shot today  Follow up with your gyn as planned  For stress reaction - do work on the stressor - ie: talking to your husband's doctor or getting some of your friends to help you get him some help  Keep walking and talking to friends  Please let me know if you want to see a counselor   Continue trazadone at current dose   Eat a healthy diet for cholesterol (Avoid red meat/ fried foods/ egg yolks/ fatty breakfast meats/ butter, cheese and high fat dairy/ and shellfish)  Lab today for cholesterol

## 2015-09-17 NOTE — Progress Notes (Signed)
Pre visit review using our clinic review tool, if applicable. No additional management support is needed unless otherwise documented below in the visit note. 

## 2015-09-17 NOTE — Assessment & Plan Note (Signed)
Long disc regarding stressors- primarily her husband having cognition problems and poss other issues (pt suspects autism) His refusal to work on change is causing problems in the marriage/also causing excessive worry Reviewed stressors/ coping techniques/symptoms/ support sources/ tx options and side effects in detail today  Pt is on trazadone Suggested CBT (for her alone if he will not go) Enc exercise and talking to friends/family as good coping techniques She will also speak with her husband's physician

## 2015-09-17 NOTE — Assessment & Plan Note (Signed)
Lipid panel today Disc goals for lipids and reasons to control them Rev labs with pt-from prev labs  Rev low sat fat diet in detail

## 2015-10-08 ENCOUNTER — Ambulatory Visit (INDEPENDENT_AMBULATORY_CARE_PROVIDER_SITE_OTHER): Payer: Medicare Other | Admitting: Family Medicine

## 2015-10-08 ENCOUNTER — Encounter: Payer: Self-pay | Admitting: Family Medicine

## 2015-10-08 ENCOUNTER — Other Ambulatory Visit (HOSPITAL_COMMUNITY): Payer: Self-pay | Admitting: Cardiology

## 2015-10-08 VITALS — BP 112/76 | HR 79 | Temp 98.5°F | Ht 64.0 in | Wt 165.5 lb

## 2015-10-08 DIAGNOSIS — I208 Other forms of angina pectoris: Secondary | ICD-10-CM | POA: Diagnosis not present

## 2015-10-08 DIAGNOSIS — N390 Urinary tract infection, site not specified: Secondary | ICD-10-CM | POA: Insufficient documentation

## 2015-10-08 DIAGNOSIS — N3000 Acute cystitis without hematuria: Secondary | ICD-10-CM | POA: Diagnosis not present

## 2015-10-08 LAB — POCT URINALYSIS DIPSTICK
Bilirubin, UA: NEGATIVE
Blood, UA: NEGATIVE
GLUCOSE UA: NEGATIVE
KETONES UA: NEGATIVE
Nitrite, UA: POSITIVE
PROTEIN UA: NEGATIVE
SPEC GRAV UA: 1.01
Urobilinogen, UA: 0.2
pH, UA: 6.5

## 2015-10-08 MED ORDER — CEPHALEXIN 500 MG PO CAPS: 500.0000 mg | ORAL_CAPSULE | Freq: Two times a day (BID) | ORAL | Status: DC

## 2015-10-08 NOTE — Progress Notes (Signed)
Pre visit review using our clinic review tool, if applicable. No additional management support is needed unless otherwise documented below in the visit note. 

## 2015-10-08 NOTE — Progress Notes (Signed)
Subjective:  Patient ID: Mary Gordon, female    DOB: [birth year]  Age: 69 y.o. MRN: [id number]  CC: ? UTI  HPI:  69 year old female presents to clinic today for an acute visit with complaints of possible UTI.  She states that 4-5 days ago she developed foul-smelling urine. She denies any urgency or frequency. She also denies any dysuria. She does states that she's had low back pain as well as upper back pain. No fevers or chills. No exacerbating or relieving factors.  Social Hx   Social History   Social History  . Marital Status: Married    Spouse Name: N/A  . Number of Children: N/A  . Years of Education: N/A   Occupational History  . Retired from book keeping    Social History Main Topics  . Smoking status: Former Smoker -- 1.00 packs/day for 17 years    Types: Cigarettes    Quit date: 12/08/1978  . Smokeless tobacco: Never Used  . Alcohol Use: No  . Drug Use: No  . Sexual Activity:    Partners: Male   Other Topics Concern  . None   Social History Narrative   Married (husband is colon cancer survivor)   Does not get regular exercise   G4P1    Review of Systems  Constitutional: Negative for fever.  Genitourinary: Negative for dysuria, urgency and frequency.       Foul smelling urine.   Objective:  BP 112/76 mmHg  Pulse 79  Temp(Src) 98.5 F (36.9 C) (Oral)  Ht 5\' 4"  (1.626 m)  Wt 165 lb 8 oz (75.07 kg)  BMI 28.39 kg/m2  SpO2 97%  BP/Weight 10/08/2015 09/17/2015 63/07/7563  Systolic BP 332 951 884  Diastolic BP 76 68 57  Wt. (Lbs) 165.5 165.75 -  BMI 28.39 28.44 -    Physical Exam  Constitutional: She appears well-developed. No distress.  Cardiovascular: Normal rate and regular rhythm.   Pulmonary/Chest: Effort normal. She has no wheezes. She has no rales.  Abdominal: Soft. She exhibits no distension. There is no tenderness.  Neurological: She is alert.  Psychiatric: She has a normal mood and affect.  Vitals reviewed.  Results for orders  placed or performed in visit on 10/08/15 (from the past 24 hour(s))  POCT Urinalysis Dipstick     Status: Abnormal   Collection Time: 10/08/15 11:56 AM  Result Value Ref Range   Color, UA yellow    Clarity, UA clear    Glucose, UA neg    Bilirubin, UA neg    Ketones, UA neg    Spec Grav, UA 1.010    Blood, UA neg    pH, UA 6.5    Protein, UA neg    Urobilinogen, UA 0.2    Nitrite, UA pos    Leukocytes, UA small (1+) (A) Negative   Assessment & Plan:   Problem List Items Addressed This Visit    UTI (urinary tract infection) - Primary    Patient's history not convincing but urine with positive nitrites. I discussed awaiting culture vs empiric treatment. Patient desired empiric treatment. Treating with Keflex.      Relevant Medications   cephALEXin (KEFLEX) 500 MG capsule   Other Relevant Orders   POCT Urinalysis Dipstick (Completed)   Urine culture     Meds ordered this encounter  Medications  . cephALEXin (KEFLEX) 500 MG capsule    Sig: Take 1 capsule (500 mg total) by mouth 2 (two) times daily.    Dispense:  14 capsule    Refill:  0    Follow-up: PRN  Thersa Salt, DO

## 2015-10-08 NOTE — Assessment & Plan Note (Signed)
Patient's history not convincing but urine with positive nitrites. I discussed awaiting culture vs empiric treatment. Patient desired empiric treatment. Treating with Keflex.

## 2015-10-08 NOTE — Patient Instructions (Signed)
Take the antibiotic as prescribed.  Follow up closely with your PCP.  Take care  Dr. Lacinda Axon

## 2015-10-10 LAB — URINE CULTURE

## 2015-10-23 ENCOUNTER — Other Ambulatory Visit: Payer: Self-pay

## 2015-10-23 DIAGNOSIS — Z1231 Encounter for screening mammogram for malignant neoplasm of breast: Secondary | ICD-10-CM

## 2015-11-06 ENCOUNTER — Ambulatory Visit
Admission: RE | Admit: 2015-11-06 | Discharge: 2015-11-06 | Disposition: A | Discharge status: Home / Self Care | Payer: Medicare Other | Source: Ambulatory Visit

## 2015-11-06 DIAGNOSIS — Z1231 Encounter for screening mammogram for malignant neoplasm of breast: Secondary | ICD-10-CM

## 2015-11-09 ENCOUNTER — Encounter: Payer: Self-pay | Admitting: Obstetrics & Gynecology

## 2015-11-09 ENCOUNTER — Ambulatory Visit (INDEPENDENT_AMBULATORY_CARE_PROVIDER_SITE_OTHER): Payer: Medicare Other | Admitting: Obstetrics & Gynecology

## 2015-11-09 VITALS — BP 112/71 | HR 81 | Ht 64.0 in | Wt 158.0 lb

## 2015-11-09 DIAGNOSIS — Z01419 Encounter for gynecological examination (general) (routine) without abnormal findings: Secondary | ICD-10-CM

## 2015-11-09 DIAGNOSIS — M810 Age-related osteoporosis without current pathological fracture: Secondary | ICD-10-CM

## 2015-11-09 DIAGNOSIS — M8580 Other specified disorders of bone density and structure, unspecified site: Secondary | ICD-10-CM

## 2015-11-09 DIAGNOSIS — Z13820 Encounter for screening for osteoporosis: Secondary | ICD-10-CM

## 2015-11-09 DIAGNOSIS — Z0000 Encounter for general adult medical examination without abnormal findings: Secondary | ICD-10-CM

## 2015-11-09 DIAGNOSIS — M859 Disorder of bone density and structure, unspecified: Secondary | ICD-10-CM

## 2015-11-09 NOTE — Progress Notes (Signed)
Subjective:    Mary Gordon is a 69 y.o. MW P1 (70 yo son, 2 grands) - female who presents for an annual exam. The patient has no complaints today. The patient is sexually active. GYN screening history: last pap: was normal. The patient wears seatbelts: yes. The patient participates in regular exercise: yes. Has the patient ever been transfused or tattooed?: no. The patient reports that there is not domestic violence in her life.   Menstrual History: OB History    Gravida Para Term Preterm AB TAB SAB Ectopic Multiple Living   4 1              Menarche age: 55  No LMP recorded. Patient has had a hysterectomy.    The following portions of the patient's history were reviewed and updated as appropriate: allergies, current medications, past family history, past medical history, past social history, past surgical history and problem list.  Review of Systems Pertinent items noted in HPI and remainder of comprehensive ROS otherwise negative. Married for 32 years. Denies dyspareunia, uses a lubricant. Mammogram UTD, She has had her flu vaccine this season.   Objective:    BP 112/71 mmHg  Pulse 81  Ht 5\' 4"  (1.626 m)  Wt 158 lb (71.668 kg)  BMI 27.11 kg/m2  General Appearance:    Alert, cooperative, no distress, appears stated age  Head:    Normocephalic, without obvious abnormality, atraumatic  Eyes:    PERRL, conjunctiva/corneas clear, EOM's intact, fundi    benign, both eyes  Ears:    Normal TM's and external ear canals, both ears  Nose:   Nares normal, septum midline, mucosa normal, no drainage    or sinus tenderness  Throat:   Lips, mucosa, and tongue normal; teeth and gums normal  Neck:   Supple, symmetrical, trachea midline, no adenopathy;    thyroid:  no enlargement/tenderness/nodules; no carotid   bruit or JVD  Back:     Symmetric, no curvature, ROM normal, no CVA tenderness  Lungs:     Clear to auscultation bilaterally, respirations unlabored  Chest Wall:    No tenderness or  deformity   Heart:    Regular rate and rhythm, S1 and S2 normal, no murmur, rub   or gallop  Breast Exam:    No tenderness, masses, or nipple abnormality  Abdomen:     Soft, non-tender, bowel sounds active all four quadrants,    no masses, no organomegaly  Genitalia:    Normal female without lesion, discharge or tenderness, normal bimanual exam. Adnexae non-palpable     Extremities:   Extremities normal, atraumatic, no cyanosis or edema  Pulses:   2+ and symmetric all extremities  Skin:   Skin color, texture, turgor normal, no rashes or lesions  Lymph nodes:   Cervical, supraclavicular, and axillary nodes normal  Neurologic:   CNII-XII intact, normal strength, sensation and reflexes    throughout  .    Assessment:    Healthy female exam.    Plan:     Breast self exam technique reviewed and patient encouraged to perform self-exam monthly. DEXA

## 2015-12-04 ENCOUNTER — Other Ambulatory Visit (HOSPITAL_COMMUNITY): Payer: Self-pay | Admitting: *Deleted

## 2015-12-04 MED ORDER — ISOSORBIDE MONONITRATE ER 60 MG PO TB24: ORAL_TABLET | ORAL | Status: DC

## 2015-12-19 ENCOUNTER — Ambulatory Visit
Admission: RE | Admit: 2015-12-19 | Discharge: 2015-12-19 | Disposition: A | Discharge status: Home / Self Care | Payer: PPO | Source: Ambulatory Visit | Attending: Obstetrics & Gynecology | Admitting: Obstetrics & Gynecology

## 2015-12-19 DIAGNOSIS — M810 Age-related osteoporosis without current pathological fracture: Secondary | ICD-10-CM

## 2015-12-19 DIAGNOSIS — E2839 Other primary ovarian failure: Secondary | ICD-10-CM | POA: Diagnosis not present

## 2016-01-14 ENCOUNTER — Other Ambulatory Visit (HOSPITAL_COMMUNITY): Payer: Self-pay | Admitting: *Deleted

## 2016-01-14 MED ORDER — METOPROLOL SUCCINATE ER 25 MG PO TB24: 12.5000 mg | ORAL_TABLET | Freq: Every day | ORAL | Status: DC

## 2016-01-16 ENCOUNTER — Telehealth: Payer: Self-pay | Admitting: *Deleted

## 2016-01-16 DIAGNOSIS — E8940 Asymptomatic postprocedural ovarian failure: Secondary | ICD-10-CM

## 2016-01-16 DIAGNOSIS — Z79890 Hormone replacement therapy (postmenopausal): Principal | ICD-10-CM

## 2016-01-16 MED ORDER — ESTRADIOL 1 MG PO TABS: 1.0000 mg | ORAL_TABLET | Freq: Every day | ORAL | Status: DC

## 2016-01-16 NOTE — Telephone Encounter (Signed)
Received rx refill request for Estradiol 1 mg, new prescription sent to pharmacy per Dr Hulan Fray order.

## 2016-02-04 DIAGNOSIS — H2513 Age-related nuclear cataract, bilateral: Secondary | ICD-10-CM | POA: Diagnosis not present

## 2016-03-28 ENCOUNTER — Telehealth: Payer: Self-pay | Admitting: Family Medicine

## 2016-03-28 NOTE — Telephone Encounter (Signed)
LM for pt to sch AWV, mn ° °

## 2016-06-18 ENCOUNTER — Encounter: Payer: Self-pay | Admitting: Internal Medicine

## 2016-06-18 ENCOUNTER — Ambulatory Visit (INDEPENDENT_AMBULATORY_CARE_PROVIDER_SITE_OTHER): Payer: PPO | Admitting: Internal Medicine

## 2016-06-18 VITALS — BP 114/74 | HR 81 | Temp 97.9°F | Wt 158.8 lb

## 2016-06-18 DIAGNOSIS — F411 Generalized anxiety disorder: Secondary | ICD-10-CM | POA: Diagnosis not present

## 2016-06-18 MED ORDER — SERTRALINE HCL 50 MG PO TABS: ORAL_TABLET | ORAL | Status: DC

## 2016-06-18 NOTE — Progress Notes (Signed)
Subjective:    Patient ID: Mary Gordon, female    DOB: [birth year], 70 y.o.   MRN: [id number]  HPI  Pt presents to the clinic today with c/o increased anxiety. She reports this has gotten worse over the last 2-3 months. She reports her husband is driving her crazy. She denies worsening depression or difficulty sleeping, just feeling more anxious and on edge. She denies SI/HI. She was on Zoloft in the past but was able to eventually come off of it. She is currently being treated with Trazadone for insomnia and reports it works well..  Review of Systems      Past Medical History  Diagnosis Date  . Palpitations   . High cholesterol   . Panic disorder   . Depression   . Osteopenia   . Vitamin D deficiency   . History of Clostridium difficile infection     In past from augmentin  . Pulmonary nodule     Being observed  . Allergy     all year  . Cervical cancer ([place]) 1978  . Cataract   . Anxiety   . GERD (gastroesophageal reflux disease)   . Fibromyalgia   . Bursitis of hip   . Pulmonary HTN ([place]) 10/20/2014    moderate with PASP 21mmHg    Current Outpatient Prescriptions  Medication Sig Dispense Refill  . Cetirizine HCl (ZYRTEC ALLERGY PO) Take 1 tablet by mouth daily.    . chlorpheniramine (CHLOR-TRIMETON) 4 MG tablet Take 8 mg by mouth 2 (two) times daily as needed for allergies.    Marland Kitchen estradiol (ESTRACE) 1 MG tablet Take 1 tablet (1 mg total) by mouth daily. 100 tablet 4  . isosorbide mononitrate (IMDUR) 60 MG 24 hr tablet TAKE 1 TABLET (60 MG TOTAL) BY MOUTH DAILY. 90 tablet 3  . metoprolol succinate (TOPROL-XL) 25 MG 24 hr tablet Take 0.5 tablets (12.5 mg total) by mouth daily. 15 tablet 6  . pantoprazole (PROTONIX) 40 MG tablet TAKE 1 TABLET (40 MG TOTAL) BY MOUTH DAILY. 90 tablet 3  . traZODone (DESYREL) 100 MG tablet Take 1 tablet (100 mg total) by mouth at bedtime. 90 tablet 3   No current facility-administered medications for this visit.    Allergies    Allergen Reactions  . Adhesive [Tape] Rash    Burns skins   . Amoxicillin-Pot Clavulanate Other (See Comments)    Cdiff  . Simvastatin     myalgias    Family History  Problem Relation Age of Onset  . Heart attack Mother 25  . Emphysema Mother   . Allergies Mother   . Asthma Mother   . Allergies Father   . Arrhythmia Sister     Atrial fibrillation  . Cancer Sister     breast  . Diabetes Other     Parent  . Hyperlipidemia Other     Other relative  . Hypertension Other     Parent, other relative  . Glaucoma Father   . Breast cancer Other     Other relative, sister  . Allergies Other     Siblings  . Asthma Other     Sisters  . Emphysema Sister   . Heart disease Sister   . Rheumatologic disease Sister   . Lung cancer Sister   . Colon cancer Neg Hx   . Esophageal cancer Neg Hx   . Rectal cancer Neg Hx   . Stomach cancer Neg Hx     Social History   Social  History  . Marital Status: Married    Spouse Name: N/A  . Number of Children: N/A  . Years of Education: N/A   Occupational History  . Retired from book keeping    Social History Main Topics  . Smoking status: Former Smoker -- 1.00 packs/day for 17 years    Types: Cigarettes    Quit date: 12/08/1978  . Smokeless tobacco: Never Used  . Alcohol Use: No  . Drug Use: No  . Sexual Activity:    Partners: Male   Other Topics Concern  . Not on file   Social History Narrative   Married (husband is colon cancer survivor)   Does not get regular exercise   G4P1     Constitutional: Denies fever, malaise, fatigue, headache or abrupt weight changes.  Neurological: Denies dizziness, difficulty with memory, difficulty with speech or problems with balance and coordination.  Psych: Pt reports worsening anxiety. Denies depression, SI/HI.  No other specific complaints in a complete review of systems (except as listed in HPI above).  Objective:   Physical Exam   BP 114/74 mmHg  Pulse 81  Temp(Src) 97.9 F  (36.6 C) (Oral)  Wt 158 lb 12 oz (72.009 kg)  SpO2 97% Wt Readings from Last 3 Encounters:  06/18/16 158 lb 12 oz (72.009 kg)  11/09/15 158 lb (71.668 kg)  10/08/15 165 lb 8 oz (75.07 kg)    General: Appears her stated age, well developed, well nourished in NAD. Cardiovascular: Normal rate and rhythm. S1,S2 noted.  No murmur, rubs or gallops noted.  Pulmonary/Chest: Normal effort and positive vesicular breath sounds. No respiratory distress. No wheezes, rales or ronchi noted.  Neurological: Alert and oriented.  Psychiatric: Mood and affect normal. Behavior is normal. Judgment and thought content normal.    BMET    Component Value Date/Time   NA 141 06/20/2015 1158   K 4.6 06/20/2015 1158   CL 106 06/20/2015 1158   CO2 29 06/20/2015 1158   GLUCOSE 110* 06/20/2015 1158   BUN <5* 06/20/2015 1158   CREATININE 0.40* 09/11/2015 1030   CALCIUM 9.3 06/20/2015 1158   GFRNONAA 53* 06/20/2015 1158   GFRAA >60 06/20/2015 1158    Lipid Panel     Component Value Date/Time   CHOL 225* 09/17/2015 0931   TRIG 175.0* 09/17/2015 0931   HDL 60.50 09/17/2015 0931   CHOLHDL 4 09/17/2015 0931   VLDL 35.0 09/17/2015 0931   LDLCALC 130* 09/17/2015 0931    CBC    Component Value Date/Time   WBC 6.4 03/16/2015 0633   RBC 4.77 03/16/2015 0633   HGB 13.4 03/16/2015 0633   HCT 41.2 03/16/2015 0633   PLT 251 03/16/2015 0633   MCV 86.4 03/16/2015 0633   MCH 28.1 03/16/2015 0633   MCHC 32.5 03/16/2015 0633   RDW 13.2 03/16/2015 0633   LYMPHSABS 1.6 03/20/2014 0903   MONOABS 0.3 03/20/2014 0903   EOSABS 0.2 03/20/2014 0903   BASOSABS 0.0 03/20/2014 0903    Hgb A1C No results found for: HGBA1C      Assessment & Plan:   Anxiety:  Ongoing but seems worse She was on Zoloft in the past, will restart Red flags discussed  RTC in 6 weeks if no improvement Shaquala Broeker, NP

## 2016-06-18 NOTE — Patient Instructions (Signed)
Call in 6 weeks if no improvement   CGeneralized Anxiety Disorder Generalized anxiety disorder (GAD) is a mental disorder. It interferes with life functions, including relationships, work, and school. GAD is different from normal anxiety, which everyone experiences at some point in their lives in response to specific life events and activities. Normal anxiety actually helps [place] prepare for and get through these life events and activities. Normal anxiety goes away after the event or activity is over.  GAD causes anxiety that is not necessarily related to specific events or activities. It also causes excess anxiety in proportion to specific events or activities. The anxiety associated with GAD is also difficult to control. GAD can vary from mild to severe. People with severe GAD can have intense waves of anxiety with physical symptoms (panic attacks).  SYMPTOMS The anxiety and worry associated with GAD are difficult to control. This anxiety and worry are related to many life events and activities and also occur more days than not for 6 months or longer. People with GAD also have three or more of the following symptoms (one or more in children):  Restlessness.   Fatigue.  Difficulty concentrating.   Irritability.  Muscle tension.  Difficulty sleeping or unsatisfying sleep. DIAGNOSIS GAD is diagnosed through an assessment by your health care provider. Your health care provider will ask you questions aboutyour mood,physical symptoms, and events in your life. Your health care provider may ask you about your medical history and use of alcohol or drugs, including prescription medicines. Your health care provider may also do a physical exam and blood tests. Certain medical conditions and the use of certain substances can cause symptoms similar to those associated with GAD. Your health care provider may refer you to a mental health specialist for further evaluation. TREATMENT The following therapies  are usually used to treat GAD:   Medication. Antidepressant medication usually is prescribed for long-term daily control. Antianxiety medicines may be added in severe cases, especially when panic attacks occur.   Talk therapy (psychotherapy). Certain types of talk therapy can be helpful in treating GAD by providing support, education, and guidance. A form of talk therapy called cognitive behavioral therapy can teach you healthy ways to think about and react to daily life events and activities.  Stress managementtechniques. These include yoga, meditation, and exercise and can be very helpful when they are practiced regularly. A mental health specialist can help determine which treatment is best for you. Some people see improvement with one therapy. However, other people require a combination of therapies.   This information is not intended to replace advice given to you by your health care provider. Make sure you discuss any questions you have with your health care provider.   Document Released: 03/21/2013 Document Revised: 12/15/2014 Document Reviewed: 03/21/2013 Elsevier Interactive Patient Education [employer].

## 2016-06-18 NOTE — Progress Notes (Signed)
Pre visit review using our clinic review tool, if applicable. No additional management support is needed unless otherwise documented below in the visit note. 

## 2016-08-12 ENCOUNTER — Other Ambulatory Visit (HOSPITAL_COMMUNITY): Payer: Self-pay | Admitting: *Deleted

## 2016-08-12 MED ORDER — METOPROLOL SUCCINATE ER 25 MG PO TB24: 12.5000 mg | ORAL_TABLET | Freq: Every day | ORAL | 6 refills | Status: DC

## 2016-08-13 ENCOUNTER — Other Ambulatory Visit: Payer: Self-pay | Admitting: *Deleted

## 2016-08-13 DIAGNOSIS — Z79890 Hormone replacement therapy (postmenopausal): Principal | ICD-10-CM

## 2016-08-13 DIAGNOSIS — E8940 Asymptomatic postprocedural ovarian failure: Secondary | ICD-10-CM

## 2016-08-13 MED ORDER — ESTRADIOL 1 MG PO TABS: 1.0000 mg | ORAL_TABLET | Freq: Every day | ORAL | 4 refills | Status: DC

## 2016-08-17 ENCOUNTER — Other Ambulatory Visit: Payer: Self-pay | Admitting: Internal Medicine

## 2016-08-18 ENCOUNTER — Ambulatory Visit (INDEPENDENT_AMBULATORY_CARE_PROVIDER_SITE_OTHER): Payer: PPO

## 2016-08-18 VITALS — BP 108/68 | HR 62 | Temp 98.0°F | Ht 64.0 in | Wt 163.5 lb

## 2016-08-18 DIAGNOSIS — Z23 Encounter for immunization: Secondary | ICD-10-CM | POA: Diagnosis not present

## 2016-08-18 DIAGNOSIS — Z0000 Encounter for general adult medical examination without abnormal findings: Secondary | ICD-10-CM

## 2016-08-18 NOTE — Telephone Encounter (Signed)
Please refill for a year  

## 2016-08-18 NOTE — Progress Notes (Signed)
PCP notes:   Health maintenance:  Flu vaccine - administered PCV13 - pt wants to take it during CPE Hep C screening - will complete with future labs  Abnormal screenings:   Hearing - failed  Patient concerns:   None  Nurse concerns:  None  Next PCP appt:   08/25/16 @ 1500  I reviewed health advisor's note, was available for consultation, and agree with documentation and plan.  Loura Pardon MD

## 2016-08-18 NOTE — Progress Notes (Signed)
Subjective:   Mary Gordon is a 70 y.o. female who presents for Medicare Annual (Subsequent) preventive examination.  Review of Systems:  N/A Cardiac Risk Factors include: advanced age (>41men, >13 women);dyslipidemia     Objective:     Vitals: BP 108/68 (BP Location: Left Arm, Patient Position: Sitting, Cuff Size: Normal)   Pulse 62   Temp 98 F (36.7 C) (Oral)   Ht 5\' 4"  (1.626 m) Comment: no shoes  Wt 163 lb 8 oz (74.2 kg)   SpO2 99%   BMI 28.06 kg/m   Body mass index is 28.06 kg/m.   Tobacco History  Smoking Status  . Former Smoker  . Packs/day: 1.00  . Years: 17.00  . Types: Cigarettes  . Quit date: 12/08/1978  Smokeless Tobacco  . Never Used     Counseling given: No   Past Medical History:  Diagnosis Date  . Allergy    all year  . Anxiety   . Bursitis of hip   . Cataract   . Cervical cancer ([place]) 1978  . Depression   . Fibromyalgia   . GERD (gastroesophageal reflux disease)   . High cholesterol   . History of Clostridium difficile infection    In past from augmentin  . Osteopenia   . Palpitations   . Panic disorder   . Pulmonary HTN ([place]) 10/20/2014   moderate with PASP 5mmHg  . Pulmonary nodule    Being observed  . Vitamin D deficiency    Past Surgical History:  Procedure Laterality Date  . ABDOMINAL HYSTERECTOMY  1978  . COLONOSCOPY  2002   Normal  . HAND SURGERY  2005   Left hand thrombosis  . NASAL HEMORRHAGE CONTROL  2007  . RIGHT HEART CATHETERIZATION N/A 03/16/2015   Procedure: RIGHT HEART CATH;  Surgeon: Larey Dresser, MD;  Location: [facility] CATH LAB;  Service: Cardiovascular;  Laterality: N/A;  . SHOULDER SURGERY  2008   Right shoulder bone spur   Family History  Problem Relation Age of Onset  . Heart attack Mother 33  . Emphysema Mother   . Allergies Mother   . Asthma Mother   . Allergies Father   . Glaucoma Father   . Arrhythmia Sister     Atrial fibrillation  . Cancer Sister     breast  . Emphysema Sister   .  Heart disease Sister   . Rheumatologic disease Sister   . Lung cancer Sister   . Diabetes Other     Parent  . Hyperlipidemia Other     Other relative  . Hypertension Other     Parent, other relative  . Breast cancer Other     Other relative, sister  . Allergies Other     Siblings  . Asthma Other     Sisters  . Colon cancer Neg Hx   . Esophageal cancer Neg Hx   . Rectal cancer Neg Hx   . Stomach cancer Neg Hx    History  Sexual Activity  . Sexual activity: Yes  . Partners: Male    Outpatient Encounter Prescriptions as of 08/18/2016  Medication Sig  . Cetirizine HCl (ZYRTEC ALLERGY PO) Take 1 tablet by mouth daily.  . chlorpheniramine (CHLOR-TRIMETON) 4 MG tablet Take 8 mg by mouth 2 (two) times daily as needed for allergies.  Marland Kitchen estradiol (ESTRACE) 1 MG tablet Take 1 tablet (1 mg total) by mouth daily.  . isosorbide mononitrate (IMDUR) 60 MG 24 hr tablet TAKE 1 TABLET (  60 MG TOTAL) BY MOUTH DAILY.  . metoprolol succinate (TOPROL-XL) 25 MG 24 hr tablet Take 0.5 tablets (12.5 mg total) by mouth daily.  . pantoprazole (PROTONIX) 40 MG tablet TAKE 1 TABLET (40 MG TOTAL) BY MOUTH DAILY.  Marland Kitchen sertraline (ZOLOFT) 50 MG tablet Take 1/2 tablet daily for 2 weeks, then 1 tablet daily therafter  . traZODone (DESYREL) 100 MG tablet Take 1 tablet (100 mg total) by mouth at bedtime.   No facility-administered encounter medications on file as of 08/18/2016.     Activities of Daily Living In your present state of health, do you have any difficulty performing the following activities: 08/18/2016  Hearing? Y  Vision? N  Difficulty concentrating or making decisions? N  Walking or climbing stairs? N  Dressing or bathing? N  Doing errands, shopping? N  Preparing Food and eating ? N  Using the Toilet? N  In the past six months, have you accidently leaked urine? N  Do you have problems with loss of bowel control? N  Managing your Medications? N  Managing your Finances? N  Housekeeping or  managing your Housekeeping? N  Some recent data might be hidden    Patient Care Team: Abner Greenspan, MD as PCP - General Birder Robson, MD as Referring Physician (Ophthalmology) Larey Dresser, MD as Consulting Physician (Cardiology) Emily Filbert, MD as Consulting Physician (Obstetrics and Gynecology) Melida Quitter, MD as Consulting Physician (Otolaryngology)    Assessment:     Hearing Screening   125Hz  250Hz  500Hz  1000Hz  2000Hz  3000Hz  4000Hz  6000Hz  8000Hz   Right ear:   40 40 40  0    Left ear:   0 0 40  0    Vision Screening Comments: Last vision exam with Dr. Merla Riches in 2017   Exercise Activities and Dietary recommendations Current Exercise Habits: Home exercise routine, Type of exercise: Other - see comments (pickle ball), Time (Minutes): 60, Frequency (Times/Week): 3, Weekly Exercise (Minutes/Week): 180, Intensity: Moderate, Exercise limited by: None identified  Goals    . Increase physical activity          Starting 08/18/2016, I will continue to exercise for at least 60 min 3 days per week.       Fall Risk Fall Risk  08/18/2016 03/24/2014  Falls in the past year? No No   Depression Screen PHQ 2/9 Scores 08/18/2016 03/24/2014  PHQ - 2 Score 0 0     Cognitive Testing MMSE - Mini Mental State Exam 08/18/2016  Orientation to time 5  Orientation to Place 5  Registration 3  Attention/ Calculation 0  Recall 3  Language- name 2 objects 0  Language- repeat 1  Language- follow 3 step command 3  Language- read & follow direction 0  Write a sentence 0  Copy design 0  Total score 20   PLEASE NOTE: A Mini-Cog screen was completed. Maximum score is 20. A value of 0 denotes this part of [place] MMSE was not completed or the patient failed this part of the Mini-Cog screening.   Mini-Cog Screening Orientation to Time - Max 5 pts Orientation to Place - Max 5 pts Registration - Max 3 pts Recall - Max 3 pts Language Repeat - Max 1 pts Language Follow 3 Step Command - Max  3 pts   Immunization History  Administered Date(s) Administered  . Influenza Split 09/26/2011, 10/15/2012  . Influenza Whole 09/04/2010  . Influenza,inj,Quad PF,36+ Mos 09/06/2013, 09/08/2014, 09/17/2015, 08/18/2016  . Pneumococcal Polysaccharide-23 10/07/2008  . Td  08/08/2009  . Zoster 12/08/2008   Screening Tests Health Maintenance  Topic Date Due  . Hepatitis C Screening  08/19/2016 (Originally [birth year])  . PNA vac Low Risk Adult (1 of 2 - PCV13) 08/25/2016 (Originally 09/20/2011)  . MAMMOGRAM  11/05/2017  . TETANUS/TDAP  08/09/2019  . COLONOSCOPY  10/15/2021  . INFLUENZA VACCINE  Completed  . DEXA SCAN  Completed  . ZOSTAVAX  Completed      Plan:     I have personally reviewed and addressed the Medicare Annual Wellness questionnaire and have noted the following in the patient's chart:  A. Medical and social history B. Use of alcohol, tobacco or illicit drugs  C. Current medications and supplements D. Functional ability and status E.  Nutritional status F.  Physical activity G. Advance directives H. List of other physicians I.  Hospitalizations, surgeries, and ER visits in previous 12 months J.  [place] to include hearing, vision, cognitive, depression L. Referrals and appointments - none  In addition, I have reviewed and discussed with patient certain preventive protocols, quality metrics, and best practice recommendations. A written personalized care plan for preventive services as well as general preventive health recommendations were provided to patient.  See attached scanned questionnaire for additional information.   Signed,   Lindell Noe, MHA, BS, LPN Health Advisor

## 2016-08-18 NOTE — Patient Instructions (Signed)
Ms. Bauguess , Thank you for taking time to come for your Medicare Wellness Visit. I appreciate your ongoing commitment to your health goals. Please review the following plan we discussed and let me know if I can assist you in the future.   These are the goals we discussed: Goals    . Increase physical activity          Starting 08/18/2016, I will continue to exercise for at least 60 min 3 days per week.        This is a list of the screening recommended for you and due dates:  Health Maintenance  Topic Date Due  .  Hepatitis C: One time screening is recommended by Center for Disease Control  (CDC) for  adults born from [birth year] through 1965.   08/19/2016*  . Pneumonia vaccines (1 of 2 - PCV13) 08/25/2016*  . Mammogram  11/05/2017  . Tetanus Vaccine  08/09/2019  . Colon Cancer Screening  10/15/2021  . Flu Shot  Completed  . DEXA scan (bone density measurement)  Completed  . Shingles Vaccine  Completed  *Topic was postponed. The date shown is not the original due date.   Preventive Care for Adults  A healthy lifestyle and preventive care can promote health and wellness. Preventive health guidelines for adults include the following key practices.  . A routine yearly physical is a good way to check with your health care provider about your health and preventive screening. It is a chance to share any concerns and updates on your health and to receive a thorough exam.  . Visit your dentist for a routine exam and preventive care every 6 months. Brush your teeth twice a day and floss once a day. Good oral hygiene prevents tooth decay and gum disease.  . The frequency of eye exams is based on your age, health, family medical history, use  of contact lenses, and other factors. Follow your health care provider's ecommendations for frequency of eye exams.  . Eat a healthy diet. Foods like vegetables, fruits, whole grains, low-fat dairy products, and lean protein foods contain the nutrients you need  without too many calories. Decrease your intake of foods high in solid fats, added sugars, and salt. Eat the right amount of calories for you. Get information about a proper diet from your health care provider, if necessary.  . Regular physical exercise is one of the most important things you can do for your health. Most adults should get at least 150 minutes of moderate-intensity exercise (any activity that increases your heart rate and causes you to sweat) each week. In addition, most adults need muscle-strengthening exercises on 2 or more days a week.  Silver Sneakers may be a benefit available to you. To determine eligibility, you may visit the website: www.silversneakers.com or contact program at (360) 850-0696 Mon-Fri between 8AM-8PM.   . Maintain a healthy weight. The body mass index (BMI) is a screening tool to identify possible weight problems. It provides an estimate of body fat based on height and weight. Your health care provider can find your BMI and can help you achieve or maintain a healthy weight.   For adults 20 years and older: ? A BMI below 18.5 is considered underweight. ? A BMI of 18.5 to 24.9 is normal. ? A BMI of 25 to 29.9 is considered overweight. ? A BMI of 30 and above is considered obese.   . Maintain normal blood lipids and cholesterol levels by exercising and minimizing your intake  of saturated fat. Eat a balanced diet with plenty of fruit and vegetables. Blood tests for lipids and cholesterol should begin at age 68 and be repeated every 5 years. If your lipid or cholesterol levels are high, you are over 50, or you are at high risk for heart disease, you may need your cholesterol levels checked more frequently. Ongoing high lipid and cholesterol levels should be treated with medicines if diet and exercise are not working.  . If you smoke, find out from your health care provider how to quit. If you do not use tobacco, please do not start.  . If you choose to drink  alcohol, please do not consume more than 2 drinks per day. One drink is considered to be 12 ounces (355 mL) of beer, 5 ounces (148 mL) of wine, or 1.5 ounces (44 mL) of liquor.  . If you are 37-16 years old, ask your health care provider if you should take aspirin to prevent strokes.  . Use sunscreen. Apply sunscreen liberally and repeatedly throughout the day. You should seek shade when your shadow is shorter than you. Protect yourself by wearing long sleeves, pants, a wide-brimmed hat, and sunglasses year round, whenever you are outdoors.  . Once a month, do a whole body skin exam, using a mirror to look at the skin on your back. Tell your health care provider of new moles, moles that have irregular borders, moles that are larger than a pencil eraser, or moles that have changed in shape or color.

## 2016-08-18 NOTE — Progress Notes (Signed)
Pre visit review using our clinic review tool, if applicable. No additional management support is needed unless otherwise documented below in the visit note. 

## 2016-08-18 NOTE — Telephone Encounter (Signed)
Last filled by [facility] 06/2016 w/ 2 refills--please advise if okay to refill

## 2016-08-19 ENCOUNTER — Other Ambulatory Visit (INDEPENDENT_AMBULATORY_CARE_PROVIDER_SITE_OTHER): Payer: PPO

## 2016-08-19 ENCOUNTER — Telehealth: Payer: Self-pay | Admitting: Family Medicine

## 2016-08-19 DIAGNOSIS — Z1159 Encounter for screening for other viral diseases: Secondary | ICD-10-CM

## 2016-08-19 DIAGNOSIS — Z0000 Encounter for general adult medical examination without abnormal findings: Secondary | ICD-10-CM

## 2016-08-19 LAB — CBC WITH DIFFERENTIAL/PLATELET
BASOS ABS: 0 10*3/uL (ref 0.0–0.1)
Basophils Relative: 0.4 % (ref 0.0–3.0)
EOS PCT: 3.8 % (ref 0.0–5.0)
Eosinophils Absolute: 0.2 10*3/uL (ref 0.0–0.7)
HEMATOCRIT: 36.5 % (ref 36.0–46.0)
Hemoglobin: 12.3 g/dL (ref 12.0–15.0)
LYMPHS PCT: 25.5 % (ref 12.0–46.0)
Lymphs Abs: 1.5 10*3/uL (ref 0.7–4.0)
MCHC: 33.6 g/dL (ref 30.0–36.0)
MCV: 85 fl (ref 78.0–100.0)
MONOS PCT: 6.3 % (ref 3.0–12.0)
Monocytes Absolute: 0.4 10*3/uL (ref 0.1–1.0)
NEUTROS ABS: 3.7 10*3/uL (ref 1.4–7.7)
NEUTROS PCT: 64 % (ref 43.0–77.0)
PLATELETS: 259 10*3/uL (ref 150.0–400.0)
RBC: 4.29 Mil/uL (ref 3.87–5.11)
RDW: 13.2 % (ref 11.5–15.5)
WBC: 5.8 10*3/uL (ref 4.0–10.5)

## 2016-08-19 LAB — LIPID PANEL
CHOL/HDL RATIO: 4
Cholesterol: 208 mg/dL — ABNORMAL HIGH (ref 0–200)
HDL: 54.2 mg/dL (ref >39.00)
LDL Cholesterol: 130 mg/dL — ABNORMAL HIGH (ref 0–99)
NONHDL: 153.63
Triglycerides: 119 mg/dL (ref 0.0–149.0)
VLDL: 23.8 mg/dL (ref 0.0–40.0)

## 2016-08-19 LAB — COMPREHENSIVE METABOLIC PANEL
ALT: 13 U/L (ref 0–35)
AST: 16 U/L (ref 0–37)
Albumin: 3.8 g/dL (ref 3.5–5.2)
Alkaline Phosphatase: 69 U/L (ref 39–117)
BILIRUBIN TOTAL: 0.7 mg/dL (ref 0.2–1.2)
BUN: 9 mg/dL (ref 6–23)
CALCIUM: 8.8 mg/dL (ref 8.4–10.5)
CO2: 32 mEq/L (ref 19–32)
Chloride: 104 mEq/L (ref 96–112)
Creatinine, Ser: 0.89 mg/dL (ref 0.40–1.20)
GFR: 66.66 mL/min (ref >60.00)
GLUCOSE: 106 mg/dL — AB (ref 70–99)
POTASSIUM: 4.5 meq/L (ref 3.5–5.1)
Sodium: 137 mEq/L (ref 135–145)
Total Protein: 6.4 g/dL (ref 6.0–8.3)

## 2016-08-19 LAB — TSH: TSH: 1.64 u[IU]/mL (ref 0.35–4.50)

## 2016-08-19 NOTE — Telephone Encounter (Signed)
-----   Message from Eustace Pen, LPN sent at [phone]  3:32 PM EDT ----- Regarding: CPE labs Dr. Glori Bickers,  Please add Hep C and lipid panel (per pt's request) to CPE labs.  Lab ladies, this is an add-on for tomorrow.   Thanks, Katha Cabal :)

## 2016-08-20 LAB — HEPATITIS C ANTIBODY: HCV Ab: NEGATIVE

## 2016-08-25 ENCOUNTER — Ambulatory Visit (INDEPENDENT_AMBULATORY_CARE_PROVIDER_SITE_OTHER): Payer: PPO | Admitting: Family Medicine

## 2016-08-25 ENCOUNTER — Encounter: Payer: Self-pay | Admitting: Family Medicine

## 2016-08-25 VITALS — BP 110/60 | HR 82 | Temp 98.0°F | Ht 64.0 in | Wt 162.5 lb

## 2016-08-25 DIAGNOSIS — R7303 Prediabetes: Secondary | ICD-10-CM | POA: Insufficient documentation

## 2016-08-25 DIAGNOSIS — E7800 Pure hypercholesterolemia, unspecified: Secondary | ICD-10-CM | POA: Diagnosis not present

## 2016-08-25 DIAGNOSIS — M8580 Other specified disorders of bone density and structure, unspecified site: Secondary | ICD-10-CM | POA: Diagnosis not present

## 2016-08-25 DIAGNOSIS — I272 Other secondary pulmonary hypertension: Secondary | ICD-10-CM

## 2016-08-25 DIAGNOSIS — Z0000 Encounter for general adult medical examination without abnormal findings: Secondary | ICD-10-CM

## 2016-08-25 DIAGNOSIS — Z23 Encounter for immunization: Secondary | ICD-10-CM

## 2016-08-25 DIAGNOSIS — R739 Hyperglycemia, unspecified: Secondary | ICD-10-CM

## 2016-08-25 DIAGNOSIS — L719 Rosacea, unspecified: Secondary | ICD-10-CM

## 2016-08-25 DIAGNOSIS — Z1159 Encounter for screening for other viral diseases: Secondary | ICD-10-CM

## 2016-08-25 MED ORDER — METRONIDAZOLE 0.75 % EX CREA: TOPICAL_CREAM | Freq: Two times a day (BID) | CUTANEOUS | 3 refills | Status: DC

## 2016-08-25 MED ORDER — PNEUMOCOCCAL 13-VAL CONJ VACC IM SUSP
0.5000 mL | Freq: Once | INTRAMUSCULAR | Status: AC
  Administered 2016-08-25: 0.5 mL via INTRAMUSCULAR

## 2016-08-25 NOTE — Assessment & Plan Note (Signed)
Hep C neg with labs today

## 2016-08-25 NOTE — Assessment & Plan Note (Signed)
Mild openia In femoral neck 1/17 Disc need for calcium/ vitamin D/ wt bearing exercise and bone density test every 2 y to monitor Disc safety/ fracture risk in detail  On PPI No falls or fx  On estrace Continue to manage with gyn

## 2016-08-25 NOTE — Progress Notes (Signed)
Pre visit review using our clinic review tool, if applicable. No additional management support is needed unless otherwise documented below in the visit note. 

## 2016-08-25 NOTE — Assessment & Plan Note (Signed)
Glucose 106 fasting  Disc reducing sweets and carbs  Will check A1C at next labs  No diabetic symptoms

## 2016-08-25 NOTE — Patient Instructions (Addendum)
Don't forget to schedule your annual mammogram for Nov  For cholesterol ;    Avoid red meat/ fried foods/ egg yolks/ fatty breakfast meats/ butter, cheese and high fat dairy/ and shellfish   Watch carbs and sugar for hyperglycemia prevnar vaccine today   Schedule a follow up to discuss your neck/pinched nerve

## 2016-08-25 NOTE — Assessment & Plan Note (Signed)
Pt is asymptomatic at this time.

## 2016-08-25 NOTE — Assessment & Plan Note (Signed)
Refilled metro cream today  Disc imp of sun protection

## 2016-08-25 NOTE — Assessment & Plan Note (Signed)
Disc goals for lipids and reasons to control them Rev labs with pt Rev low sat fat diet in detail Disc goals for LDL and HDL  Triglycerides are improved  Intolerant of statin/ myalgias with zocor in the past

## 2016-08-25 NOTE — Assessment & Plan Note (Signed)
Reviewed health habits including diet and exercise and skin cancer prevention Reviewed appropriate screening tests for age  Also reviewed health mt list, fam hx and immunization status , as well as social and family history   See HPI Labs reviewed  Reviewed AM[facility] visit Don't forget to schedule your annual mammogram for Nov  For cholesterol ;    Avoid red meat/ fried foods/ egg yolks/ fatty breakfast meats/ butter, cheese and high fat dairy/ and shellfish   Watch carbs and sugar for hyperglycemia prevnar vaccine today   Schedule a follow up to discuss your neck/pinched nerve

## 2016-08-25 NOTE — Progress Notes (Signed)
Subjective:    Patient ID: Mary Gordon, female    DOB: 09-18-1946, 70 y.o.   MRN: [id number]  HPI Here for health maintenance exam and to review chronic medical problems    Doing well  Had a busy year  Traveling /had her grand children   She has been dealing with a pinched nerve in her neck  Used heat and TENS unit Gives her some numbness in her L hand  Muscle has relaxed some  Has not seen a specialist  Has not had xrays   Needs refill of metronizadone for rosacea    Wt Readings from Last 3 Encounters:  08/25/16 162 lb 8 oz (73.7 kg)  08/18/16 163 lb 8 oz (74.2 kg)  06/18/16 158 lb 12 oz (72 kg)  down a few lb - she wants to loose weight  She cannot stand her weight  Really watches her diet- has a weakness for sugar and fights that  Exercises regularly - she plays pickle ball also 3 times per week  bmi is 27.8  Had AMW earlier this month Had her flu shot   Due for PCV13 Hep C ordered Failed her hearing eval - on L ear (pt states she was test several years ago) Not bothersome    Mammogram 11/16- neg  Self breast exam -no lumps or changes   Tetanus shot 9/10  Pap 2013 with gyn-normal Had visit with Dr Hulan Fray Dec 2016  Had cervical cancer with hyst in 1978  Colonoscopy 11/12- 10 year recall - declines cologuard at this time    dexa 1/17-mild osteopenia of fem neck  Takes ca and D Exercises  On PPI  No falls or fx  On estrace - just cut her dose in 1/2 Ordered by Dr Hulan Fray  Hx of hyperlipidemia  Lab Results  Component Value Date   CHOL 208 (H) 08/19/2016   CHOL 225 (H) 09/17/2015   CHOL 239 (H) 09/26/2014   Lab Results  Component Value Date   HDL 54.20 08/19/2016   HDL 60.50 09/17/2015   HDL 67.00 09/26/2014   Lab Results  Component Value Date   LDLCALC 130 (H) 08/19/2016   LDLCALC 130 (H) 09/17/2015   [place] 147 (H) 09/26/2014   Lab Results  Component Value Date   TRIG 119.0 08/19/2016   TRIG 175.0 (H) 09/17/2015   TRIG 124.0  09/26/2014   Lab Results  Component Value Date   CHOLHDL 4 08/19/2016   CHOLHDL 4 09/17/2015   CHOLHDL 4 09/26/2014   Lab Results  Component Value Date   LDLDIRECT 161.5 10/24/2013   LDLDIRECT 153.0 10/15/2012    Improved triglycerides  HDL is good   Results for orders placed or performed in visit on 08/19/16  Comprehensive metabolic panel  Result Value Ref Range   Sodium 137 135 - 145 mEq/L   Potassium 4.5 3.5 - 5.1 mEq/L   Chloride 104 96 - 112 mEq/L   CO2 32 19 - 32 mEq/L   Glucose, Bld 106 (H) 70 - 99 mg/dL   BUN 9 6 - 23 mg/dL   Creatinine, Ser 0.89 0.40 - 1.20 mg/dL   Total Bilirubin 0.7 0.2 - 1.2 mg/dL   Alkaline Phosphatase 69 39 - 117 U/L   AST 16 0 - 37 U/L   ALT 13 0 - 35 U/L   Total Protein 6.4 6.0 - 8.3 g/dL   Albumin 3.8 3.5 - 5.2 g/dL   Calcium 8.8 8.4 - 10.5 mg/dL  GFR 66.66 >60.00 mL/min  CBC with Differential/Platelet  Result Value Ref Range   WBC 5.8 4.0 - 10.5 K/uL   RBC 4.29 3.87 - 5.11 Mil/uL   Hemoglobin 12.3 12.0 - 15.0 g/dL   HCT 36.5 36.0 - 46.0 %   MCV 85.0 78.0 - 100.0 fl   MCHC 33.6 30.0 - 36.0 g/dL   RDW 13.2 11.5 - 15.5 %   Platelets 259.0 150.0 - 400.0 K/uL   Neutrophils Relative % 64.0 43.0 - 77.0 %   Lymphocytes Relative 25.5 12.0 - 46.0 %   Monocytes Relative 6.3 3.0 - 12.0 %   Eosinophils Relative 3.8 0.0 - 5.0 %   Basophils Relative 0.4 0.0 - 3.0 %   Neutro Abs 3.7 1.4 - 7.7 K/uL   Lymphs Abs 1.5 0.7 - 4.0 K/uL   Monocytes Absolute 0.4 0.1 - 1.0 K/uL   Eosinophils Absolute 0.2 0.0 - 0.7 K/uL   Basophils Absolute 0.0 0.0 - 0.1 K/uL  Lipid panel  Result Value Ref Range   Cholesterol 208 (H) 0 - 200 mg/dL   Triglycerides 119.0 0.0 - 149.0 mg/dL   HDL 54.20 >39.00 mg/dL   VLDL 23.8 0.0 - 40.0 mg/dL   LDL Cholesterol 130 (H) 0 - 99 mg/dL   Total CHOL/HDL Ratio 4    NonHDL 153.63   TSH  Result Value Ref Range   TSH 1.64 0.35 - 4.50 uIU/mL  Hepatitis C antibody  Result Value Ref Range   HCV Ab NEGATIVE NEGATIVE      Glucose is 106 fasting - ate sweets the night before    Patient Active Problem List   Diagnosis Date Noted  . Hyperglycemia 08/25/2016  . Routine general medical examination at a health care facility 08/19/2016  . Need for hepatitis C screening test 08/19/2016  . Stress reaction 09/17/2015  . 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 Rosacea 12/27/2014  . Pulmonary HTN ([place]) 10/20/2014  . DOE (dyspnea on exertion) 09/26/2014  . PVCs (premature ventricular contractions) 09/26/2014  . Globus sensation 08/18/2014  . Left knee pain 08/18/2014  . Encounter for Medicare annual wellness exam 03/18/2014  . Muscle pain 10/15/2012  . Fatigue 05/31/2012  . Fibromyalgia 05/31/2012  . Neck mass 05/31/2012  . Special screening for malignant neoplasms, colon 09/26/2011  . Hoarseness 07/25/2011  . GERD (gastroesophageal reflux disease) 07/08/2011  . CERVICAL RADICULOPATHY, RIGHT 09/04/2010  . SKIN RASH 04/22/2010  . PULMONARY NODULE 09/14/2009  . PALPITATIONS 08/17/2009  . UNSPECIFIED VITAMIN D DEFICIENCY 02/22/2009  . HYPERCHOLESTEROLEMIA 02/22/2009  . Adjustment disorder with mixed anxiety and depressed mood 02/22/2009  . ALLERGIC RHINITIS 02/22/2009  . MENOPAUSAL SYNDROME 02/22/2009  . Osteopenia 02/22/2009   Past Medical History:  Diagnosis Date  . Allergy    all year  . Anxiety   . Bursitis of hip   . Cataract   . Cervical cancer ([place]) 1978  . Depression   . Fibromyalgia   . GERD (gastroesophageal reflux disease)   . High cholesterol   . History of Clostridium difficile infection    In past from augmentin  . Osteopenia   . Palpitations   . Panic disorder   . Pulmonary HTN ([place]) 10/20/2014   moderate with PASP 36mmHg  . Pulmonary nodule    Being observed  . Vitamin D deficiency    Past Surgical History:  Procedure Laterality Date  . ABDOMINAL HYSTERECTOMY  1978  . COLONOSCOPY  2002   Normal  . HAND SURGERY  2005  Left hand thrombosis  . NASAL HEMORRHAGE CONTROL   2007  . RIGHT HEART CATHETERIZATION N/A 03/16/2015   Procedure: RIGHT HEART CATH;  Surgeon: Larey Dresser, MD;  Location: [facility] CATH LAB;  Service: Cardiovascular;  Laterality: N/A;  . SHOULDER SURGERY  2008   Right shoulder bone spur   Social History  Substance Use Topics  . Smoking status: Former Smoker    Packs/day: 1.00    Years: 17.00    Types: Cigarettes    Quit date: 12/08/1978  . Smokeless tobacco: Never Used  . Alcohol use No   Family History  Problem Relation Age of Onset  . Heart attack Mother 78  . Emphysema Mother   . Allergies Mother   . Asthma Mother   . Allergies Father   . Glaucoma Father   . Arrhythmia Sister     Atrial fibrillation  . Cancer Sister     breast  . Emphysema Sister   . Heart disease Sister   . Rheumatologic disease Sister   . Lung cancer Sister   . Diabetes Other     Parent  . Hyperlipidemia Other     Other relative  . Hypertension Other     Parent, other relative  . Breast cancer Other     Other relative, sister  . Allergies Other     Siblings  . Asthma Other     Sisters  . Colon cancer Neg Hx   . Esophageal cancer Neg Hx   . Rectal cancer Neg Hx   . Stomach cancer Neg Hx    Allergies  Allergen Reactions  . Adhesive [Tape] Rash    Burns skins   . Amoxicillin-Pot Clavulanate Other (See Comments)    Cdiff  . Simvastatin     myalgias   Current Outpatient Prescriptions on File Prior to Visit  Medication Sig Dispense Refill  . Cetirizine HCl (ZYRTEC ALLERGY PO) Take 1 tablet by mouth daily.    . chlorpheniramine (CHLOR-TRIMETON) 4 MG tablet Take 8 mg by mouth 2 (two) times daily as needed for allergies.    Marland Kitchen estradiol (ESTRACE) 1 MG tablet Take 1 tablet (1 mg total) by mouth daily. 100 tablet 4  . isosorbide mononitrate (IMDUR) 60 MG 24 hr tablet TAKE 1 TABLET (60 MG TOTAL) BY MOUTH DAILY. 90 tablet 3  . metoprolol succinate (TOPROL-XL) 25 MG 24 hr tablet Take 0.5 tablets (12.5 mg total) by mouth daily. 45 tablet 6  .  pantoprazole (PROTONIX) 40 MG tablet TAKE 1 TABLET (40 MG TOTAL) BY MOUTH DAILY. 90 tablet 3  . sertraline (ZOLOFT) 50 MG tablet Take 1 tablet (50 mg total) by mouth daily. 30 tablet 11  . traZODone (DESYREL) 100 MG tablet Take 1 tablet (100 mg total) by mouth at bedtime. 90 tablet 3   No current facility-administered medications on file prior to visit.     Review of Systems Review of Systems  Constitutional: Negative for fever, appetite change, fatigue and unexpected weight change.  Eyes: Negative for pain and visual disturbance.  Respiratory: Negative for cough and shortness of breath.   Cardiovascular: Negative for cp or palpitations    Gastrointestinal: Negative for nausea, diarrhea and constipation.  Genitourinary: Negative for urgency and frequency.  Skin: Negative for pallor or rash   MSK pos for neck pain that radiates to her L hand  Neurological: Negative for weakness, light-headedness, numbness and headaches.  Hematological: Negative for adenopathy. Does not bruise/bleed easily.  Psychiatric/Behavioral: Negative for dysphoric mood. The  patient is not nervous/anxious.         Objective:   Physical Exam  Constitutional: She appears well-developed and well-nourished. No distress.  overwt and well appearing   HENT:  Head: Normocephalic and atraumatic.  Right Ear: External ear normal.  Left Ear: External ear normal.  Nose: Nose normal.  Mouth/Throat: Oropharynx is clear and moist.  Eyes: Conjunctivae and EOM are normal. Pupils are equal, round, and reactive to light. Right eye exhibits no discharge. Left eye exhibits no discharge. No scleral icterus.  Neck: Normal range of motion. Neck supple. No JVD present. Carotid bruit is not present. No thyromegaly present.  Cardiovascular: Normal rate, regular rhythm, normal heart sounds and intact distal pulses.  Exam reveals no gallop.   Pulmonary/Chest: Effort normal and breath sounds normal. No respiratory distress. She has no  wheezes. She has no rales.  Abdominal: Soft. Bowel sounds are normal. She exhibits no distension and no mass. There is no tenderness.  Musculoskeletal: She exhibits no edema or tenderness.  Lymphadenopathy:    She has no cervical adenopathy.  Neurological: She is alert. She has normal reflexes. No cranial nerve deficit. She exhibits normal muscle tone. Coordination normal.  Skin: Skin is warm and dry. No rash noted. No erythema. No pallor.  Solar lentigines and SKs diffusely  Psychiatric: She has a normal mood and affect.          Assessment & Plan:   Problem List Items Addressed This Visit      Cardiovascular and Mediastinum   Pulmonary HTN ([place])    Pt is asymptomatic at this time         Musculoskeletal and Integument   Rosacea    Refilled metro cream today  Disc imp of sun protection       Osteopenia    Mild openia In femoral neck 1/17 Disc need for calcium/ vitamin D/ wt bearing exercise and bone density test every 2 y to monitor Disc safety/ fracture risk in detail  On PPI No falls or fx  On estrace Continue to manage with gyn        Other   Routine general medical examination at a health care facility - Primary    Reviewed health habits including diet and exercise and skin cancer prevention Reviewed appropriate screening tests for age  Also reviewed health mt list, fam hx and immunization status , as well as social and family history   See HPI Labs reviewed  Reviewed AMW visit Don't forget to schedule your annual mammogram for Nov  For cholesterol ;    Avoid red meat/ fried foods/ egg yolks/ fatty breakfast meats/ butter, cheese and high fat dairy/ and shellfish   Watch carbs and sugar for hyperglycemia prevnar vaccine today   Schedule a follow up to discuss your neck/pinched nerve      Need for hepatitis C screening test    Hep C neg with labs today      Hyperglycemia    Glucose 106 fasting  Disc reducing sweets and carbs  Will check A1C at next  labs  No diabetic symptoms      HYPERCHOLESTEROLEMIA    Disc goals for lipids and reasons to control them Rev labs with pt Rev low sat fat diet in detail Disc goals for LDL and HDL  Triglycerides are improved  Intolerant of statin/ myalgias with zocor in the past        Other Visit Diagnoses    Need for vaccination with 13-polyvalent  pneumococcal conjugate vaccine       Relevant Medications   pneumococcal 13-valent conjugate vaccine (PREVNAR 13) injection 0.5 mL (Completed)

## 2016-09-01 ENCOUNTER — Ambulatory Visit (INDEPENDENT_AMBULATORY_CARE_PROVIDER_SITE_OTHER): Payer: PPO | Admitting: Family Medicine

## 2016-09-01 ENCOUNTER — Encounter: Payer: Self-pay | Admitting: Family Medicine

## 2016-09-01 ENCOUNTER — Ambulatory Visit (INDEPENDENT_AMBULATORY_CARE_PROVIDER_SITE_OTHER)
Admission: RE | Admit: 2016-09-01 | Discharge: 2016-09-01 | Disposition: A | Discharge status: Home / Self Care | Payer: PPO | Source: Ambulatory Visit | Attending: Family Medicine | Admitting: Family Medicine

## 2016-09-01 VITALS — BP 118/74 | HR 88 | Temp 98.2°F | Ht 64.0 in | Wt 162.2 lb

## 2016-09-01 DIAGNOSIS — M542 Cervicalgia: Secondary | ICD-10-CM

## 2016-09-01 DIAGNOSIS — M47812 Spondylosis without myelopathy or radiculopathy, cervical region: Secondary | ICD-10-CM | POA: Diagnosis not present

## 2016-09-01 MED ORDER — MELOXICAM 15 MG PO TABS: 15.0000 mg | ORAL_TABLET | Freq: Every day | ORAL | 1 refills | Status: DC

## 2016-09-01 MED ORDER — CYCLOBENZAPRINE HCL 10 MG PO TABS: 10.0000 mg | ORAL_TABLET | Freq: Every day | ORAL | 1 refills | Status: DC

## 2016-09-01 NOTE — Assessment & Plan Note (Signed)
With radicular symptoms to L arm  Hx of herniated discs in the past - improved with traction and rehab Flexeril qhs px  mobic 15 mg daily with food  Recommend heat and tens unit as needed CS xray today  Plan to follow result

## 2016-09-01 NOTE — Patient Instructions (Signed)
Cervical xray now  Use TENS and heat on your neck as needed  Use a good pillow  Try meloxicam 15 mg with a meal daily (anti inflammatory)  Flexeril )(for spasm) at night- caution of sedation  Update me if symptoms suddenly suddenly worsen

## 2016-09-01 NOTE — Progress Notes (Signed)
Subjective:    Patient ID: Mary Gordon, female    DOB: 10-13-1946, 70 y.o.   MRN: [id number]  HPI Here for pain and ? Pinched nerve in her neck   2 weeks  Was vaccuming/twisting on her stairs  Her L (trapezius area) muscle cramped up   To help it- she used a TENS unit, warm compress , stretch and massage  It all helped a little to help relax muscle   Symptoms down L arm -started a few days later - all the way to the tip of index finger  occ pain - but typically a numbish sensation but not totally numb ,occ tingling  Her grip may not be as strong when it bothers her  Symptoms wax and wane somewhat   No triggers except reaching up to do her hair   Neck is sore/nothing severe - but tender on the left side   Has a supportive pillow   Hx 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 Neck pain on left side 09/01/2016  . Hyperglycemia 08/25/2016  . Routine general medical examination at a health care facility 08/19/2016  . Need for hepatitis C screening test 08/19/2016  . Stress reaction 09/17/2015  . 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 Rosacea 12/27/2014  . Pulmonary HTN ([place]) 10/20/2014  . DOE (dyspnea on exertion) 09/26/2014  . PVCs (premature ventricular contractions) 09/26/2014  . Globus sensation 08/18/2014  . Left knee pain 08/18/2014  . Encounter for Medicare annual wellness exam 03/18/2014  . Muscle pain 10/15/2012  . Fatigue 05/31/2012  . Fibromyalgia 05/31/2012  . Neck mass 05/31/2012  . Special screening for malignant neoplasms, colon 09/26/2011  . Hoarseness 07/25/2011  . GERD (gastroesophageal reflux disease) 07/08/2011  . CERVICAL RADICULOPATHY, RIGHT 09/04/2010  . SKIN RASH 04/22/2010  . PULMONARY NODULE 09/14/2009  . PALPITATIONS 08/17/2009  . UNSPECIFIED VITAMIN D DEFICIENCY 02/22/2009  . HYPERCHOLESTEROLEMIA 02/22/2009  . Adjustment disorder with mixed anxiety and depressed mood 02/22/2009  .  ALLERGIC RHINITIS 02/22/2009  . MENOPAUSAL SYNDROME 02/22/2009  . Osteopenia 02/22/2009   Past Medical History:  Diagnosis Date  . Allergy    all year  . Anxiety   . Bursitis of hip   . Cataract   . Cervical cancer ([place]) 1978  . Depression   . Fibromyalgia   . GERD (gastroesophageal reflux disease)   . High cholesterol   . History of Clostridium difficile infection    In past from augmentin  . Osteopenia   . Palpitations   . Panic disorder   . Pulmonary HTN ([place]) 10/20/2014   moderate with PASP 34mmHg  . Pulmonary nodule    Being observed  . Vitamin D deficiency    Past Surgical History:  Procedure Laterality Date  . ABDOMINAL HYSTERECTOMY  1978  . COLONOSCOPY  2002   Normal  . HAND SURGERY  2005   Left hand thrombosis  . NASAL HEMORRHAGE CONTROL  2007  . RIGHT HEART CATHETERIZATION N/A 03/16/2015   Procedure: RIGHT HEART CATH;  Surgeon: Larey Dresser, MD;  Location: [facility] CATH LAB;  Service: Cardiovascular;  Laterality: N/A;  . SHOULDER SURGERY  2008   Right shoulder bone spur   Social History  Substance Use Topics  . Smoking status: Former Smoker    Packs/day: 1.00    Years: 17.00    Types: Cigarettes    Quit date: 12/08/1978  . Smokeless tobacco: Never Used  .  Alcohol use No   Family History  Problem Relation Age of Onset  . Heart attack Mother 40  . Emphysema Mother   . Allergies Mother   . Asthma Mother   . Allergies Father   . Glaucoma Father   . Arrhythmia Sister     Atrial fibrillation  . Cancer Sister     breast  . Emphysema Sister   . Heart disease Sister   . Rheumatologic disease Sister   . Lung cancer Sister   . Diabetes Other     Parent  . Hyperlipidemia Other     Other relative  . Hypertension Other     Parent, other relative  . Breast cancer Other     Other relative, sister  . Allergies Other     Siblings  . Asthma Other     Sisters  . Colon cancer Neg Hx   . Esophageal cancer Neg Hx   . Rectal cancer Neg Hx   . Stomach cancer  Neg Hx    Allergies  Allergen Reactions  . Adhesive [Tape] Rash    Burns skins   . Amoxicillin-Pot Clavulanate Other (See Comments)    Cdiff  . Simvastatin     myalgias   Current Outpatient Prescriptions on File Prior to Visit  Medication Sig Dispense Refill  . Cetirizine HCl (ZYRTEC ALLERGY PO) Take 1 tablet by mouth daily.    . chlorpheniramine (CHLOR-TRIMETON) 4 MG tablet Take 8 mg by mouth 2 (two) times daily as needed for allergies.    Marland Kitchen estradiol (ESTRACE) 1 MG tablet Take 1 tablet (1 mg total) by mouth daily. 100 tablet 4  . isosorbide mononitrate (IMDUR) 60 MG 24 hr tablet TAKE 1 TABLET (60 MG TOTAL) BY MOUTH DAILY. 90 tablet 3  . metoprolol succinate (TOPROL-XL) 25 MG 24 hr tablet Take 0.5 tablets (12.5 mg total) by mouth daily. 45 tablet 6  . metroNIDAZOLE (METROCREAM) 0.75 % cream Apply topically 2 (two) times daily. Apply to affected areas once to twice daily 45 g 3  . pantoprazole (PROTONIX) 40 MG tablet TAKE 1 TABLET (40 MG TOTAL) BY MOUTH DAILY. 90 tablet 3  . sertraline (ZOLOFT) 50 MG tablet Take 1 tablet (50 mg total) by mouth daily. 30 tablet 11  . traZODone (DESYREL) 100 MG tablet Take 1 tablet (100 mg total) by mouth at bedtime. 90 tablet 3   No current facility-administered medications on file prior to visit.     Review of Systems Review of Systems  Constitutional: Negative for fever, appetite change, fatigue and unexpected weight change.  Eyes: Negative for pain and visual disturbance.  Respiratory: Negative for cough and shortness of breath.   Cardiovascular: Negative for cp or palpitations    Gastrointestinal: Negative for nausea, diarrhea and constipation.  Genitourinary: Negative for urgency and frequency.  Skin: Negative for pallor or rash   MSK pos for L neck and shoulder pain  Neurological: Negative for weakness, light-headedness,  and headaches. pos for n/t over lateral L arm intermittently Hematological: Negative for adenopathy. Does not bruise/bleed  easily.  Psychiatric/Behavioral: Negative for dysphoric mood. The patient is not nervous/anxious.         Objective:   Physical Exam  Constitutional: She appears well-developed and well-nourished. No distress.  HENT:  Head: Normocephalic and atraumatic.  Mouth/Throat: Oropharynx is clear and moist.  Eyes: Conjunctivae and EOM are normal. Pupils are equal, round, and reactive to light.  Neck: Normal range of motion. Neck supple.  Cardiovascular: Normal rate  and regular rhythm.   Pulmonary/Chest: Effort normal and breath sounds normal.  Musculoskeletal: She exhibits tenderness. She exhibits no edema.       Cervical back: She exhibits tenderness, bony tenderness, pain and spasm. She exhibits normal range of motion, no edema and no deformity.  Tender over lower cervical spines and L cervical musculature  Spasm noted in L trap and cervical muscles  Nl rom -pain on L tilt and rotation  No crepitus   Lymphadenopathy:    She has no cervical adenopathy.  Neurological: She has normal strength and normal reflexes. A sensory deficit is present. No cranial nerve deficit. She exhibits normal muscle tone. She displays a negative Romberg sign. Coordination and gait normal.  Dec sens to lt touch over L thumb and index finger (tip) Neg Tinel and Phalen tests bilat hands/wrists    Skin: Skin is warm and dry. No rash noted. No erythema.  Psychiatric: She has a normal mood and affect.          Assessment & Plan:   Problem List Items Addressed This Visit      Other   Neck pain on left side    With radicular symptoms to L arm  Hx of herniated discs in the past - improved with traction and rehab Flexeril qhs px  mobic 15 mg daily with food  Recommend heat and tens unit as needed CS xray today  Plan to follow result       Relevant Orders   DG Cervical Spine Complete    Other Visit Diagnoses   None.

## 2016-10-16 ENCOUNTER — Other Ambulatory Visit: Payer: Self-pay | Admitting: Family Medicine

## 2016-10-16 NOTE — Telephone Encounter (Signed)
done

## 2016-10-16 NOTE — Telephone Encounter (Signed)
Please refill both for a year  

## 2016-10-16 NOTE — Telephone Encounter (Signed)
Pt meds last filled on 09/17/15 #90 with 3 add refills, pt has her AWV and CPE in 08/2016, please advise

## 2016-10-27 ENCOUNTER — Other Ambulatory Visit: Payer: Self-pay | Admitting: Family Medicine

## 2016-10-27 NOTE — Telephone Encounter (Signed)
Last OV was 09/01/16, last filled on 09/01/16 #30 tabs with 1 additional refill, please advise

## 2016-10-27 NOTE — Telephone Encounter (Signed)
Please refill for a year  

## 2016-10-27 NOTE — Telephone Encounter (Signed)
done

## 2016-12-13 ENCOUNTER — Other Ambulatory Visit (HOSPITAL_COMMUNITY): Payer: Self-pay | Admitting: Cardiology

## 2016-12-24 ENCOUNTER — Ambulatory Visit: Payer: PPO | Admitting: Family Medicine

## 2017-01-06 ENCOUNTER — Other Ambulatory Visit: Payer: Self-pay | Admitting: Family Medicine

## 2017-01-06 DIAGNOSIS — Z1231 Encounter for screening mammogram for malignant neoplasm of breast: Secondary | ICD-10-CM

## 2017-01-08 ENCOUNTER — Ambulatory Visit (INDEPENDENT_AMBULATORY_CARE_PROVIDER_SITE_OTHER): Payer: PPO | Admitting: Family Medicine

## 2017-01-08 ENCOUNTER — Encounter: Payer: Self-pay | Admitting: Family Medicine

## 2017-01-08 VITALS — BP 124/78 | HR 76 | Temp 98.3°F | Ht 64.0 in | Wt 164.5 lb

## 2017-01-08 DIAGNOSIS — R1313 Dysphagia, pharyngeal phase: Secondary | ICD-10-CM | POA: Diagnosis not present

## 2017-01-08 DIAGNOSIS — K219 Gastro-esophageal reflux disease without esophagitis: Secondary | ICD-10-CM | POA: Insufficient documentation

## 2017-01-08 DIAGNOSIS — M25561 Pain in right knee: Secondary | ICD-10-CM | POA: Diagnosis not present

## 2017-01-08 DIAGNOSIS — Q892 Congenital malformations of other endocrine glands: Secondary | ICD-10-CM | POA: Diagnosis not present

## 2017-01-08 DIAGNOSIS — G8929 Other chronic pain: Secondary | ICD-10-CM

## 2017-01-08 DIAGNOSIS — M1711 Unilateral primary osteoarthritis, right knee: Secondary | ICD-10-CM

## 2017-01-08 NOTE — Progress Notes (Signed)
Pre visit review using our clinic review tool, if applicable. No additional management support is needed unless otherwise documented below in the visit note. 

## 2017-01-08 NOTE — Progress Notes (Signed)
Dr. Frederico Hamman T. Karielle Davidow, MD, [facility] [place], [address] Phone: [phone] Fax: [phone]  01/08/2017  Patient: Mary Gordon, MRN: [id number], DOB: [birth year], 71 y.o.  Primary Physician:  Loura Pardon, MD   Chief Complaint  Patient presents with  . Knee Pain    Right   Subjective:   Mary Gordon is a 71 y.o. very pleasant female patient who presents with the following:  R knee pain:  Playning some pickleball.  Flexion causes a lot of pain.  I reviewed her prior x-rays with her, and they showed some mild osteoarthritic changes.  Primarily she is having pain in the medial compartment. Ibuprofen does seem to help this.  Many years ago, caught between a tree and horse.  No surg no f  rx knee brace  Past Medical History, Surgical History, Social History, Family History, Problem List, Medications, and Allergies have been reviewed and updated if relevant.  Patient Active Problem List   Diagnosis Date Noted  . Neck pain on left side 09/01/2016  . Hyperglycemia 08/25/2016  . Routine general medical examination at a health care facility 08/19/2016  . Need for hepatitis C screening test 08/19/2016  . Stress reaction 09/17/2015  . 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 Rosacea 12/27/2014  . Pulmonary HTN 10/20/2014  . DOE (dyspnea on exertion) 09/26/2014  . PVCs (premature ventricular contractions) 09/26/2014  . Globus sensation 08/18/2014  . Left knee pain 08/18/2014  . Encounter for Medicare annual wellness exam 03/18/2014  . Muscle pain 10/15/2012  . Fatigue 05/31/2012  . Fibromyalgia 05/31/2012  . Neck mass 05/31/2012  . Special screening for malignant neoplasms, colon 09/26/2011  . Hoarseness 07/25/2011  . GERD (gastroesophageal reflux disease) 07/08/2011  . CERVICAL RADICULOPATHY, RIGHT 09/04/2010  . SKIN RASH 04/22/2010  . PULMONARY NODULE 09/14/2009  . PALPITATIONS 08/17/2009  .  UNSPECIFIED VITAMIN D DEFICIENCY 02/22/2009  . HYPERCHOLESTEROLEMIA 02/22/2009  . Adjustment disorder with mixed anxiety and depressed mood 02/22/2009  . ALLERGIC RHINITIS 02/22/2009  . MENOPAUSAL SYNDROME 02/22/2009  . Osteopenia 02/22/2009    Past Medical History:  Diagnosis Date  . Allergy    all year  . Anxiety   . Bursitis of hip   . Cataract   . Cervical cancer ([place]) 1978  . Depression   . Fibromyalgia   . GERD (gastroesophageal reflux disease)   . High cholesterol   . History of Clostridium difficile infection    In past from augmentin  . Osteopenia   . Palpitations   . Panic disorder   . Pulmonary HTN 10/20/2014   moderate with PASP 60mmHg  . Pulmonary nodule    Being observed  . Vitamin D deficiency     Past Surgical History:  Procedure Laterality Date  . ABDOMINAL HYSTERECTOMY  1978  . COLONOSCOPY  2002   Normal  . HAND SURGERY  2005   Left hand thrombosis  . NASAL HEMORRHAGE CONTROL  2007  . RIGHT HEART CATHETERIZATION N/A 03/16/2015   Procedure: RIGHT HEART CATH;  Surgeon: Larey Dresser, MD;  Location: [facility] CATH LAB;  Service: Cardiovascular;  Laterality: N/A;  . SHOULDER SURGERY  2008   Right shoulder bone spur    Social History   Social History  . Marital status: Married    Spouse name: N/A  . Number of children: N/A  . Years of education: N/A   Occupational History  . Retired from book keeping  Social History Main Topics  . Smoking status: Former Smoker    Packs/day: 1.00    Years: 17.00    Types: Cigarettes    Quit date: 12/08/1978  . Smokeless tobacco: Never Used  . Alcohol use No  . Drug use: No  . Sexual activity: Yes    Partners: Male   Other Topics Concern  . Not on file   Social History Narrative   Married (husband is colon cancer survivor)   Does not get regular exercise   G4P1    Family History  Problem Relation Age of Onset  . Heart attack Mother 45  . Emphysema Mother   . Allergies Mother   . Asthma Mother   .  Allergies Father   . Glaucoma Father   . Arrhythmia Sister     Atrial fibrillation  . Cancer Sister     breast  . Emphysema Sister   . Heart disease Sister   . Rheumatologic disease Sister   . Lung cancer Sister   . Diabetes Other     Parent  . Hyperlipidemia Other     Other relative  . Hypertension Other     Parent, other relative  . Breast cancer Other     Other relative, sister  . Allergies Other     Siblings  . Asthma Other     Sisters  . Colon cancer Neg Hx   . Esophageal cancer Neg Hx   . Rectal cancer Neg Hx   . Stomach cancer Neg Hx     Allergies  Allergen Reactions  . Adhesive [Tape] Rash    Burns skins   . Amoxicillin-Pot Clavulanate Other (See Comments)    Cdiff  . Simvastatin     myalgias    Medication list reviewed and updated in full in [place].  GEN: No fevers, chills. Nontoxic. Primarily MSK c/o today. MSK: Detailed in the HPI GI: tolerating PO intake without difficulty Neuro: No numbness, parasthesias, or tingling associated. Otherwise the pertinent positives of the ROS are noted above.   Objective:   BP 124/78   Pulse 76   Temp 98.3 F (36.8 C) (Oral)   Ht 5\' 4"  (1.626 m)   Wt 164 lb 8 oz (74.6 kg)   BMI 28.24 kg/m    GEN: WDWN, NAD, Non-toxic, Alert & Oriented x 3 HEENT: Atraumatic, Normocephalic.  Ears and Nose: No external deformity. EXTR: No clubbing/cyanosis/edema NEURO: Normal gait.  PSYCH: Normally interactive. Conversant. Not depressed or anxious appearing.  Calm demeanor.   Knee:  R Gait: Normal heel toe pattern ROM: 0-120 Effusion: neg Echymosis or edema: none Patellar tendon NT Painful PLICA: neg Patellar grind: negative Medial and lateral patellar facet loading: mild medial patellar facet pain medial and lateral joint lines: medial joint line pain Pes mildly ttp Mcmurray's neg Flexion-pinch neg Varus and valgus stress: stable Lachman: neg Ant and Post drawer: neg Hip abduction, IR, ER: WNL Hip  flexion str: 5/5 Hip abd: 5/5 Quad: 5/5 VMO atrophy:No Hamstring concentric and eccentric: 5/5   Radiology: No results found.  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  Mild has bursitis also right now.  For now I would use some ibuprofen or Tylenol as needed.  Recommended icing after she plays pickle ball.  Recommended wearing a knee brace for pickle ball also.  Given the size discrepancy of 5 versus lower extremity, she has had difficulty with these in the past, so I wrote a prescription for her to be fitted at [facility].  Follow-up: No Follow-up on file.  Medications Discontinued During This Encounter  Medication Reason  . metroNIDAZOLE (METROCREAM) 0.75 % cream Completed Course  . meloxicam (MOBIC) 15 MG tablet Completed Course  . cyclobenzaprine (FLEXERIL) 10 MG tablet Completed Course  . Cetirizine HCl (ZYRTEC ALLERGY PO) Completed Course   No orders of the defined types were placed in this encounter.   Signed,  Maud Deed. Allante Whitmire, MD   Allergies as of 01/08/2017      Reactions   Adhesive [tape] Rash   Burns skins    Amoxicillin-pot Clavulanate Other (See Comments)   Cdiff   Simvastatin    myalgias      Medication List       Accurate as of 01/08/17 11:59 PM. Always use your most recent med list.          chlorpheniramine 4 MG tablet Commonly known as:  CHLOR-TRIMETON Take 8 mg by mouth 2 (two) times daily as needed for allergies.   estradiol 1 MG tablet Commonly known as:  ESTRACE Take 1 tablet (1 mg total) by mouth daily.   isosorbide mononitrate 60 MG 24 hr tablet Commonly known as:  IMDUR TAKE 1 TABLET (60 MG TOTAL) BY MOUTH DAILY.   metoprolol succinate 25 MG 24 hr tablet Commonly known as:  TOPROL-XL Take 0.5 tablets (12.5 mg total) by mouth daily.   pantoprazole 40 MG  tablet Commonly known as:  PROTONIX Take 1 tablet (40 mg total) by mouth daily.   sertraline 50 MG tablet Commonly known as:  ZOLOFT Take 1 tablet (50 mg total) by mouth daily.   traZODone 100 MG tablet Commonly known as:  DESYREL TAKE 1 TABLET (100 MG TOTAL) BY MOUTH AT BEDTIME.

## 2017-02-03 ENCOUNTER — Ambulatory Visit
Admission: RE | Admit: 2017-02-03 | Discharge: 2017-02-03 | Disposition: A | Discharge status: Home / Self Care | Payer: PPO | Source: Ambulatory Visit | Attending: Family Medicine | Admitting: Family Medicine

## 2017-02-03 DIAGNOSIS — Z1231 Encounter for screening mammogram for malignant neoplasm of breast: Secondary | ICD-10-CM | POA: Diagnosis not present

## 2017-02-12 ENCOUNTER — Encounter: Payer: Self-pay | Admitting: Allergy

## 2017-02-12 ENCOUNTER — Ambulatory Visit (INDEPENDENT_AMBULATORY_CARE_PROVIDER_SITE_OTHER): Payer: PPO | Admitting: Allergy

## 2017-02-12 VITALS — BP 120/68 | HR 84 | Temp 98.2°F | Resp 16 | Ht 63.5 in | Wt 162.6 lb

## 2017-02-12 DIAGNOSIS — J3089 Other allergic rhinitis: Secondary | ICD-10-CM

## 2017-02-12 DIAGNOSIS — K219 Gastro-esophageal reflux disease without esophagitis: Secondary | ICD-10-CM

## 2017-02-12 NOTE — Progress Notes (Signed)
New Patient Note  RE: RAEGYN RENDA MRN: [id number] DOB: 07-21-1946 Date of Office Visit: 02/12/2017  Referring provider: Abner Greenspan, MD Primary care provider: Loura Pardon, MD  Chief Complaint: allergies  History of present illness: Mary Gordon is a 71 y.o. female presenting today for consultation for allergies.  She states she would like to have allergy testing done today.  She has never been tested before. She has been having allergy related symptoms since she was in her 42s.  She reports mostly stuffy nose and occasional cough. She reports sometimes she will have pain/pressure in her sinuses.  Symptoms are present all year round but reports are worse in the spring and fall.  She also reports having a sensation of something being stuck in her throat. 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  She states she does have some difficulty with swallowing both solids and liquids however she does not feel any chest pain or discomfort and no sensation of food getting stuck in her chest.  She did have surgery for resection of a thyroglossal duct cyst. She states she did not have the throat sensations prior to the resection.  She does have a history of reflux and per Dr. Redmond Baseman recent notes they are doing a trial on twice a day PPI to see if she has any improvements in her symptoms.  For control of her allergy-like symptoms she uses chlorpheniramine 8 mg twice a day which she states helps control her symptoms. She has used Flonase in the past but reports that gave her sore throat so she stopped using. She has also tried Nasonex which she reports did help and she stop using this when her symptoms improved. She also reports she will use Afrin only when she is severely congested. She reports she has used Afrin to the point that she had rebound nasal congestion but she does not want this to happen again.  She denies any history of asthma, eczema or food allergy.  Review of  systems: Review of Systems  Constitutional: Negative for chills, fever and malaise/fatigue.  HENT: Positive for congestion. Negative for ear discharge, ear pain, nosebleeds, sinus pain, sore throat and tinnitus.   Eyes: Negative for discharge and redness.  Respiratory: Positive for cough. Negative for shortness of breath and wheezing.   Cardiovascular: Negative for chest pain.  Gastrointestinal: Positive for heartburn. Negative for abdominal pain, diarrhea, nausea and vomiting.  Musculoskeletal: Negative for joint pain and myalgias.  Skin: Negative for itching and rash.  Neurological: Negative for headaches.    All other systems negative unless noted above in HPI  Past medical history: Past Medical History:  Diagnosis Date  . Allergy    all year  . Anxiety   . Bursitis of hip   . Cataract   . Cervical cancer ([place]) 1978  . Depression   . Fibromyalgia   . GERD (gastroesophageal reflux disease)   . High cholesterol   . History of Clostridium difficile infection    In past from augmentin  . Osteopenia   . Palpitations   . Panic disorder   . Pulmonary HTN 10/20/2014   moderate with PASP 33mmHg  . Pulmonary nodule    Being observed  . Vitamin D deficiency     Past surgical history: Past Surgical History:  Procedure Laterality Date  . ABDOMINAL HYSTERECTOMY  1978  . COLONOSCOPY  2002   Normal  . HAND SURGERY  2005   Left  hand thrombosis  . NASAL HEMORRHAGE CONTROL  2007  . RIGHT HEART CATHETERIZATION N/A 03/16/2015   Procedure: RIGHT HEART CATH;  Surgeon: Larey Dresser, MD;  Location: [facility] CATH LAB;  Service: Cardiovascular;  Laterality: N/A;  . SHOULDER SURGERY  2008   Right shoulder bone spur    Family history:  Family History  Problem Relation Age of Onset  . Heart attack Mother 45  . Emphysema Mother   . Allergies Mother   . Asthma Mother   . Allergies Father   . Glaucoma Father   . Arrhythmia Sister     Atrial fibrillation  . Cancer Sister     breast  .  Emphysema Sister   . Heart disease Sister   . Rheumatologic disease Sister   . Lung cancer Sister   . Diabetes Other     Parent  . Hyperlipidemia Other     Other relative  . Hypertension Other     Parent, other relative  . Breast cancer Other     Other relative, sister  . Allergies Other     Siblings  . Asthma Other     Sisters  . Colon cancer Neg Hx   . Esophageal cancer Neg Hx   . Rectal cancer Neg Hx   . Stomach cancer Neg Hx     Social history: She lives in a home with carpeting in the bedroom with electric heating and central cooling. There are 2 cats in the home. There is no concern for water damage or mildew erosions in the home.  Social History  . Marital status: Married   Occupational History  . Retired from book keeping    Social History Main Topics  . Smoking status: Former Smoker    Packs/day: 1.00    Years: 17.00    Types: Cigarettes    Quit date: 12/08/1978  . Smokeless tobacco: Never Used    Medication List: Allergies as of 02/12/2017      Reactions   Adhesive [tape] Rash   Burns skins    Amoxicillin-pot Clavulanate Other (See Comments)   Cdiff   Simvastatin    myalgias      Medication List       Accurate as of 02/12/17 10:36 AM. Always use your most recent med list.          chlorpheniramine 4 MG tablet Commonly known as:  CHLOR-TRIMETON Take 8 mg by mouth 2 (two) times daily as needed for allergies.   estradiol 1 MG tablet Commonly known as:  ESTRACE Take 1 tablet (1 mg total) by mouth daily.   isosorbide mononitrate 60 MG 24 hr tablet Commonly known as:  IMDUR TAKE 1 TABLET (60 MG TOTAL) BY MOUTH DAILY.   metoprolol succinate 25 MG 24 hr tablet Commonly known as:  TOPROL-XL Take 0.5 tablets (12.5 mg total) by mouth daily.   metroNIDAZOLE 0.75 % cream Commonly known as:  METROCREAM   pantoprazole 40 MG tablet Commonly known as:  PROTONIX Take 1 tablet (40 mg total) by mouth daily.   sertraline 50 MG tablet Commonly known as:   ZOLOFT Take 1 tablet (50 mg total) by mouth daily.   traZODone 100 MG tablet Commonly known as:  DESYREL TAKE 1 TABLET (100 MG TOTAL) BY MOUTH AT BEDTIME.   Turmeric 500 MG Caps Take 1 capsule by mouth daily.   Vitamin D3 10000 units capsule Take 10,000 Units by mouth daily.       Known medication allergies: Allergies  Allergen Reactions  . Adhesive [Tape] Rash    Burns skins   . Amoxicillin-Pot Clavulanate Other (See Comments)    Cdiff  . Simvastatin     myalgias     Physical examination: Blood pressure 120/68, pulse 84, temperature 98.2 F (36.8 C), temperature source Oral, resp. rate 16, height 5' 3.5" (1.613 m), weight 162 lb 9.6 oz (73.8 kg), SpO2 95 %.  General: Alert, interactive, in no acute distress. HEENT: TMs pearly gray, turbinates moderately edematous without discharge R>L, post-pharynx non erythematous, drainage present in oropharynx. Neck: Supple without lymphadenopathy. Lungs: Clear to auscultation without wheezing, rhonchi or rales. {no increased work of breathing. CV: Normal S1, S2 without murmurs. Abdomen: Nondistended, nontender. Skin: Warm and dry, without lesions or rashes. Extremities:  No clubbing, cyanosis or edema. Neuro:   Grossly intact.  Diagnositics/Labs:  Allergy testing: Skin testing was mildly positive to epicoccum nigrum only. Allergy testing results were read and interpreted by provider, documented by clinical staff.   Assessment and plan:   Allergic rhinitis    - she does show mild sensitization to one mold    - she may have more of a component of non-allergic rhinitis in addition to allergic rhinitis    - she may use chlorpheniramine as needed for control nasal and/or ocular symptoms.   LPR     - may be leading to her globus sensation -- I do not feel she has any significant post-nasal drip contributing to this sensation     - follow by Dr. Redmond Baseman and she will continue her PPI twice a day trial  Follow-up as needed   I  appreciate the opportunity to take part in [place] care. Please do not hesitate to contact me with questions.  Sincerely,   Prudy Feeler, MD Allergy/Immunology [facility] [place]

## 2017-02-12 NOTE — Patient Instructions (Addendum)
Allergy testing is only positive for 1 mold (epicoccum nigrum).    Allergy-like symptoms may be related to non-allergic causes like scents/odors, large particle irritants like dust.    You may use your chlorpheniramine as needed for any allergy symptoms.    Follow-up as needed

## 2017-02-13 ENCOUNTER — Telehealth: Payer: Self-pay | Admitting: *Deleted

## 2017-02-13 DIAGNOSIS — H2511 Age-related nuclear cataract, right eye: Secondary | ICD-10-CM | POA: Diagnosis not present

## 2017-02-13 NOTE — Telephone Encounter (Signed)
Patient called states has bumps on arms where ids where placed yesterday. Advised to apply cool compresses take antihistamine and apply hydrocortisone cream patient verbalized understanding

## 2017-03-19 DIAGNOSIS — R1313 Dysphagia, pharyngeal phase: Secondary | ICD-10-CM | POA: Diagnosis not present

## 2017-03-19 DIAGNOSIS — K219 Gastro-esophageal reflux disease without esophagitis: Secondary | ICD-10-CM | POA: Diagnosis not present

## 2017-05-09 ENCOUNTER — Ambulatory Visit (HOSPITAL_COMMUNITY)
Admission: EM | Admit: 2017-05-09 | Discharge: 2017-05-09 | Disposition: A | Discharge status: Home / Self Care | Payer: PPO | Attending: Internal Medicine | Admitting: Internal Medicine

## 2017-05-09 ENCOUNTER — Encounter (HOSPITAL_COMMUNITY): Payer: Self-pay | Admitting: Emergency Medicine

## 2017-05-09 DIAGNOSIS — S0502XA Injury of conjunctiva and corneal abrasion without foreign body, left eye, initial encounter: Secondary | ICD-10-CM

## 2017-05-09 MED ORDER — POLYMYXIN B-TRIMETHOPRIM 10000-0.1 UNIT/ML-% OP SOLN
1.0000 [drp] | OPHTHALMIC | 0 refills | Status: DC
Start: 2017-05-09 — End: 2017-08-19

## 2017-05-09 NOTE — Discharge Instructions (Signed)
You have corneal abrasion on your left eye and it is mild.  Please take motrin and tylenol over the counter as directed for pain and discomfort.  Take eye drops as prescribed.

## 2017-05-09 NOTE — ED Provider Notes (Signed)
CSN: [id number]     Arrival date & time 05/09/17  1749 History   None    Chief Complaint  Patient presents with  . Eye Injury   (Consider location/radiation/quality/duration/timing/severity/associated sxs/prior Treatment) Patient states she walked into a clothes line yesterday and today she is c/o left eye pain.   The history is provided by the patient.  Eye Injury  This is a new problem. The problem occurs constantly. The problem has not changed since onset.   Past Medical History:  Diagnosis Date  . Allergy    all year  . Anxiety   . Bursitis of hip   . Cataract   . Cervical cancer ([place]) 1978  . Depression   . Fibromyalgia   . GERD (gastroesophageal reflux disease)   . High cholesterol   . History of Clostridium difficile infection    In past from augmentin  . Osteopenia   . Palpitations   . Panic disorder   . Pulmonary HTN ([place]) 10/20/2014   moderate with PASP 17mmHg  . Pulmonary nodule    Being observed  . Vitamin D deficiency    Past Surgical History:  Procedure Laterality Date  . ABDOMINAL HYSTERECTOMY  1978  . COLONOSCOPY  2002   Normal  . HAND SURGERY  2005   Left hand thrombosis  . NASAL HEMORRHAGE CONTROL  2007  . RIGHT HEART CATHETERIZATION N/A 03/16/2015   Procedure: RIGHT HEART CATH;  Surgeon: Larey Dresser, MD;  Location: [facility] CATH LAB;  Service: Cardiovascular;  Laterality: N/A;  . SHOULDER SURGERY  2008   Right shoulder bone spur   Family History  Problem Relation Age of Onset  . Heart attack Mother 31  . Emphysema Mother   . Allergies Mother   . Asthma Mother   . Allergies Father   . Glaucoma Father   . Arrhythmia Sister        Atrial fibrillation  . Cancer Sister        breast  . Emphysema Sister   . Heart disease Sister   . Rheumatologic disease Sister   . Lung cancer Sister   . Diabetes Other        Parent  . Hyperlipidemia Other        Other relative  . Hypertension Other        Parent, other relative  . Breast cancer Other         Other relative, sister  . Allergies Other        Siblings  . Asthma Other        Sisters  . Colon cancer Neg Hx   . Esophageal cancer Neg Hx   . Rectal cancer Neg Hx   . Stomach cancer Neg Hx    Social History  Substance Use Topics  . Smoking status: Former Smoker    Packs/day: 1.00    Years: 17.00    Types: Cigarettes    Quit date: 12/08/1978  . Smokeless tobacco: Never Used  . Alcohol use No   OB History    Gravida Para Term Preterm AB Living   4 1           SAB TAB Ectopic Multiple Live Births                 Review of Systems  Constitutional: Negative.   HENT: Negative.   Eyes: Positive for pain.  Respiratory: Negative.   Cardiovascular: Negative.   Gastrointestinal: Negative.   Endocrine: Negative.   Genitourinary:  Negative.   Musculoskeletal: Negative.   Allergic/Immunologic: Negative.   Neurological: Negative.   Hematological: Negative.   Psychiatric/Behavioral: Negative.     Allergies  Adhesive [tape]; Amoxicillin-pot clavulanate; and Simvastatin  Home Medications   Prior to Admission medications   Medication Sig Start Date End Date Taking? Authorizing Provider  chlorpheniramine (CHLOR-TRIMETON) 4 MG tablet Take 8 mg by mouth 2 (two) times daily as needed for allergies.   Yes [provider]  Cholecalciferol (VITAMIN D3) 10000 units capsule Take 10,000 Units by mouth daily.   Yes [provider]  estradiol (ESTRACE) 1 MG tablet Take 1 tablet (1 mg total) by mouth daily. 08/13/16  Yes Dove, Myra C, MD  isosorbide mononitrate (IMDUR) 60 MG 24 hr tablet TAKE 1 TABLET (60 MG TOTAL) BY MOUTH DAILY. 12/15/16  Yes Larey Dresser, MD  metoprolol succinate (TOPROL-XL) 25 MG 24 hr tablet Take 0.5 tablets (12.5 mg total) by mouth daily. 08/12/16  Yes Larey Dresser, MD  pantoprazole (PROTONIX) 40 MG tablet Take 1 tablet (40 mg total) by mouth daily. Patient taking differently: Take 40 mg by mouth 2 (two) times daily.  10/16/16  Yes [name], Wynelle Fanny, MD  sertraline (ZOLOFT) 50 MG tablet Take 1 tablet (50 mg total) by mouth daily. 08/18/16  Yes [name], Wynelle Fanny, MD  traZODone (DESYREL) 100 MG tablet TAKE 1 TABLET (100 MG TOTAL) BY MOUTH AT BEDTIME. 10/16/16  Yes [name], Wynelle Fanny, MD  Turmeric 500 MG CAPS Take 1 capsule by mouth daily.   Yes [provider]  metroNIDAZOLE (METROCREAM) 0.75 % cream  01/30/17   [provider]  trimethoprim-polymyxin b (POLYTRIM) ophthalmic solution Place 1 drop into the left eye every 4 (four) hours. 05/09/17   Lysbeth Penner, FNP   Meds Ordered and Administered this Visit  Medications - No data to display  BP (!) 133/59 (BP Location: Left Arm)   Pulse 72   Temp 98 F (36.7 C) (Oral)   Resp 20   SpO2 99%  No data found.   Physical Exam  Constitutional: She appears well-developed and well-nourished.  HENT:  Head: Normocephalic and atraumatic.  Eyes: Conjunctivae and EOM are normal. Pupils are equal, round, and reactive to light.  Left eye with tetracaine 2 gtt's applied then flourescein and very small corneal abrasion seen at 12:00 position.  Nursing note and vitals reviewed.   Urgent Care Course     Procedures (including critical care time)  Labs Review Labs Reviewed - No data to display  Imaging Review No results found.   Visual Acuity Review  Right Eye Distance:   Left Eye Distance:   Bilateral Distance:    Right Eye Near:   Left Eye Near:    Bilateral Near:         MDM   1. Abrasion of left cornea, initial encounter    Polytrim eye gtt's 1 gtt OS q 4 hours x 7 days #10 ml    Lysbeth Penner, FNP 05/09/17 1918

## 2017-05-09 NOTE — ED Triage Notes (Signed)
Pt reports she ran into a dirty wire yest and inj her left eye  Sx today include pain  Denies blurred vision  A&O x4... NAD... Ambulatory... Husband at bedside.

## 2017-06-11 ENCOUNTER — Other Ambulatory Visit: Payer: Self-pay | Admitting: Family Medicine

## 2017-06-17 ENCOUNTER — Telehealth: Payer: Self-pay | Admitting: Family Medicine

## 2017-06-17 NOTE — Telephone Encounter (Signed)
Last CPE was 08/25/16 and no future appts., please advise

## 2017-06-17 NOTE — Telephone Encounter (Signed)
Please schedule her next PE and refill until then Thanks

## 2017-06-18 NOTE — Telephone Encounter (Signed)
Info given to [facility] so schedule CPE/AWV. And med refilled once

## 2017-06-18 NOTE — Telephone Encounter (Signed)
Last AWV was 08/18/16  Scheduled AWV 08/19/17 Scheduled CPE 08/26/17

## 2017-07-01 DIAGNOSIS — H2511 Age-related nuclear cataract, right eye: Secondary | ICD-10-CM | POA: Diagnosis not present

## 2017-07-03 DIAGNOSIS — H2511 Age-related nuclear cataract, right eye: Secondary | ICD-10-CM | POA: Diagnosis not present

## 2017-07-08 ENCOUNTER — Encounter: Payer: Self-pay | Admitting: *Deleted

## 2017-07-14 ENCOUNTER — Encounter: Payer: Self-pay | Admitting: *Deleted

## 2017-07-14 ENCOUNTER — Encounter
Admission: RE | Disposition: A | Discharge status: Home / Self Care | Payer: Self-pay | Source: Ambulatory Visit | Attending: Ophthalmology

## 2017-07-14 ENCOUNTER — Ambulatory Visit
Admission: RE | Admit: 2017-07-14 | Discharge: 2017-07-14 | Disposition: A | Discharge status: Home / Self Care | Payer: PPO | Source: Ambulatory Visit | Attending: Ophthalmology | Admitting: Ophthalmology

## 2017-07-14 ENCOUNTER — Ambulatory Visit: Payer: PPO | Admitting: Anesthesiology

## 2017-07-14 DIAGNOSIS — F418 Other specified anxiety disorders: Secondary | ICD-10-CM | POA: Diagnosis not present

## 2017-07-14 DIAGNOSIS — F419 Anxiety disorder, unspecified: Secondary | ICD-10-CM | POA: Diagnosis not present

## 2017-07-14 DIAGNOSIS — Z8541 Personal history of malignant neoplasm of cervix uteri: Secondary | ICD-10-CM | POA: Insufficient documentation

## 2017-07-14 DIAGNOSIS — E559 Vitamin D deficiency, unspecified: Secondary | ICD-10-CM | POA: Diagnosis not present

## 2017-07-14 DIAGNOSIS — Z9889 Other specified postprocedural states: Secondary | ICD-10-CM | POA: Diagnosis not present

## 2017-07-14 DIAGNOSIS — H2511 Age-related nuclear cataract, right eye: Secondary | ICD-10-CM | POA: Diagnosis not present

## 2017-07-14 DIAGNOSIS — K449 Diaphragmatic hernia without obstruction or gangrene: Secondary | ICD-10-CM | POA: Diagnosis not present

## 2017-07-14 DIAGNOSIS — M8580 Other specified disorders of bone density and structure, unspecified site: Secondary | ICD-10-CM | POA: Insufficient documentation

## 2017-07-14 DIAGNOSIS — G709 Myoneural disorder, unspecified: Secondary | ICD-10-CM | POA: Diagnosis not present

## 2017-07-14 DIAGNOSIS — I272 Pulmonary hypertension, unspecified: Secondary | ICD-10-CM | POA: Diagnosis not present

## 2017-07-14 DIAGNOSIS — R911 Solitary pulmonary nodule: Secondary | ICD-10-CM | POA: Diagnosis not present

## 2017-07-14 DIAGNOSIS — K219 Gastro-esophageal reflux disease without esophagitis: Secondary | ICD-10-CM | POA: Diagnosis not present

## 2017-07-14 DIAGNOSIS — N189 Chronic kidney disease, unspecified: Secondary | ICD-10-CM | POA: Insufficient documentation

## 2017-07-14 DIAGNOSIS — Z87891 Personal history of nicotine dependence: Secondary | ICD-10-CM | POA: Insufficient documentation

## 2017-07-14 DIAGNOSIS — Z90710 Acquired absence of both cervix and uterus: Secondary | ICD-10-CM | POA: Insufficient documentation

## 2017-07-14 DIAGNOSIS — F329 Major depressive disorder, single episode, unspecified: Secondary | ICD-10-CM | POA: Diagnosis not present

## 2017-07-14 DIAGNOSIS — M797 Fibromyalgia: Secondary | ICD-10-CM | POA: Insufficient documentation

## 2017-07-14 HISTORY — PX: CATARACT EXTRACTION W/PHACO: SHX586

## 2017-07-14 HISTORY — DX: Personal history of other diseases of the digestive system: Z87.19

## 2017-07-14 HISTORY — DX: Cardiac arrhythmia, unspecified: I49.9

## 2017-07-14 HISTORY — DX: Chronic kidney disease, unspecified: N18.9

## 2017-07-14 SURGERY — PHACOEMULSIFICATION, CATARACT, WITH IOL INSERTION
Anesthesia: Monitor Anesthesia Care | Site: Eye | Laterality: Right | Wound class: Clean

## 2017-07-14 MED ORDER — LIDOCAINE HCL (PF) 4 % IJ SOLN
INTRAMUSCULAR | Status: AC
  Filled 2017-07-14: qty 5

## 2017-07-14 MED ORDER — POVIDONE-IODINE 5 % OP SOLN
OPHTHALMIC | Status: AC
  Filled 2017-07-14: qty 30

## 2017-07-14 MED ORDER — MOXIFLOXACIN HCL 0.5 % OP SOLN: 1.0000 [drp] | OPHTHALMIC | Status: DC | PRN

## 2017-07-14 MED ORDER — MIDAZOLAM HCL 2 MG/2ML IJ SOLN
INTRAMUSCULAR | Status: AC
  Filled 2017-07-14: qty 2

## 2017-07-14 MED ORDER — MIDAZOLAM HCL 2 MG/2ML IJ SOLN
INTRAMUSCULAR | Status: DC | PRN
  Administered 2017-07-14 (×2): 1 mg via INTRAVENOUS

## 2017-07-14 MED ORDER — POVIDONE-IODINE 5 % OP SOLN
OPHTHALMIC | Status: DC | PRN
  Administered 2017-07-14: 1 via OPHTHALMIC

## 2017-07-14 MED ORDER — NA CHONDROIT SULF-NA HYALURON 40-17 MG/ML IO SOLN
INTRAOCULAR | Status: AC
  Filled 2017-07-14: qty 1

## 2017-07-14 MED ORDER — SODIUM CHLORIDE 0.9 % IV SOLN
INTRAVENOUS | Status: DC
  Administered 2017-07-14: 12:00:00 via INTRAVENOUS

## 2017-07-14 MED ORDER — ARMC OPHTHALMIC DILATING DROPS
1.0000 "application " | OPHTHALMIC | Status: AC
  Administered 2017-07-14 (×3): 1 via OPHTHALMIC

## 2017-07-14 MED ORDER — MOXIFLOXACIN HCL 0.5 % OP SOLN
OPHTHALMIC | Status: AC
  Filled 2017-07-14: qty 3

## 2017-07-14 MED ORDER — CARBACHOL 0.01 % IO SOLN
INTRAOCULAR | Status: DC | PRN
  Administered 2017-07-14: .5 mL via INTRAOCULAR

## 2017-07-14 MED ORDER — EPINEPHRINE PF 1 MG/ML IJ SOLN
INTRAOCULAR | Status: DC | PRN
  Administered 2017-07-14: 1 mL via OPHTHALMIC

## 2017-07-14 MED ORDER — NA CHONDROIT SULF-NA HYALURON 40-17 MG/ML IO SOLN
INTRAOCULAR | Status: DC | PRN
  Administered 2017-07-14: 1 mL via INTRAOCULAR

## 2017-07-14 MED ORDER — EPINEPHRINE PF 1 MG/ML IJ SOLN
INTRAMUSCULAR | Status: AC
  Filled 2017-07-14: qty 1

## 2017-07-14 MED ORDER — ARMC OPHTHALMIC DILATING DROPS
OPHTHALMIC | Status: AC
  Filled 2017-07-14: qty 0.4

## 2017-07-14 MED ORDER — LIDOCAINE HCL (PF) 4 % IJ SOLN
INTRAOCULAR | Status: DC | PRN
  Administered 2017-07-14: 2 mL via OPHTHALMIC

## 2017-07-14 SURGICAL SUPPLY — 16 items
GLOVE BIO SURGEON STRL SZ8 (GLOVE) ×3 IMPLANT
GLOVE BIOGEL M 6.5 STRL (GLOVE) ×3 IMPLANT
GLOVE SURG LX 8.0 MICRO (GLOVE) ×2
GLOVE SURG LX STRL 8.0 MICRO (GLOVE) ×1 IMPLANT
GOWN STRL REUS W/ TWL LRG LVL3 (GOWN DISPOSABLE) ×2 IMPLANT
GOWN STRL REUS W/TWL LRG LVL3 (GOWN DISPOSABLE) ×4
LABEL CATARACT MEDS ST (LABEL) ×3 IMPLANT
LENS IOL TECNIS ITEC 22.5 (Intraocular Lens) ×3 IMPLANT
PACK CATARACT (MISCELLANEOUS) ×3 IMPLANT
PACK CATARACT BRASINGTON LX (MISCELLANEOUS) ×3 IMPLANT
PACK EYE AFTER SURG (MISCELLANEOUS) ×3 IMPLANT
SOL BSS BAG (MISCELLANEOUS) ×3
SOLUTION BSS BAG (MISCELLANEOUS) ×1 IMPLANT
SYR 5ML LL (SYRINGE) ×3 IMPLANT
WATER STERILE IRR 250ML POUR (IV SOLUTION) ×3 IMPLANT
WIPE NON LINTING 3.25X3.25 (MISCELLANEOUS) ×3 IMPLANT

## 2017-07-14 NOTE — Op Note (Signed)
PREOPERATIVE DIAGNOSIS:  Nuclear sclerotic cataract of the right eye.   POSTOPERATIVE DIAGNOSIS:  nuclear sclerotic cataract right eye   OPERATIVE PROCEDURE: Procedure(s): CATARACT EXTRACTION PHACO AND INTRAOCULAR LENS PLACEMENT (IOC)   SURGEON:  Birder Robson, MD.   ANESTHESIA:  Anesthesiologist: Piscitello, Precious Haws, MD CRNA: Aline Brochure, CRNA  1.      Managed anesthesia care. 2.      0.64ml of Shugarcaine was instilled in the eye following the paracentesis.   COMPLICATIONS:  None.   TECHNIQUE:   Stop and chop   DESCRIPTION OF PROCEDURE:  The patient was examined and consented in the preoperative holding area where the aforementioned topical anesthesia was applied to the right eye and then brought back to the Operating Room where the right eye was prepped and draped in the usual sterile ophthalmic fashion and a lid speculum was placed. A paracentesis was created with the side port blade and the anterior chamber was filled with viscoelastic. A near clear corneal incision was performed with the steel keratome. A continuous curvilinear capsulorrhexis was performed with a cystotome followed by the capsulorrhexis forceps. Hydrodissection and hydrodelineation were carried out with BSS on a blunt cannula. The lens was removed in a stop and chop  technique and the remaining cortical material was removed with the irrigation-aspiration handpiece. The capsular bag was inflated with viscoelastic and the Technis ZCB00  lens was placed in the capsular bag without complication. The remaining viscoelastic was removed from the eye with the irrigation-aspiration handpiece. The wounds were hydrated. The anterior chamber was flushed with Miostat and the eye was inflated to physiologic pressure. 0.21ml of Vigamox was placed in the anterior chamber. The wounds were found to be water tight. The eye was dressed with Vigamox. The patient was given protective glasses to wear throughout the day and a shield with  which to sleep tonight. The patient was also given drops with which to begin a drop regimen today and will follow-up with me in one day.  Implant Name Type Inv. Item Serial No. Manufacturer Lot No. LRB No. Used  LENS IOL DIOP 22.5 - [id number] Intraocular Lens LENS IOL DIOP 22.5 [id number] AMO   Right 1   Procedure(s) with comments: CATARACT EXTRACTION PHACO AND INTRAOCULAR LENS PLACEMENT (IOC) (Right) - [place] 00:31 AP% 15.7 CDE 4.97 Fluid pack lot # [id number]  Electronically signed: [place] 07/14/2017 11:52 AM

## 2017-07-14 NOTE — Transfer of Care (Signed)
Immediate Anesthesia Transfer of Care Note  Patient: Mary Gordon  Procedure(s) Performed: Procedure(s) with comments: CATARACT EXTRACTION PHACO AND INTRAOCULAR LENS PLACEMENT (IOC) (Right) - [place] 00:31 AP% 15.7 CDE 4.97 Fluid pack lot # [id number]  Patient Location: Short Stay  Anesthesia Type:MAC  Level of Consciousness: awake, alert  and oriented  Airway & Oxygen Therapy: Patient Spontanous Breathing  Post-op Assessment: Post -op Vital signs reviewed and stable  Post vital signs: stable  Last Vitals:  Vitals:   07/14/17 1038 07/14/17 1152  BP: (!) 110/55 (!) 95/48  Pulse: 67 (!) 58  Resp: 16 12  Temp: 36.7 C 36.7 C    Last Pain:  Vitals:   07/14/17 1152  TempSrc: Oral         Complications: No apparent anesthesia complications

## 2017-07-14 NOTE — H&P (Signed)
All labs reviewed. Abnormal studies sent to patients PCP when indicated.  Previous H&P reviewed, patient examined, there are NO CHANGES.  Mary Gordon LOUIS8/7/201811:25 AM

## 2017-07-14 NOTE — Discharge Instructions (Signed)
Eye Surgery Discharge Instructions  Expect mild scratchy sensation or mild soreness. DO NOT RUB YOUR EYE!  The day of surgery:  Minimal physical activity, but bed rest is not required  No reading, computer work, or close hand work  No bending, lifting, or straining.  May watch TV  For 24 hours:  No driving, legal decisions, or alcoholic beverages  Safety precautions  Eat anything you prefer: It is better to start with liquids, then soup then solid foods.  _____ Eye patch should be worn until postoperative exam tomorrow.  ____ Solar shield eyeglasses should be worn for comfort in the sunlight/patch while sleeping  Resume all regular medications including aspirin or Coumadin if these were discontinued prior to surgery. You may shower, bathe, shave, or wash your hair. Tylenol may be taken for mild discomfort.  Call your doctor if you experience significant pain, nausea, or vomiting, fever > 101 or other signs of infection. [phone] or [phone] Specific instructions:  Follow-up Information    Birder Robson, MD Follow up.   Specialty:  Ophthalmology Why:  August 8 at 8:30am Contact information: [address] [place] [address] [phone]

## 2017-07-14 NOTE — Anesthesia Postprocedure Evaluation (Signed)
Anesthesia Post Note  Patient: Mary Gordon  Procedure(s) Performed: Procedure(s) (LRB): CATARACT EXTRACTION PHACO AND INTRAOCULAR LENS PLACEMENT (IOC) (Right)  Patient location during evaluation: Short Stay Anesthesia Type: MAC Level of consciousness: awake and alert Pain management: pain level controlled Vital Signs Assessment: post-procedure vital signs reviewed and stable Respiratory status: spontaneous breathing, nonlabored ventilation, respiratory function stable and patient connected to nasal cannula oxygen Cardiovascular status: stable and blood pressure returned to baseline Anesthetic complications: no     Last Vitals:  Vitals:   07/14/17 1038 07/14/17 1152  BP: (!) 110/55 (!) 95/48  Pulse: 67 (!) 55  Resp: 16 16  Temp: 36.7 C 36.7 C    Last Pain:  Vitals:   07/14/17 1152  TempSrc: Oral                 Estill Batten

## 2017-07-14 NOTE — Anesthesia Post-op Follow-up Note (Signed)
Anesthesia QCDR form completed.        

## 2017-07-14 NOTE — Anesthesia Preprocedure Evaluation (Signed)
Anesthesia Evaluation  Patient identified by MRN, date of birth, ID band Patient awake    Reviewed: Allergy & Precautions, H&P , NPO status , Patient's Chart, lab work & pertinent test results  Airway Mallampati: III  TM Distance: <3 FB Neck ROM: limited    Dental  (+) Poor Dentition, Chipped, Missing   Pulmonary neg shortness of breath, former smoker,           Cardiovascular Exercise Tolerance: Good + DOE  + dysrhythmias      Neuro/Psych PSYCHIATRIC DISORDERS Anxiety Depression  Neuromuscular disease negative psych ROS   GI/Hepatic Neg liver ROS, hiatal hernia, GERD  Controlled,  Endo/Other  negative endocrine ROS  Renal/GU Renal disease     Musculoskeletal  (+) Fibromyalgia -  Abdominal   Peds  Hematology negative hematology ROS (+)   Anesthesia Other Findings Past Medical History: No date: Allergy     Comment:  all year No date: Anxiety No date: Bursitis of hip No date: Cataract 1978: Cervical cancer ([place]) No date: Chronic kidney disease     Comment:  RENAL INSUFF No date: Depression No date: Dysrhythmia     Comment:  PVC'S No date: Fibromyalgia No date: GERD (gastroesophageal reflux disease) No date: High cholesterol No date: History of Clostridium difficile infection     Comment:  In past from augmentin No date: History of hiatal hernia No date: Osteopenia No date: Palpitations No date: Panic disorder 10/20/2014: Pulmonary HTN ([facility])     Comment:  moderate with PASP 68mHg No date: Pulmonary nodule     Comment:  Being observed No date: Vitamin D deficiency  Past Surgical History: 1978: ABDOMINAL HYSTERECTOMY No date: CARDIAC CATHETERIZATION 2002: COLONOSCOPY     Comment:  Normal 2005: HAND SURGERY     Comment:  Left hand thrombosis 2007: NASAL HEMORRHAGE CONTROL 03/16/2015: RIGHT HEART CATHETERIZATION; N/A     Comment:  Procedure: RIGHT HEART CATH;  Surgeon: DLarey Dresser   MD;  [facility];  Service: Cardiovascular;                Laterality: N/A; 2008: SHOULDER SURGERY     Comment:  Right shoulder bone spur  BMI    Body Mass Index:  27.12 kg/m      Reproductive/Obstetrics negative OB ROS                             Anesthesia Physical Anesthesia Plan  ASA: III  Anesthesia Plan: MAC   Post-op Pain Management:    Induction: Intravenous  PONV Risk Score and Plan:   Airway Management Planned: Natural Airway and Nasal Cannula  Additional Equipment:   Intra-op Plan:   Post-operative Plan:   Informed Consent: I have reviewed the patients History and Physical, chart, labs and discussed the procedure including the risks, benefits and alternatives for the proposed anesthesia with the patient or authorized representative who has indicated his/her understanding and acceptance.   Dental Advisory Given  Plan Discussed with: Anesthesiologist, CRNA and Surgeon  Anesthesia Plan Comments: (Patient consented for risks of anesthesia including but not limited to:  - adverse reactions to medications - damage to teeth, lips or other oral mucosa - sore throat or hoarseness - Damage to heart, brain, lungs or loss of life  Patient voiced understanding.)        Anesthesia Quick Evaluation

## 2017-07-29 DIAGNOSIS — H2512 Age-related nuclear cataract, left eye: Secondary | ICD-10-CM | POA: Diagnosis not present

## 2017-07-30 ENCOUNTER — Encounter: Payer: Self-pay | Admitting: *Deleted

## 2017-08-04 ENCOUNTER — Encounter: Payer: Self-pay | Admitting: *Deleted

## 2017-08-04 ENCOUNTER — Ambulatory Visit
Admission: RE | Admit: 2017-08-04 | Discharge: 2017-08-04 | Disposition: A | Discharge status: Home / Self Care | Payer: PPO | Source: Ambulatory Visit | Attending: Ophthalmology | Admitting: Ophthalmology

## 2017-08-04 ENCOUNTER — Ambulatory Visit: Payer: PPO | Admitting: Anesthesiology

## 2017-08-04 ENCOUNTER — Encounter
Admission: RE | Disposition: A | Discharge status: Home / Self Care | Payer: Self-pay | Source: Ambulatory Visit | Attending: Ophthalmology

## 2017-08-04 DIAGNOSIS — F329 Major depressive disorder, single episode, unspecified: Secondary | ICD-10-CM | POA: Insufficient documentation

## 2017-08-04 DIAGNOSIS — I493 Ventricular premature depolarization: Secondary | ICD-10-CM | POA: Insufficient documentation

## 2017-08-04 DIAGNOSIS — Z86718 Personal history of other venous thrombosis and embolism: Secondary | ICD-10-CM | POA: Diagnosis not present

## 2017-08-04 DIAGNOSIS — K449 Diaphragmatic hernia without obstruction or gangrene: Secondary | ICD-10-CM | POA: Insufficient documentation

## 2017-08-04 DIAGNOSIS — H2512 Age-related nuclear cataract, left eye: Secondary | ICD-10-CM | POA: Diagnosis not present

## 2017-08-04 DIAGNOSIS — Z90710 Acquired absence of both cervix and uterus: Secondary | ICD-10-CM | POA: Diagnosis not present

## 2017-08-04 DIAGNOSIS — F418 Other specified anxiety disorders: Secondary | ICD-10-CM | POA: Insufficient documentation

## 2017-08-04 DIAGNOSIS — K219 Gastro-esophageal reflux disease without esophagitis: Secondary | ICD-10-CM | POA: Diagnosis not present

## 2017-08-04 DIAGNOSIS — Z881 Allergy status to other antibiotic agents status: Secondary | ICD-10-CM | POA: Diagnosis not present

## 2017-08-04 DIAGNOSIS — F419 Anxiety disorder, unspecified: Secondary | ICD-10-CM | POA: Diagnosis not present

## 2017-08-04 HISTORY — PX: CATARACT EXTRACTION W/PHACO: SHX586

## 2017-08-04 SURGERY — PHACOEMULSIFICATION, CATARACT, WITH IOL INSERTION
Anesthesia: Monitor Anesthesia Care | Site: Eye | Laterality: Left | Wound class: Clean

## 2017-08-04 MED ORDER — FENTANYL CITRATE (PF) 100 MCG/2ML IJ SOLN
INTRAMUSCULAR | Status: AC
  Filled 2017-08-04: qty 2

## 2017-08-04 MED ORDER — FENTANYL CITRATE (PF) 100 MCG/2ML IJ SOLN
INTRAMUSCULAR | Status: DC | PRN
  Administered 2017-08-04: 50 ug via INTRAVENOUS

## 2017-08-04 MED ORDER — POVIDONE-IODINE 5 % OP SOLN
OPHTHALMIC | Status: DC | PRN
Start: 2017-08-04 — End: 2017-08-04
  Administered 2017-08-04: 1 via OPHTHALMIC

## 2017-08-04 MED ORDER — LACTATED RINGERS IV SOLN: INTRAVENOUS | Status: DC

## 2017-08-04 MED ORDER — MOXIFLOXACIN HCL 0.5 % OP SOLN: 1.0000 [drp] | OPHTHALMIC | Status: DC | PRN

## 2017-08-04 MED ORDER — CARBACHOL 0.01 % IO SOLN
INTRAOCULAR | Status: DC | PRN
  Administered 2017-08-04: .5 mL via INTRAOCULAR

## 2017-08-04 MED ORDER — SODIUM CHLORIDE 0.9 % IV SOLN
INTRAVENOUS | Status: DC
  Administered 2017-08-04: 10:00:00 via INTRAVENOUS

## 2017-08-04 MED ORDER — MIDAZOLAM HCL 2 MG/2ML IJ SOLN
INTRAMUSCULAR | Status: DC | PRN
  Administered 2017-08-04 (×2): 1 mg via INTRAVENOUS

## 2017-08-04 MED ORDER — POVIDONE-IODINE 5 % OP SOLN
OPHTHALMIC | Status: AC
  Filled 2017-08-04: qty 30

## 2017-08-04 MED ORDER — NA CHONDROIT SULF-NA HYALURON 40-17 MG/ML IO SOLN
INTRAOCULAR | Status: AC
  Filled 2017-08-04: qty 1

## 2017-08-04 MED ORDER — LIDOCAINE HCL (PF) 4 % IJ SOLN
INTRAMUSCULAR | Status: AC
  Filled 2017-08-04: qty 5

## 2017-08-04 MED ORDER — MIDAZOLAM HCL 2 MG/2ML IJ SOLN
INTRAMUSCULAR | Status: AC
  Filled 2017-08-04: qty 2

## 2017-08-04 MED ORDER — LIDOCAINE HCL (PF) 4 % IJ SOLN
INTRAMUSCULAR | Status: DC | PRN
  Administered 2017-08-04: 2 mL via OPHTHALMIC

## 2017-08-04 MED ORDER — ARMC OPHTHALMIC DILATING DROPS
1.0000 "application " | OPHTHALMIC | Status: AC
  Administered 2017-08-04 (×3): 1 via OPHTHALMIC

## 2017-08-04 MED ORDER — EPINEPHRINE PF 1 MG/ML IJ SOLN
INTRAMUSCULAR | Status: DC | PRN
  Administered 2017-08-04: 1 mL via OPHTHALMIC

## 2017-08-04 MED ORDER — NA CHONDROIT SULF-NA HYALURON 40-17 MG/ML IO SOLN
INTRAOCULAR | Status: DC | PRN
  Administered 2017-08-04: 1 mL via INTRAOCULAR

## 2017-08-04 MED ORDER — MOXIFLOXACIN HCL 0.5 % OP SOLN
OPHTHALMIC | Status: DC | PRN
  Administered 2017-08-04: .2 mL via OPHTHALMIC

## 2017-08-04 MED ORDER — EPINEPHRINE PF 1 MG/ML IJ SOLN
INTRAMUSCULAR | Status: AC
  Filled 2017-08-04: qty 1

## 2017-08-04 SURGICAL SUPPLY — 16 items
GLOVE BIO SURGEON STRL SZ8 (GLOVE) ×3 IMPLANT
GLOVE BIOGEL M 6.5 STRL (GLOVE) ×3 IMPLANT
GLOVE SURG LX 8.0 MICRO (GLOVE) ×2
GLOVE SURG LX STRL 8.0 MICRO (GLOVE) ×1 IMPLANT
GOWN STRL REUS W/ TWL LRG LVL3 (GOWN DISPOSABLE) ×2 IMPLANT
GOWN STRL REUS W/TWL LRG LVL3 (GOWN DISPOSABLE) ×4
LABEL CATARACT MEDS ST (LABEL) ×3 IMPLANT
LENS IOL TECNIS ITEC 24.0 (Intraocular Lens) ×3 IMPLANT
PACK CATARACT (MISCELLANEOUS) ×3 IMPLANT
PACK CATARACT BRASINGTON LX (MISCELLANEOUS) ×3 IMPLANT
PACK EYE AFTER SURG (MISCELLANEOUS) ×3 IMPLANT
SOL BSS BAG (MISCELLANEOUS) ×3
SOLUTION BSS BAG (MISCELLANEOUS) ×1 IMPLANT
SYR 5ML LL (SYRINGE) ×3 IMPLANT
WATER STERILE IRR 250ML POUR (IV SOLUTION) ×3 IMPLANT
WIPE NON LINTING 3.25X3.25 (MISCELLANEOUS) ×3 IMPLANT

## 2017-08-04 NOTE — Anesthesia Procedure Notes (Signed)
Procedure Name: MAC Date/Time: 08/04/2017 11:05 AM Performed by: Hedda Slade Pre-anesthesia Checklist: Patient identified, Emergency Drugs available, Suction available and Patient being monitored Oxygen Delivery Method: Nasal cannula

## 2017-08-04 NOTE — Anesthesia Postprocedure Evaluation (Signed)
Anesthesia Post Note  Patient: Mary Gordon  Procedure(s) Performed: Procedure(s) (LRB): CATARACT EXTRACTION PHACO AND INTRAOCULAR LENS PLACEMENT (IOC) (Left)  Patient location during evaluation: PACU Anesthesia Type: MAC Level of consciousness: awake, awake and alert and oriented Pain management: pain level controlled Vital Signs Assessment: post-procedure vital signs reviewed and stable Respiratory status: spontaneous breathing Cardiovascular status: blood pressure returned to baseline Postop Assessment: no signs of nausea or vomiting Anesthetic complications: no     Last Vitals:  Vitals:   08/04/17 0958 08/04/17 1124  BP: (!) 107/53   Pulse: 69 63  Resp: 16 15  Temp: 36.8 C 36.6 C  SpO2: 97% 98%    Last Pain:  Vitals:   08/04/17 1124  TempSrc: Temporal                 Symphonie Schneiderman Lorenza Chick

## 2017-08-04 NOTE — Transfer of Care (Signed)
Immediate Anesthesia Transfer of Care Note  Patient: Mary Gordon  Procedure(s) Performed: Procedure(s) with comments: CATARACT EXTRACTION PHACO AND INTRAOCULAR LENS PLACEMENT (IOC) (Left) - [place] 00:34 AP% 18.7 CDE 6.42 Fluid pack lot # [id number]  Patient Location: PACU  Anesthesia Type:MAC  Level of Consciousness: awake, alert  and oriented  Airway & Oxygen Therapy: Patient Spontanous Breathing  Post-op Assessment: Report given to RN and Post -op Vital signs reviewed and stable  Post vital signs: Reviewed and stable  Last Vitals:  Vitals:   08/04/17 0958 08/04/17 1124  BP: (!) 107/53   Pulse: 69 63  Resp: 16 15  Temp: 36.8 C 36.6 C  SpO2: 97% 98%    Last Pain:  Vitals:   08/04/17 1124  TempSrc: Temporal         Complications: No apparent anesthesia complications

## 2017-08-04 NOTE — Discharge Instructions (Signed)
Eye Surgery Discharge Instructions  Expect mild scratchy sensation or mild soreness. DO NOT RUB YOUR EYE!  The day of surgery:  Minimal physical activity, but bed rest is not required  No reading, computer work, or close hand work  No bending, lifting, or straining.  May watch TV  For 24 hours:  No driving, legal decisions, or alcoholic beverages  Safety precautions  Eat anything you prefer: It is better to start with liquids, then soup then solid foods.  _____ Eye patch should be worn until postoperative exam tomorrow.  __x__ Solar shield eyeglasses should be worn for comfort in the sunlight/patch while sleeping  Resume all regular medications including aspirin or Coumadin if these were discontinued prior to surgery. You may shower, bathe, shave, or wash your hair. Tylenol may be taken for mild discomfort.  Call your doctor if you experience significant pain, nausea, or vomiting, fever > 101 or other signs of infection. [phone] or [phone] Specific instructions:  Follow-up Information    Birder Robson, MD Follow up on 08/05/2017.   Specialty:  Ophthalmology Why:  follow up appointment in the office at 9:40 am. Contact information: [place] [address] [phone]

## 2017-08-04 NOTE — Op Note (Signed)
PREOPERATIVE DIAGNOSIS:  Nuclear sclerotic cataract of the left eye.   POSTOPERATIVE DIAGNOSIS:  Nuclear sclerotic cataract of the left eye.   OPERATIVE PROCEDURE: Procedure(s): CATARACT EXTRACTION PHACO AND INTRAOCULAR LENS PLACEMENT (IOC)   SURGEON:  Birder Robson, MD.   ANESTHESIA:  Anesthesiologist: Gunnar Fusi, MD CRNA: Hedda Slade, CRNA  1.      Managed anesthesia care. 2.     0.5ml of Shugarcaine was instilled following the paracentesis   COMPLICATIONS:  None.   TECHNIQUE:   Stop and chop   DESCRIPTION OF PROCEDURE:  The patient was examined and consented in the preoperative holding area where the aforementioned topical anesthesia was applied to the left eye and then brought back to the Operating Room where the left eye was prepped and draped in the usual sterile ophthalmic fashion and a lid speculum was placed. A paracentesis was created with the side port blade and the anterior chamber was filled with viscoelastic. A near clear corneal incision was performed with the steel keratome. A continuous curvilinear capsulorrhexis was performed with a cystotome followed by the capsulorrhexis forceps. Hydrodissection and hydrodelineation were carried out with BSS on a blunt cannula. The lens was removed in a stop and chop  technique and the remaining cortical material was removed with the irrigation-aspiration handpiece. The capsular bag was inflated with viscoelastic and the Technis ZCB00 lens was placed in the capsular bag without complication. The remaining viscoelastic was removed from the eye with the irrigation-aspiration handpiece. The wounds were hydrated. The anterior chamber was flushed with Miostat and the eye was inflated to physiologic pressure. 0.38ml Vigamox was placed in the anterior chamber. The wounds were found to be water tight. The eye was dressed with Vigamox. The patient was given protective glasses to wear throughout the day and a shield with which to sleep  tonight. The patient was also given drops with which to begin a drop regimen today and will follow-up with me in one day.  Implant Name Type Inv. Item Serial No. Manufacturer Lot No. LRB No. Used  LENS IOL DIOP 24.0 - [id number] Intraocular Lens LENS IOL DIOP 24.0 [id number] AMO   Left 1    Procedure(s) with comments: CATARACT EXTRACTION PHACO AND INTRAOCULAR LENS PLACEMENT (IOC) (Left) - [place] 00:34 AP% 18.7 CDE 6.42 Fluid pack lot # [id number]  Electronically signed: Linsie Lupo LOUIS 08/04/2017 11:22 AM

## 2017-08-04 NOTE — Progress Notes (Signed)
CH made an initial visit with Pt in SDS23. Pt is awaiting cataract surgery. Pt is in good spirits and excited about her procedure. . [place] spent some time in conversation as she awaited her procedure. [place] will follow up post op.    08/04/17 1000  Clinical Encounter Type  Visited With Patient;Health care provider  Visit Type Initial;Spiritual support;Pre-op  Spiritual Encounters  Spiritual Needs Emotional

## 2017-08-04 NOTE — Anesthesia Preprocedure Evaluation (Signed)
Anesthesia Evaluation  Patient identified by MRN, date of birth, ID band Patient awake    Reviewed: Allergy & Precautions, NPO status , Patient's Chart, lab work & pertinent test results, reviewed documented beta blocker date and time   History of Anesthesia Complications Negative for: history of anesthetic complications  Airway Mallampati: II       Dental   Pulmonary neg sleep apnea, neg COPD, former smoker,           Cardiovascular + dysrhythmias (pvcs)      Neuro/Psych Anxiety Depression    GI/Hepatic Neg liver ROS, hiatal hernia, GERD  Medicated and Poorly Controlled,  Endo/Other  neg diabetes  Renal/GU Renal InsufficiencyRenal disease     Musculoskeletal   Abdominal   Peds  Hematology   Anesthesia Other Findings   Reproductive/Obstetrics                             Anesthesia Physical Anesthesia Plan  ASA: III  Anesthesia Plan: MAC   Post-op Pain Management:    Induction:   PONV Risk Score and Plan:   Airway Management Planned:   Additional Equipment:   Intra-op Plan:   Post-operative Plan:   Informed Consent: I have reviewed the patients History and Physical, chart, labs and discussed the procedure including the risks, benefits and alternatives for the proposed anesthesia with the patient or authorized representative who has indicated his/her understanding and acceptance.     Plan Discussed with:   Anesthesia Plan Comments:         Anesthesia Quick Evaluation

## 2017-08-04 NOTE — Anesthesia Post-op Follow-up Note (Signed)
Anesthesia QCDR form completed.        

## 2017-08-04 NOTE — H&P (Signed)
All labs reviewed. Abnormal studies sent to patients PCP when indicated.  Previous H&P reviewed, patient examined, there are NO CHANGES.  Makhia Vosler LOUIS8/28/201810:58 AM

## 2017-08-05 ENCOUNTER — Encounter: Payer: Self-pay | Admitting: Ophthalmology

## 2017-08-16 ENCOUNTER — Telehealth: Payer: Self-pay | Admitting: Family Medicine

## 2017-08-16 DIAGNOSIS — E559 Vitamin D deficiency, unspecified: Secondary | ICD-10-CM

## 2017-08-16 DIAGNOSIS — Z0000 Encounter for general adult medical examination without abnormal findings: Secondary | ICD-10-CM

## 2017-08-16 DIAGNOSIS — R739 Hyperglycemia, unspecified: Secondary | ICD-10-CM

## 2017-08-16 DIAGNOSIS — E7800 Pure hypercholesterolemia, unspecified: Secondary | ICD-10-CM

## 2017-08-16 NOTE — Telephone Encounter (Signed)
-----   Message from Eustace Pen, LPN sent at 02/11/1223  4:20 PM EDT ----- Regarding: labs 9/12 Please place lab orders.  [employer]

## 2017-08-17 ENCOUNTER — Other Ambulatory Visit (HOSPITAL_COMMUNITY): Payer: Self-pay | Admitting: Cardiology

## 2017-08-19 ENCOUNTER — Ambulatory Visit (INDEPENDENT_AMBULATORY_CARE_PROVIDER_SITE_OTHER): Payer: PPO

## 2017-08-19 VITALS — BP 102/68 | HR 71 | Temp 98.4°F | Ht 63.75 in | Wt 161.8 lb

## 2017-08-19 DIAGNOSIS — R739 Hyperglycemia, unspecified: Secondary | ICD-10-CM | POA: Diagnosis not present

## 2017-08-19 DIAGNOSIS — Z0000 Encounter for general adult medical examination without abnormal findings: Secondary | ICD-10-CM

## 2017-08-19 DIAGNOSIS — E559 Vitamin D deficiency, unspecified: Secondary | ICD-10-CM

## 2017-08-19 DIAGNOSIS — Z23 Encounter for immunization: Secondary | ICD-10-CM | POA: Diagnosis not present

## 2017-08-19 DIAGNOSIS — E7800 Pure hypercholesterolemia, unspecified: Secondary | ICD-10-CM | POA: Diagnosis not present

## 2017-08-19 LAB — COMPREHENSIVE METABOLIC PANEL
ALK PHOS: 81 U/L (ref 39–117)
ALT: 16 U/L (ref 0–35)
AST: 17 U/L (ref 0–37)
Albumin: 4.1 g/dL (ref 3.5–5.2)
BUN: 8 mg/dL (ref 6–23)
CALCIUM: 9.4 mg/dL (ref 8.4–10.5)
CO2: 28 mEq/L (ref 19–32)
Chloride: 103 mEq/L (ref 96–112)
Creatinine, Ser: 0.83 mg/dL (ref 0.40–1.20)
GFR: 72.04 mL/min (ref >60.00)
GLUCOSE: 103 mg/dL — AB (ref 70–99)
POTASSIUM: 4.5 meq/L (ref 3.5–5.1)
Sodium: 139 mEq/L (ref 135–145)
TOTAL PROTEIN: 6.5 g/dL (ref 6.0–8.3)
Total Bilirubin: 0.8 mg/dL (ref 0.2–1.2)

## 2017-08-19 LAB — CBC WITH DIFFERENTIAL/PLATELET
BASOS ABS: 0 10*3/uL (ref 0.0–0.1)
Basophils Relative: 0.5 % (ref 0.0–3.0)
EOS PCT: 3.4 % (ref 0.0–5.0)
Eosinophils Absolute: 0.2 10*3/uL (ref 0.0–0.7)
HEMATOCRIT: 38.6 % (ref 36.0–46.0)
Hemoglobin: 12.4 g/dL (ref 12.0–15.0)
LYMPHS ABS: 1.7 10*3/uL (ref 0.7–4.0)
LYMPHS PCT: 33.9 % (ref 12.0–46.0)
MCHC: 32.1 g/dL (ref 30.0–36.0)
MCV: 87.3 fl (ref 78.0–100.0)
MONOS PCT: 5.8 % (ref 3.0–12.0)
Monocytes Absolute: 0.3 10*3/uL (ref 0.1–1.0)
NEUTROS ABS: 2.9 10*3/uL (ref 1.4–7.7)
NEUTROS PCT: 56.4 % (ref 43.0–77.0)
Platelets: 291 10*3/uL (ref 150.0–400.0)
RBC: 4.43 Mil/uL (ref 3.87–5.11)
RDW: 13.7 % (ref 11.5–15.5)
WBC: 5.1 10*3/uL (ref 4.0–10.5)

## 2017-08-19 LAB — HEMOGLOBIN A1C: Hgb A1c MFr Bld: 5.4 % (ref 4.6–6.5)

## 2017-08-19 LAB — LIPID PANEL
CHOLESTEROL: 256 mg/dL — AB (ref 0–200)
HDL: 87.6 mg/dL (ref >39.00)
LDL Cholesterol: 150 mg/dL — ABNORMAL HIGH (ref 0–99)
NONHDL: 168.25
TRIGLYCERIDES: 89 mg/dL (ref 0.0–149.0)
Total CHOL/HDL Ratio: 3
VLDL: 17.8 mg/dL (ref 0.0–40.0)

## 2017-08-19 LAB — TSH: TSH: 1.37 u[IU]/mL (ref 0.35–4.50)

## 2017-08-19 LAB — VITAMIN D 25 HYDROXY (VIT D DEFICIENCY, FRACTURES): VITD: 61.98 ng/mL (ref 30.00–100.00)

## 2017-08-19 NOTE — Progress Notes (Signed)
PCP notes:   Health maintenance:  Flu vaccine - administered PPSV23 - administered  Abnormal screenings:   Hearing - failed  Patient concerns:   None  Nurse concerns:  None  Next PCP appt:   08/26/17 @ 1030  I reviewed health advisor's note, was available for consultation, and agree with documentation and plan.  Loura Pardon MD

## 2017-08-19 NOTE — Progress Notes (Signed)
Pre visit review using our clinic review tool, if applicable. No additional management support is needed unless otherwise documented below in the visit note. 

## 2017-08-19 NOTE — Patient Instructions (Signed)
Mary Gordon , Thank you for taking time to come for your Medicare Wellness Visit. I appreciate your ongoing commitment to your health goals. Please review the following plan we discussed and let me know if I can assist you in the future.   These are the goals we discussed: Goals    . Increase physical activity          Starting 08/19/2017, I will continue to exercise for at least 120 min 2 days per week.        This is a list of the screening recommended for you and due dates:  Health Maintenance  Topic Date Due  . Mammogram  02/03/2019  . Tetanus Vaccine  08/09/2019  . Colon Cancer Screening  10/15/2021  . Flu Shot  Completed  . DEXA scan (bone density measurement)  Completed  .  Hepatitis C: One time screening is recommended by Center for Disease Control  (CDC) for  adults born from 18 through 1965.   Completed  . Pneumonia vaccines  Completed   Preventive Care for Adults  A healthy lifestyle and preventive care can promote health and wellness. Preventive health guidelines for adults include the following key practices.  . A routine yearly physical is a good way to check with your health care provider about your health and preventive screening. It is a chance to share any concerns and updates on your health and to receive a thorough exam.  . Visit your dentist for a routine exam and preventive care every 6 months. Brush your teeth twice a day and floss once a day. Good oral hygiene prevents tooth decay and gum disease.  . The frequency of eye exams is based on your age, health, family medical history, use  of contact lenses, and other factors. Follow your health care provider's ecommendations for frequency of eye exams.  . Eat a healthy diet. Foods like vegetables, fruits, whole grains, low-fat dairy products, and lean protein foods contain the nutrients you need without too many calories. Decrease your intake of foods high in solid fats, added sugars, and salt. Eat the right  amount of calories for you. Get information about a proper diet from your health care provider, if necessary.  . Regular physical exercise is one of the most important things you can do for your health. Most adults should get at least 150 minutes of moderate-intensity exercise (any activity that increases your heart rate and causes you to sweat) each week. In addition, most adults need muscle-strengthening exercises on 2 or more days a week.  Silver Sneakers may be a benefit available to you. To determine eligibility, you may visit the website: www.silversneakers.com or contact program at (970)586-3546 Mon-Fri between 8AM-8PM.   . Maintain a healthy weight. The body mass index (BMI) is a screening tool to identify possible weight problems. It provides an estimate of body fat based on height and weight. Your health care provider can find your BMI and can help you achieve or maintain a healthy weight.   For adults 20 years and older: ? A BMI below 18.5 is considered underweight. ? A BMI of 18.5 to 24.9 is normal. ? A BMI of 25 to 29.9 is considered overweight. ? A BMI of 30 and above is considered obese.   . Maintain normal blood lipids and cholesterol levels by exercising and minimizing your intake of saturated fat. Eat a balanced diet with plenty of fruit and vegetables. Blood tests for lipids and cholesterol should begin at age  20 and be repeated every 5 years. If your lipid or cholesterol levels are high, you are over 50, or you are at high risk for heart disease, you may need your cholesterol levels checked more frequently. Ongoing high lipid and cholesterol levels should be treated with medicines if diet and exercise are not working.  . If you smoke, find out from your health care provider how to quit. If you do not use tobacco, please do not start.  . If you choose to drink alcohol, please do not consume more than 2 drinks per day. One drink is considered to be 12 ounces (355 mL) of beer, 5  ounces (148 mL) of wine, or 1.5 ounces (44 mL) of liquor.  . If you are 61-32 years old, ask your health care provider if you should take aspirin to prevent strokes.  . Use sunscreen. Apply sunscreen liberally and repeatedly throughout the day. You should seek shade when your shadow is shorter than you. Protect yourself by wearing long sleeves, pants, a wide-brimmed hat, and sunglasses year round, whenever you are outdoors.  . Once a month, do a whole body skin exam, using a mirror to look at the skin on your back. Tell your health care provider of new moles, moles that have irregular borders, moles that are larger than a pencil eraser, or moles that have changed in shape or color.

## 2017-08-19 NOTE — Progress Notes (Signed)
Subjective:   Mary Gordon is a 71 y.o. female who presents for Medicare Annual (Subsequent) preventive examination.  Review of Systems:  N/A Cardiac Risk Factors include: advanced age (>45men, >74 women);dyslipidemia     Objective:     Vitals: BP 102/68 (BP Location: Right Arm, Patient Position: Sitting, Cuff Size: Normal)   Pulse 71   Temp 98.4 F (36.9 C) (Oral)   Ht 5' 3.75" (1.619 m) Comment: no shoes  Wt 161 lb 12 oz (73.4 kg)   SpO2 97%   BMI 27.98 kg/m   Body mass index is 27.98 kg/m.   Tobacco History  Smoking Status  . Former Smoker  . Packs/day: 1.00  . Years: 17.00  . Types: Cigarettes  . Quit date: 12/08/1978  Smokeless Tobacco  . Never Used     Counseling given: No   Past Medical History:  Diagnosis Date  . Allergy    all year  . Anxiety   . Bursitis of hip   . Cataract   . Cervical cancer ([place]) 1978  . Chronic kidney disease    RENAL INSUFF  . Depression   . Dysrhythmia    PVC'S  . Fibromyalgia   . GERD (gastroesophageal reflux disease)   . High cholesterol   . History of Clostridium difficile infection    In past from augmentin  . History of hiatal hernia   . Osteopenia   . Palpitations   . Panic disorder   . Pulmonary HTN ([place]) 10/20/2014   moderate with PASP 5mmHg  . Pulmonary nodule    Being observed  . Vitamin D deficiency    Past Surgical History:  Procedure Laterality Date  . ABDOMINAL HYSTERECTOMY  1978  . CARDIAC CATHETERIZATION    . CATARACT EXTRACTION W/PHACO Right 07/14/2017   Procedure: CATARACT EXTRACTION PHACO AND INTRAOCULAR LENS PLACEMENT (IOC);  Surgeon: Birder Robson, MD;  Location: [facility] ORS;  Service: Ophthalmology;  Laterality: Right;  [place] 00:31 AP% 15.7 CDE 4.97 Fluid pack lot # [id number]  . CATARACT EXTRACTION W/PHACO Left 08/04/2017   Procedure: CATARACT EXTRACTION PHACO AND INTRAOCULAR LENS PLACEMENT (IOC);  Surgeon: Birder Robson, MD;  Location: [facility] ORS;  Service: Ophthalmology;  Laterality:  Left;  [place] 00:34 AP% 18.7 CDE 6.42 Fluid pack lot # [id number]  . COLONOSCOPY  2002   Normal  . HAND SURGERY  2005   Left hand thrombosis  . NASAL HEMORRHAGE CONTROL  2007  . RIGHT HEART CATHETERIZATION N/A 03/16/2015   Procedure: RIGHT HEART CATH;  Surgeon: Larey Dresser, MD;  Location: [facility] CATH LAB;  Service: Cardiovascular;  Laterality: N/A;  . SHOULDER SURGERY  2008   Right shoulder bone spur  . THYROGLOSSAL DUCT CYST     Family History  Problem Relation Age of Onset  . Heart attack Mother 70  . Emphysema Mother   . Allergies Mother   . Asthma Mother   . Allergies Father   . Glaucoma Father   . Arrhythmia Sister        Atrial fibrillation  . Cancer Sister        breast  . Emphysema Sister   . Heart disease Sister   . Rheumatologic disease Sister   . Lung cancer Sister   . Diabetes Other        Parent  . Hyperlipidemia Other        Other relative  . Hypertension Other        Parent, other relative  . Breast cancer Other  Other relative, sister  . Allergies Other        Siblings  . Asthma Other        Sisters  . Colon cancer Neg Hx   . Esophageal cancer Neg Hx   . Rectal cancer Neg Hx   . Stomach cancer Neg Hx    History  Sexual Activity  . Sexual activity: Yes  . Partners: Male    Outpatient Encounter Prescriptions as of 08/19/2017  Medication Sig  . chlorpheniramine (CHLOR-TRIMETON) 4 MG tablet Take 8 mg by mouth 2 (two) times daily as needed for allergies.  . Cholecalciferol (VITAMIN D3) 10000 units capsule Take 10,000 Units by mouth daily.  Marland Kitchen estradiol (ESTRACE) 1 MG tablet Take 1 tablet (1 mg total) by mouth daily. (Patient taking differently: Take 0.5 mg by mouth daily. )  . isosorbide mononitrate (IMDUR) 60 MG 24 hr tablet TAKE 1 TABLET (60 MG TOTAL) BY MOUTH DAILY.  . metoprolol succinate (TOPROL-XL) 25 MG 24 hr tablet Take 0.5 tablets (12.5 mg total) by mouth daily.  . metroNIDAZOLE (METROCREAM) 0.75 % cream as needed.   . Moxifloxacin HCl  (VIGAMOX OP) Apply to eye.  . pantoprazole (PROTONIX) 40 MG tablet Take 1 tablet (40 mg total) by mouth daily.  . sertraline (ZOLOFT) 50 MG tablet TAKE 1 TABLET BY MOUTH EVERY DAY  . traZODone (DESYREL) 100 MG tablet TAKE 1 TABLET (100 MG TOTAL) BY MOUTH AT BEDTIME.  . Turmeric 500 MG CAPS Take 1 capsule by mouth daily.  . [DISCONTINUED] trimethoprim-polymyxin b (POLYTRIM) ophthalmic solution Place 1 drop into the left eye every 4 (four) hours.   No facility-administered encounter medications on file as of 08/19/2017.     Activities of Daily Living In your present state of health, do you have any difficulty performing the following activities: 08/19/2017  Hearing? N  Vision? N  Difficulty concentrating or making decisions? N  Walking or climbing stairs? N  Dressing or bathing? N  Doing errands, shopping? N  Preparing Food and eating ? N  Using the Toilet? N  In the past six months, have you accidently leaked urine? Y  Do you have problems with loss of bowel control? N  Managing your Medications? N  Managing your Finances? N  Housekeeping or managing your Housekeeping? N  Some recent data might be hidden    Patient Care Team: [name], Wynelle Fanny, MD as PCP - General Birder Robson, MD as Referring Physician (Ophthalmology) Larey Dresser, MD as Consulting Physician (Cardiology) Emily Filbert, MD as Consulting Physician (Obstetrics and Gynecology) Melida Quitter, MD as Consulting Physician (Otolaryngology)    Assessment:     Hearing Screening   125Hz  250Hz  500Hz  1000Hz  2000Hz  3000Hz  4000Hz  6000Hz  8000Hz   Right ear:   40 40 40  40    Left ear:   40 40 40  0    Vision Screening Comments: Last vision exam in Sept 2018 with Dr. George Ina   Exercise Activities and Dietary recommendations Current Exercise Habits: Home exercise routine, Type of exercise: Other - see comments (pickleball), Time (Minutes): > 60 (120 min), Frequency (Times/Week): 2, Weekly Exercise (Minutes/Week): 0,  Intensity: Moderate, Exercise limited by: None identified  Goals    . Increase physical activity          Starting 08/19/2017, I will continue to exercise for at least 120 min 2 days per week.       Fall Risk Fall Risk  08/19/2017 08/18/2016 03/24/2014  Falls in the past  year? No No No   Depression Screen PHQ 2/9 Scores 08/19/2017 08/18/2016 03/24/2014  PHQ - 2 Score 0 0 0  PHQ- 9 Score 0 - -     Cognitive Function MMSE - Mini Mental State Exam 08/19/2017 08/18/2016  Orientation to time 5 5  Orientation to Place 5 5  Registration 3 3  Attention/ Calculation 0 0  Recall 3 3  Language- name 2 objects 0 0  Language- repeat 1 1  Language- follow 3 step command 3 3  Language- read & follow direction 0 0  Write a sentence 0 0  Copy design 0 0  Total score 20 20     PLEASE NOTE: A Mini-Cog screen was completed. Maximum score is 20. A value of 0 denotes this part of Folstein MMSE was not completed or the patient failed this part of the Mini-Cog screening.   Mini-Cog Screening Orientation to Time - Max 5 pts Orientation to Place - Max 5 pts Registration - Max 3 pts Recall - Max 3 pts Language Repeat - Max 1 pts Language Follow 3 Step Command - Max 3 pts     Immunization History  Administered Date(s) Administered  . Influenza Split 09/26/2011, 10/15/2012  . Influenza Whole 09/04/2010  . Influenza,inj,Quad PF,6+ Mos 09/06/2013, 09/08/2014, 09/17/2015, 08/18/2016, 08/19/2017  . Pneumococcal Conjugate-13 08/25/2016  . Pneumococcal Polysaccharide-23 10/07/2008, 08/19/2017  . Td 08/08/2009  . Zoster 12/08/2008   Screening Tests Health Maintenance  Topic Date Due  . MAMMOGRAM  02/03/2019  . TETANUS/TDAP  08/09/2019  . COLONOSCOPY  10/15/2021  . INFLUENZA VACCINE  Completed  . DEXA SCAN  Completed  . Hepatitis C Screening  Completed  . PNA vac Low Risk Adult  Completed      Plan:     I have personally reviewed and addressed the Medicare Annual Wellness questionnaire and  have noted the following in the patient's chart:  A. Medical and social history B. Use of alcohol, tobacco or illicit drugs  C. Current medications and supplements D. Functional ability and status E.  Nutritional status F.  Physical activity G. Advance directives H. List of other physicians I.  Hospitalizations, surgeries, and ER visits in previous 12 months J.  [place] to include hearing, vision, cognitive, depression L. Referrals and appointments - none  In addition, I have reviewed and discussed with patient certain preventive protocols, quality metrics, and best practice recommendations. A written personalized care plan for preventive services as well as general preventive health recommendations were provided to patient.  See attached scanned questionnaire for additional information.   Signed,   Lindell Noe, MHA, BS, LPN Health Coach

## 2017-08-26 ENCOUNTER — Encounter: Payer: Self-pay | Admitting: Family Medicine

## 2017-08-26 ENCOUNTER — Ambulatory Visit (INDEPENDENT_AMBULATORY_CARE_PROVIDER_SITE_OTHER): Payer: PPO | Admitting: Family Medicine

## 2017-08-26 VITALS — BP 118/72 | HR 70 | Temp 98.4°F | Ht 63.75 in | Wt 163.2 lb

## 2017-08-26 DIAGNOSIS — I272 Pulmonary hypertension, unspecified: Secondary | ICD-10-CM

## 2017-08-26 DIAGNOSIS — E559 Vitamin D deficiency, unspecified: Secondary | ICD-10-CM | POA: Diagnosis not present

## 2017-08-26 DIAGNOSIS — R7303 Prediabetes: Secondary | ICD-10-CM | POA: Diagnosis not present

## 2017-08-26 DIAGNOSIS — Z0000 Encounter for general adult medical examination without abnormal findings: Secondary | ICD-10-CM | POA: Diagnosis not present

## 2017-08-26 DIAGNOSIS — N951 Menopausal and female climacteric states: Secondary | ICD-10-CM

## 2017-08-26 DIAGNOSIS — E7800 Pure hypercholesterolemia, unspecified: Secondary | ICD-10-CM | POA: Diagnosis not present

## 2017-08-26 DIAGNOSIS — I493 Ventricular premature depolarization: Secondary | ICD-10-CM | POA: Diagnosis not present

## 2017-08-26 DIAGNOSIS — M85851 Other specified disorders of bone density and structure, right thigh: Secondary | ICD-10-CM | POA: Diagnosis not present

## 2017-08-26 DIAGNOSIS — F4323 Adjustment disorder with mixed anxiety and depressed mood: Secondary | ICD-10-CM

## 2017-08-26 MED ORDER — TRAZODONE HCL 100 MG PO TABS: 100.0000 mg | ORAL_TABLET | Freq: Every day | ORAL | 3 refills | Status: DC

## 2017-08-26 MED ORDER — PANTOPRAZOLE SODIUM 40 MG PO TBEC
40.0000 mg | DELAYED_RELEASE_TABLET | Freq: Every day | ORAL | 3 refills | Status: DC
Start: 2017-08-26 — End: 2018-08-27

## 2017-08-26 MED ORDER — SERTRALINE HCL 50 MG PO TABS: 50.0000 mg | ORAL_TABLET | Freq: Every day | ORAL | 3 refills | Status: DC

## 2017-08-26 MED ORDER — METOPROLOL SUCCINATE ER 25 MG PO TB24: 12.5000 mg | ORAL_TABLET | Freq: Every day | ORAL | 3 refills | Status: DC

## 2017-08-26 MED ORDER — ISOSORBIDE MONONITRATE ER 60 MG PO TB24: ORAL_TABLET | ORAL | 3 refills | Status: DC

## 2017-08-26 NOTE — Assessment & Plan Note (Signed)
No symptoms  Pt is unsure if she has to f/u with cardiology routinely and will call the office

## 2017-08-26 NOTE — Assessment & Plan Note (Signed)
Vitamin D level is therapeutic with current supplementation Disc importance of this to bone and overall health Level 61.9

## 2017-08-26 NOTE — Patient Instructions (Addendum)
Don't forget to call your cardiology office to see if they want you to follow up  To loose weight  Aim for exercise 5 days per week 30 minutes or more  Try to get most of your carbohydrates from produce (with the exception of white potatoes)  Eat less bread/pasta/rice/snack foods/cereals/sweets and other items from the middle of the grocery store (processed carbs)  Weight watchers is a great program   Mammogram is due in February   For cholesterol  Avoid red meat/ fried foods/ egg yolks/ fatty breakfast meats/ butter, cheese and high fat dairy/ and shellfish

## 2017-08-26 NOTE — Assessment & Plan Note (Signed)
Lab Results  Component Value Date   HGBA1C 5.4 08/19/2017   disc imp of low glycemic diet and wt loss to prevent DM2  Handout given for prediabetes

## 2017-08-26 NOTE — Assessment & Plan Note (Signed)
Rev dexa 2017 Mild in RFN One fall  No fracture  Disc fall prev On ca and D Continue to exercise  Check every 2 y

## 2017-08-26 NOTE — Assessment & Plan Note (Signed)
Reviewed health habits including diet and exercise and skin cancer prevention Reviewed appropriate screening tests for age  Also reviewed health mt list, fam hx and immunization status , as well as social and family history   See HPI amw reviewed Labs reviewed She will schedule mammogram in feb  utd screening  Disc plan for better diet (fat/carb lower)

## 2017-08-26 NOTE — Progress Notes (Signed)
Subjective:    Patient ID: Mary Gordon, female    DOB: March 22, 1946, 71 y.o.   MRN: [id number]  HPI  Here for health maintenance exam and to review chronic medical problems    Doing ok  Taking care of herself   Had amw 9/12 Missed high done in L ear for hearing screen (does not bother her)  Given PPSV23 vaccine and flu vaccine    Wt Readings from Last 3 Encounters:  08/26/17 163 lb 4 oz (74 kg)  08/19/17 161 lb 12 oz (73.4 kg)  07/14/17 158 lb (71.7 kg)  still playing pickle ball - at least 2-3 times per week , no other exercise  Eats well sometimes / other times she is not concerned  28.24 kg/m  Mammogram 2/18 neg Self breast exam - no lumps  Sister had breast cancer   Still sees gyn Dr Hulan Fray   Colonoscopy 11/12 nl with 10 y recall   dexa 1/17- mild osteopenia of RFN Vit D level 61.9 Exercising  Fell on Monday -when she was playing ball / realized she lost the traction on her old shoes and slipped  No fractures   zostavax 1/10  BP Readings from Last 3 Encounters:  08/26/17 118/72  08/19/17 102/68  08/04/17 (!) 97/58   Needs refill of metoprolol for pvc Isosorbide for microvasc  She will check to see if she needs to f/u with cardiology   Hyperlipidemia Lab Results  Component Value Date   CHOL 256 (H) 08/19/2017   CHOL 208 (H) 08/19/2016   CHOL 225 (H) 09/17/2015   Lab Results  Component Value Date   HDL 87.60 08/19/2017   HDL 54.20 08/19/2016   HDL 60.50 09/17/2015   Lab Results  Component Value Date   LDLCALC 150 (H) 08/19/2017   LDLCALC 130 (H) 08/19/2016   LDLCALC 130 (H) 09/17/2015   Lab Results  Component Value Date   TRIG 89.0 08/19/2017   TRIG 119.0 08/19/2016   TRIG 175.0 (H) 09/17/2015   Lab Results  Component Value Date   CHOLHDL 3 08/19/2017   CHOLHDL 4 08/19/2016   CHOLHDL 4 09/17/2015   Lab Results  Component Value Date   LDLDIRECT 161.5 10/24/2013   LDLDIRECT 153.0 10/15/2012   Intolerant of statin in the  past/zocor  Does eat some sat fats but not often  Avoids fried foods  Does use some cream in her coffee  More full fat foods   Labs: Results for orders placed or performed in visit on 08/19/17  CBC with Differential/Platelet  Result Value Ref Range   WBC 5.1 4.0 - 10.5 K/uL   RBC 4.43 3.87 - 5.11 Mil/uL   Hemoglobin 12.4 12.0 - 15.0 g/dL   HCT 38.6 36.0 - 46.0 %   MCV 87.3 78.0 - 100.0 fl   MCHC 32.1 30.0 - 36.0 g/dL   RDW 13.7 11.5 - 15.5 %   Platelets 291.0 150.0 - 400.0 K/uL   Neutrophils Relative % 56.4 43.0 - 77.0 %   Lymphocytes Relative 33.9 12.0 - 46.0 %   Monocytes Relative 5.8 3.0 - 12.0 %   Eosinophils Relative 3.4 0.0 - 5.0 %   Basophils Relative 0.5 0.0 - 3.0 %   Neutro Abs 2.9 1.4 - 7.7 K/uL   Lymphs Abs 1.7 0.7 - 4.0 K/uL   Monocytes Absolute 0.3 0.1 - 1.0 K/uL   Eosinophils Absolute 0.2 0.0 - 0.7 K/uL   Basophils Absolute 0.0 0.0 - 0.1 K/uL  Comprehensive  metabolic panel  Result Value Ref Range   Sodium 139 135 - 145 mEq/L   Potassium 4.5 3.5 - 5.1 mEq/L   Chloride 103 96 - 112 mEq/L   CO2 28 19 - 32 mEq/L   Glucose, Bld 103 (H) 70 - 99 mg/dL   BUN 8 6 - 23 mg/dL   Creatinine, Ser 0.83 0.40 - 1.20 mg/dL   Total Bilirubin 0.8 0.2 - 1.2 mg/dL   Alkaline Phosphatase 81 39 - 117 U/L   AST 17 0 - 37 U/L   ALT 16 0 - 35 U/L   Total Protein 6.5 6.0 - 8.3 g/dL   Albumin 4.1 3.5 - 5.2 g/dL   Calcium 9.4 8.4 - 10.5 mg/dL   GFR 72.04 >60.00 mL/min  Lipid panel  Result Value Ref Range   Cholesterol 256 (H) 0 - 200 mg/dL   Triglycerides 89.0 0.0 - 149.0 mg/dL   HDL 87.60 >39.00 mg/dL   VLDL 17.8 0.0 - 40.0 mg/dL   LDL Cholesterol 150 (H) 0 - 99 mg/dL   Total CHOL/HDL Ratio 3    NonHDL 168.25   Hemoglobin A1c  Result Value Ref Range   Hgb A1c MFr Bld 5.4 4.6 - 6.5 %  TSH  Result Value Ref Range   TSH 1.37 0.35 - 4.50 uIU/mL  VITAMIN D 25 Hydroxy (Vit-D Deficiency, Fractures)  Result Value Ref Range   VITD 61.98 30.00 - 100.00 ng/mL     Hyperglycemia  Patient Active Problem List   Diagnosis Date Noted  . Neck pain on left side 09/01/2016  . Prediabetes 08/25/2016  . Routine general medical examination at a health care facility 08/19/2016  . Stress reaction 09/17/2015  . 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 Rosacea 12/27/2014  . Pulmonary HTN ([place]) 10/20/2014  . DOE (dyspnea on exertion) 09/26/2014  . PVCs (premature ventricular contractions) 09/26/2014  . Encounter for Medicare annual wellness exam 03/18/2014  . Fibromyalgia 05/31/2012  . Special screening for malignant neoplasms, colon 09/26/2011  . GERD (gastroesophageal reflux disease) 07/08/2011  . CERVICAL RADICULOPATHY, RIGHT 09/04/2010  . PULMONARY NODULE 09/14/2009  . PALPITATIONS 08/17/2009  . Vitamin D deficiency 02/22/2009  . HYPERCHOLESTEROLEMIA 02/22/2009  . Adjustment disorder with mixed anxiety and depressed mood 02/22/2009  . ALLERGIC RHINITIS 02/22/2009  . MENOPAUSAL SYNDROME 02/22/2009  . Osteopenia 02/22/2009   Past Medical History:  Diagnosis Date  . Allergy    all year  . Anxiety   . Bursitis of hip   . Cataract   . Cervical cancer ([place]) 1978  . Chronic kidney disease    RENAL INSUFF  . Depression   . Dysrhythmia    PVC'S  . Fibromyalgia   . GERD (gastroesophageal reflux disease)   . High cholesterol   . History of Clostridium difficile infection    In past from augmentin  . History of hiatal hernia   . Osteopenia   . Palpitations   . Panic disorder   . Pulmonary HTN ([place]) 10/20/2014   moderate with PASP 48mmHg  . Pulmonary nodule    Being observed  . Vitamin D deficiency    Past Surgical History:  Procedure Laterality Date  . ABDOMINAL HYSTERECTOMY  1978  . CARDIAC CATHETERIZATION    . CATARACT EXTRACTION W/PHACO Right 07/14/2017   Procedure: CATARACT EXTRACTION PHACO AND INTRAOCULAR LENS PLACEMENT (IOC);  Surgeon: Birder Robson, MD;  Location: [facility] ORS;  Service: Ophthalmology;  Laterality: Right;  [place] 00:31 AP%  15.7 CDE 4.97 Fluid pack lot #  [id number]  . CATARACT EXTRACTION W/PHACO Left 08/04/2017   Procedure: CATARACT EXTRACTION PHACO AND INTRAOCULAR LENS PLACEMENT (IOC);  Surgeon: Birder Robson, MD;  Location: [facility] ORS;  Service: Ophthalmology;  Laterality: Left;  [place] 00:34 AP% 18.7 CDE 6.42 Fluid pack lot # [id number]  . COLONOSCOPY  2002   Normal  . HAND SURGERY  2005   Left hand thrombosis  . NASAL HEMORRHAGE CONTROL  2007  . RIGHT HEART CATHETERIZATION N/A 03/16/2015   Procedure: RIGHT HEART CATH;  Surgeon: Larey Dresser, MD;  Location: [facility] CATH LAB;  Service: Cardiovascular;  Laterality: N/A;  . SHOULDER SURGERY  2008   Right shoulder bone spur  . THYROGLOSSAL DUCT CYST     Social History  Substance Use Topics  . Smoking status: Former Smoker    Packs/day: 1.00    Years: 17.00    Types: Cigarettes    Quit date: 12/08/1978  . Smokeless tobacco: Never Used  . Alcohol use No   Family History  Problem Relation Age of Onset  . Heart attack Mother 68  . Emphysema Mother   . Allergies Mother   . Asthma Mother   . Allergies Father   . Glaucoma Father   . Arrhythmia Sister        Atrial fibrillation  . Cancer Sister        breast  . Emphysema Sister   . Heart disease Sister   . Rheumatologic disease Sister   . Lung cancer Sister   . Diabetes Other        Parent  . Hyperlipidemia Other        Other relative  . Hypertension Other        Parent, other relative  . Breast cancer Other        Other relative, sister  . Allergies Other        Siblings  . Asthma Other        Sisters  . Colon cancer Neg Hx   . Esophageal cancer Neg Hx   . Rectal cancer Neg Hx   . Stomach cancer Neg Hx    Allergies  Allergen Reactions  . Adhesive [Tape] Rash    Burns skins   . Amoxicillin-Pot Clavulanate Other (See Comments)    Cdiff  . Simvastatin     myalgias   Current Outpatient Prescriptions on File Prior to Visit  Medication Sig Dispense Refill  . chlorpheniramine  (CHLOR-TRIMETON) 4 MG tablet Take 8 mg by mouth 2 (two) times daily as needed for allergies.    . Cholecalciferol (VITAMIN D3) 10000 units capsule Take 10,000 Units by mouth daily.    Marland Kitchen estradiol (ESTRACE) 1 MG tablet Take 1 tablet (1 mg total) by mouth daily. (Patient taking differently: Take 0.5 mg by mouth daily. ) 100 tablet 4  . metroNIDAZOLE (METROCREAM) 0.75 % cream as needed.     . Turmeric 500 MG CAPS Take 1 capsule by mouth daily.     No current facility-administered medications on file prior to visit.       Review of Systems  Constitutional: Negative for activity change, appetite change, fatigue, fever and unexpected weight change.  HENT: Negative for congestion, ear pain, rhinorrhea, sinus pressure and sore throat.   Eyes: Negative for pain, redness and visual disturbance.  Respiratory: Negative for cough, shortness of breath and wheezing.   Cardiovascular: Negative for chest pain and palpitations.  Gastrointestinal: Negative for abdominal pain, blood in stool, constipation and diarrhea.  Endocrine: Negative for polydipsia  and polyuria.  Genitourinary: Negative for dysuria, frequency and urgency.  Musculoskeletal: Negative for arthralgias, back pain and myalgias.  Skin: Negative for pallor and rash.  Allergic/Immunologic: Negative for environmental allergies.  Neurological: Negative for dizziness, syncope and headaches.  Hematological: Negative for adenopathy. Does not bruise/bleed easily.  Psychiatric/Behavioral: Negative for decreased concentration and dysphoric mood. The patient is not nervous/anxious.        Pos for stressors        Objective:   Physical Exam  Constitutional: She appears well-developed and well-nourished. No distress.  overwt and well app  HENT:  Head: Normocephalic and atraumatic.  Right Ear: External ear normal.  Left Ear: External ear normal.  Mouth/Throat: Oropharynx is clear and moist.  Eyes: Pupils are equal, round, and reactive to light.  Conjunctivae and EOM are normal. No scleral icterus.  Neck: Normal range of motion. Neck supple. No JVD present. Carotid bruit is not present. No thyromegaly present.  Cardiovascular: Normal rate, regular rhythm, normal heart sounds and intact distal pulses.  Exam reveals no gallop.   Pulmonary/Chest: Effort normal and breath sounds normal. No respiratory distress. She has no wheezes. She exhibits no tenderness.  Abdominal: Soft. Bowel sounds are normal. She exhibits no distension, no abdominal bruit and no mass. There is no tenderness.  Genitourinary: No breast swelling, tenderness, discharge or bleeding.  Genitourinary Comments: Breast exam: No mass, nodules, thickening, tenderness, bulging, retraction, inflamation, nipple discharge or skin changes noted.  No axillary or clavicular LA.      Musculoskeletal: Normal range of motion. She exhibits no edema or tenderness.  No kyphosis   Lymphadenopathy:    She has no cervical adenopathy.  Neurological: She is alert. She has normal reflexes. No cranial nerve deficit. She exhibits normal muscle tone. Coordination normal.  Skin: Skin is warm and dry. No rash noted. No erythema. No pallor.  Solar lentigines diffusely   Psychiatric: She has a normal mood and affect.          Assessment & Plan:   Problem List Items Addressed This Visit      Cardiovascular and Mediastinum   Pulmonary HTN ([place])    No symptoms  Pt is unsure if she has to f/u with cardiology routinely and will call the office      Relevant Medications   metoprolol succinate (TOPROL-XL) 25 MG 24 hr tablet   isosorbide mononitrate (IMDUR) 60 MG 24 hr tablet   PVCs (premature ventricular contractions)    Metoprolol works well Refilled today  Good bp      Relevant Medications   metoprolol succinate (TOPROL-XL) 25 MG 24 hr tablet   isosorbide mononitrate (IMDUR) 60 MG 24 hr tablet     Musculoskeletal and Integument   Osteopenia - Primary    Rev dexa 2017 Mild in RFN One  fall  No fracture  Disc fall prev On ca and D Continue to exercise  Check every 2 y         Other   Adjustment disorder with mixed anxiety and depressed mood    Continues sertraline Reviewed stressors/ coping techniques/symptoms/ support sources/ tx options and side effects in detail today Husband is not an active participant in her life and she tries to socialize outside of home  trazadone helps sleep      HYPERCHOLESTEROLEMIA    Disc goals for lipids and reasons to control them Rev labs with pt Rev low sat fat diet in detail Intol of statins  HDL and LDL went up -  ratio improved Disc diet  She may try red yeast rice otc      Relevant Medications   metoprolol succinate (TOPROL-XL) 25 MG 24 hr tablet   isosorbide mononitrate (IMDUR) 60 MG 24 hr tablet   MENOPAUSAL SYNDROME    Pt continues HRT from her gyn       Prediabetes    Lab Results  Component Value Date   HGBA1C 5.4 08/19/2017   disc imp of low glycemic diet and wt loss to prevent DM2  Handout given for prediabetes       Routine general medical examination at a health care facility    Reviewed health habits including diet and exercise and skin cancer prevention Reviewed appropriate screening tests for age  Also reviewed health mt list, fam hx and immunization status , as well as social and family history   See HPI amw reviewed Labs reviewed She will schedule mammogram in feb  utd screening  Disc plan for better diet (fat/carb lower)      Vitamin D deficiency    Vitamin D level is therapeutic with current supplementation Disc importance of this to bone and overall health Level 61.9

## 2017-08-26 NOTE — Assessment & Plan Note (Signed)
Pt continues HRT from her gyn

## 2017-08-26 NOTE — Assessment & Plan Note (Signed)
Disc goals for lipids and reasons to control them Rev labs with pt Rev low sat fat diet in detail Intol of statins  HDL and LDL went up - ratio improved Disc diet  She may try red yeast rice otc

## 2017-08-26 NOTE — Assessment & Plan Note (Signed)
Metoprolol works well Refilled today  Good bp

## 2017-08-26 NOTE — Assessment & Plan Note (Signed)
Continues sertraline Reviewed stressors/ coping techniques/symptoms/ support sources/ tx options and side effects in detail today Husband is not an active participant in her life and she tries to socialize outside of home  trazadone helps sleep

## 2017-10-16 ENCOUNTER — Other Ambulatory Visit: Payer: Self-pay | Admitting: Obstetrics & Gynecology

## 2017-10-16 DIAGNOSIS — Z79890 Hormone replacement therapy (postmenopausal): Principal | ICD-10-CM

## 2017-10-16 DIAGNOSIS — E8940 Asymptomatic postprocedural ovarian failure: Secondary | ICD-10-CM

## 2018-01-26 ENCOUNTER — Other Ambulatory Visit: Payer: Self-pay | Admitting: Family Medicine

## 2018-01-26 DIAGNOSIS — Z1231 Encounter for screening mammogram for malignant neoplasm of breast: Secondary | ICD-10-CM

## 2018-02-01 ENCOUNTER — Ambulatory Visit (INDEPENDENT_AMBULATORY_CARE_PROVIDER_SITE_OTHER): Payer: PPO | Admitting: Family Medicine

## 2018-02-01 ENCOUNTER — Encounter: Payer: Self-pay | Admitting: Family Medicine

## 2018-02-01 VITALS — BP 112/64 | HR 81 | Temp 98.1°F | Ht 63.5 in | Wt 165.8 lb

## 2018-02-01 DIAGNOSIS — M797 Fibromyalgia: Secondary | ICD-10-CM | POA: Diagnosis not present

## 2018-02-01 DIAGNOSIS — L719 Rosacea, unspecified: Secondary | ICD-10-CM | POA: Diagnosis not present

## 2018-02-01 DIAGNOSIS — I272 Pulmonary hypertension, unspecified: Secondary | ICD-10-CM

## 2018-02-01 MED ORDER — DOXYCYCLINE HYCLATE 20 MG PO TABS: 20.0000 mg | ORAL_TABLET | Freq: Two times a day (BID) | ORAL | 2 refills | Status: DC

## 2018-02-01 MED ORDER — METRONIDAZOLE 0.75 % EX CREA: TOPICAL_CREAM | Freq: Two times a day (BID) | CUTANEOUS | 3 refills | Status: DC | PRN

## 2018-02-01 NOTE — Progress Notes (Signed)
Subjective:    Patient ID: Mary Gordon, female    DOB: Aug 10, 1946, 72 y.o.   MRN: [id number]  HPI Here for visit for rosacea   Have px metro cream in the past for facial erythema/papules  Not "doing much of anything" for it   Gets tiny "blisters"- and they take forever to heal up  Under her skin she can feel bumps  Once in a blue moon = pustules  Also redness  Thinks it is leaving scars  Washes face with derm-E (health food store)- unsure what is in it / extra gentle  She uses clinique moisturizing lotion  Sunscreen- does not use (at all)  No abrasive products (very soft wash cloth)  Complexion is more dry as she gets older   Is outdoors some times  She does not tend to burn   Most of her redness is over her cheeks  Breaks out the most - chin/around cheeks and upper lip   Started a new product to cleanse uric acid in the blood  Life Tones  Has mainly herbs /and celery extract   Patient Active Problem List   Diagnosis Date Noted  . Neck pain on left side 09/01/2016  . Prediabetes 08/25/2016  . Routine general medical examination at a health care facility 08/19/2016  . Stress reaction 09/17/2015  . 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 Rosacea 12/27/2014  . Pulmonary HTN ([place]) 10/20/2014  . DOE (dyspnea on exertion) 09/26/2014  . PVCs (premature ventricular contractions) 09/26/2014  . Encounter for Medicare annual wellness exam 03/18/2014  . Fibromyalgia 05/31/2012  . Special screening for malignant neoplasms, colon 09/26/2011  . GERD (gastroesophageal reflux disease) 07/08/2011  . CERVICAL RADICULOPATHY, RIGHT 09/04/2010  . PULMONARY NODULE 09/14/2009  . PALPITATIONS 08/17/2009  . Vitamin D deficiency 02/22/2009  . HYPERCHOLESTEROLEMIA 02/22/2009  . Adjustment disorder with mixed anxiety and depressed mood 02/22/2009  . ALLERGIC RHINITIS 02/22/2009  . MENOPAUSAL SYNDROME 02/22/2009  . Osteopenia 02/22/2009   Past Medical History:  Diagnosis Date  .  Allergy    all year  . Anxiety   . Bursitis of hip   . Cataract   . Cervical cancer ([place]) 1978  . Chronic kidney disease    RENAL INSUFF  . Depression   . Dysrhythmia    PVC'S  . Fibromyalgia   . GERD (gastroesophageal reflux disease)   . High cholesterol   . History of Clostridium difficile infection    In past from augmentin  . History of hiatal hernia   . Osteopenia   . Palpitations   . Panic disorder   . Pulmonary HTN ([place]) 10/20/2014   moderate with PASP 15mmHg  . Pulmonary nodule    Being observed  . Vitamin D deficiency    Past Surgical History:  Procedure Laterality Date  . ABDOMINAL HYSTERECTOMY  1978  . CARDIAC CATHETERIZATION    . CATARACT EXTRACTION W/PHACO Right 07/14/2017   Procedure: CATARACT EXTRACTION PHACO AND INTRAOCULAR LENS PLACEMENT (IOC);  Surgeon: Birder Robson, MD;  Location: [facility] ORS;  Service: Ophthalmology;  Laterality: Right;  [place] 00:31 AP% 15.7 CDE 4.97 Fluid pack lot # [id number]  . CATARACT EXTRACTION W/PHACO Left 08/04/2017   Procedure: CATARACT EXTRACTION PHACO AND INTRAOCULAR LENS PLACEMENT (IOC);  Surgeon: Birder Robson, MD;  Location: [facility] ORS;  Service: Ophthalmology;  Laterality: Left;  [place] 00:34 AP% 18.7 CDE 6.42 Fluid pack lot # [id number]  . COLONOSCOPY  2002   Normal  . HAND SURGERY  2005  Left hand thrombosis  . NASAL HEMORRHAGE CONTROL  2007  . RIGHT HEART CATHETERIZATION N/A 03/16/2015   Procedure: RIGHT HEART CATH;  Surgeon: Larey Dresser, MD;  Location: [facility] CATH LAB;  Service: Cardiovascular;  Laterality: N/A;  . SHOULDER SURGERY  2008   Right shoulder bone spur  . THYROGLOSSAL DUCT CYST     Social History   Tobacco Use  . Smoking status: Former Smoker    Packs/day: 1.00    Years: 17.00    Pack years: 17.00    Types: Cigarettes    Last attempt to quit: 12/08/1978    Years since quitting: 39.1  . Smokeless tobacco: Never Used  Substance Use Topics  . Alcohol use: No    Alcohol/week: 0.0 oz  . Drug use: No    Family History  Problem Relation Age of Onset  . Heart attack Mother 107  . Emphysema Mother   . Allergies Mother   . Asthma Mother   . Allergies Father   . Glaucoma Father   . Arrhythmia Sister        Atrial fibrillation  . Cancer Sister        breast  . Emphysema Sister   . Heart disease Sister   . Rheumatologic disease Sister   . Lung cancer Sister   . Diabetes Other        Parent  . Hyperlipidemia Other        Other relative  . Hypertension Other        Parent, other relative  . Breast cancer Other        Other relative, sister  . Allergies Other        Siblings  . Asthma Other        Sisters  . Colon cancer Neg Hx   . Esophageal cancer Neg Hx   . Rectal cancer Neg Hx   . Stomach cancer Neg Hx    Allergies  Allergen Reactions  . Adhesive [Tape] Rash    Burns skins   . Amoxicillin-Pot Clavulanate Other (See Comments)    Cdiff  . Simvastatin     myalgias   Current Outpatient Medications on File Prior to Visit  Medication Sig Dispense Refill  . chlorpheniramine (CHLOR-TRIMETON) 4 MG tablet Take 8 mg by mouth 2 (two) times daily as needed for allergies.    . Cholecalciferol (VITAMIN D3) 10000 units capsule Take 10,000 Units by mouth daily.    Marland Kitchen estradiol (ESTRACE) 1 MG tablet TAKE 1 TABLET EVERY DAY 100 tablet 2  . isosorbide mononitrate (IMDUR) 60 MG 24 hr tablet TAKE 1 TABLET (60 MG TOTAL) BY MOUTH DAILY. 90 tablet 3  . metoprolol succinate (TOPROL-XL) 25 MG 24 hr tablet Take 0.5 tablets (12.5 mg total) by mouth daily. 45 tablet 3  . OVER THE COUNTER MEDICATION Life Tones (uric acid cleanser)- has celery extract and other herbs    . pantoprazole (PROTONIX) 40 MG tablet Take 1 tablet (40 mg total) by mouth daily. 90 tablet 3  . sertraline (ZOLOFT) 50 MG tablet Take 1 tablet (50 mg total) by mouth daily. 90 tablet 3  . traZODone (DESYREL) 100 MG tablet Take 1 tablet (100 mg total) by mouth at bedtime. 90 tablet 3   No current facility-administered medications  on file prior to visit.      Review of Systems  Constitutional: Negative for activity change, appetite change, fatigue, fever and unexpected weight change.  HENT: Negative for congestion, ear pain, rhinorrhea, sinus pressure  and sore throat.   Eyes: Negative for pain, redness and visual disturbance.  Respiratory: Negative for cough, chest tightness, shortness of breath and wheezing.   Cardiovascular: Negative for chest pain and palpitations.  Gastrointestinal: Negative for abdominal pain, blood in stool, constipation and diarrhea.  Endocrine: Negative for polydipsia and polyuria.  Genitourinary: Negative for dysuria, frequency and urgency.  Musculoskeletal: Negative for arthralgias, back pain and myalgias.  Skin: Positive for color change and rash. Negative for pallor.       Rosacea on face   Allergic/Immunologic: Negative for environmental allergies.  Neurological: Negative for dizziness, syncope and headaches.  Hematological: Negative for adenopathy. Does not bruise/bleed easily.  Psychiatric/Behavioral: Negative for decreased concentration and dysphoric mood. The patient is not nervous/anxious.        Objective:   Physical Exam  Constitutional: She appears well-developed and well-nourished. No distress.  Well appearing   HENT:  Head: Normocephalic and atraumatic.  Mouth/Throat: Oropharynx is clear and moist.  Eyes: Conjunctivae and EOM are normal. Pupils are equal, round, and reactive to light. No scleral icterus.  Neck: Normal range of motion. Neck supple.  Cardiovascular: Normal rate, regular rhythm and normal heart sounds.  Musculoskeletal: She exhibits no edema.  Lymphadenopathy:    She has no cervical adenopathy.  Neurological: She is alert. No cranial nerve deficit.  Skin: Skin is warm and dry. No pallor.  Facial rosacea - with mild erythema of cheek area  Papules/tiny (no pustules) on chin/cheeks and few above upper lip  telangectasias -diffusely in cheek area (small  and mild)  No open areas or excoriations  Solar lentigines diffusely Solar aging   Psychiatric: She has a normal mood and affect.          Assessment & Plan:   Problem List Items Addressed This Visit      Cardiovascular and Mediastinum   Pulmonary HTN ([place])    Continues to be asymptomatic         Musculoskeletal and Integument   Rosacea - Primary    No further improvement with metro cream  C/o more papules Disc lifestyle change to prevent flushing  Also imp of sunscreen (she does not use)- disc gentle products with spf 30 or more  Continue gentle cleansing Trial of low dose doxycycline 20 mg bid for 12 weeks If not helpful-consider ref to derm  Will update [place]  Also if any side effects         Other   Fibromyalgia    Pt has found much relief from otc herbal remedy called Life Tones tonic It has celery ext and other herbs to help "cleanse uric acid" She notes significant imp in her pain  Not FDA tested Will have to watch her labs for this

## 2018-02-01 NOTE — Patient Instructions (Addendum)
I like neutrogena sunscreens / some clinique brands also have sunscreen (protect your face)   Let's try doxycycline for 12 weeks (twice daily)   If the metro cream helps even a bit-keep using it   Continue mild cleansers   If no improvement - call and we will work on a dermatology referral

## 2018-02-01 NOTE — Assessment & Plan Note (Signed)
Pt has found much relief from otc herbal remedy called Life Tones tonic It has celery ext and other herbs to help "cleanse uric acid" She notes significant imp in her pain  Not FDA tested Will have to watch her labs for this

## 2018-02-01 NOTE — Assessment & Plan Note (Signed)
No further improvement with metro cream  C/o more papules Disc lifestyle change to prevent flushing  Also imp of sunscreen (she does not use)- disc gentle products with spf 30 or more  Continue gentle cleansing Trial of low dose doxycycline 20 mg bid for 12 weeks If not helpful-consider ref to derm  Will update [place]  Also if any side effects

## 2018-02-01 NOTE — Assessment & Plan Note (Signed)
Continues to be asymptomatic

## 2018-02-12 ENCOUNTER — Ambulatory Visit
Admission: RE | Admit: 2018-02-12 | Discharge: 2018-02-12 | Disposition: A | Discharge status: Home / Self Care | Payer: PPO | Source: Ambulatory Visit | Attending: Family Medicine | Admitting: Family Medicine

## 2018-02-12 DIAGNOSIS — Z1231 Encounter for screening mammogram for malignant neoplasm of breast: Secondary | ICD-10-CM

## 2018-03-02 ENCOUNTER — Telehealth: Payer: Self-pay | Admitting: Family Medicine

## 2018-03-02 NOTE — Telephone Encounter (Unsigned)
Copied from [place] [id number]. Topic: Quick Communication - See Telephone Encounter >> Mar 02, 2018  3:25 PM Neva Seat wrote: Pt has had the flu for 6 days and not getting better.  She wanted to come in to see Dr. Glori Bickers on Wed, who isn't available. The other PCP's were checked with nothing available.  There are some Same Day appts available. Please call pt to be worked in for an appointment at any time of the day on Wed.

## 2018-03-02 NOTE — Telephone Encounter (Signed)
I spoke with pt; flu like symptoms for 6 days; now prod cough with yellow phlegm and decreased appetite. No wheezing, SOB or fever. Pt wants appt for 03/03/18. 30' appt scheduled for 03/03/18 at 2 pm. If pt condition worsens prior to appt pt will go to UC if needed.FYI to Glenda Chroman FNP.

## 2018-03-03 ENCOUNTER — Encounter: Payer: Self-pay | Admitting: Family Medicine

## 2018-03-03 ENCOUNTER — Ambulatory Visit (INDEPENDENT_AMBULATORY_CARE_PROVIDER_SITE_OTHER): Payer: PPO | Admitting: Family Medicine

## 2018-03-03 VITALS — BP 118/62 | HR 76 | Temp 98.3°F | Wt 156.0 lb

## 2018-03-03 DIAGNOSIS — B9789 Other viral agents as the cause of diseases classified elsewhere: Secondary | ICD-10-CM | POA: Diagnosis not present

## 2018-03-03 DIAGNOSIS — J069 Acute upper respiratory infection, unspecified: Secondary | ICD-10-CM | POA: Diagnosis not present

## 2018-03-03 NOTE — Progress Notes (Signed)
Subjective:    Patient ID: Mary Gordon, female    DOB: 06/19/1946, 72 y.o.   MRN: [id number]  HPI This is a 72 yo female who presents today with cough and chest congestion. Started last week with cough, sore throat. Subjective fever x 3 days. Severe headache several days ago, better now. Vomiting 3 days ago x 1 day, some diarrhea, now resolved. Having nausea with eating. Eating small amounts, rice, oatmeal, chicken broth with noodles. Drinking water without nausea. No SOB, no nausea, no ear pain, little nasal drainage.  Feels better today, less cough, more energy.   Past Medical History:  Diagnosis Date  . Allergy    all year  . Anxiety   . Bursitis of hip   . Cataract   . Cervical cancer ([place]) 1978  . Chronic kidney disease    RENAL INSUFF  . Depression   . Dysrhythmia    PVC'S  . Fibromyalgia   . GERD (gastroesophageal reflux disease)   . High cholesterol   . History of Clostridium difficile infection    In past from augmentin  . History of hiatal hernia   . Osteopenia   . Palpitations   . Panic disorder   . Pulmonary HTN ([place]) 10/20/2014   moderate with PASP 52mmHg  . Pulmonary nodule    Being observed  . Vitamin D deficiency    Past Surgical History:  Procedure Laterality Date  . ABDOMINAL HYSTERECTOMY  1978  . CARDIAC CATHETERIZATION    . CATARACT EXTRACTION W/PHACO Right 07/14/2017   Procedure: CATARACT EXTRACTION PHACO AND INTRAOCULAR LENS PLACEMENT (IOC);  Surgeon: Birder Robson, MD;  Location: [facility] ORS;  Service: Ophthalmology;  Laterality: Right;  [place] 00:31 AP% 15.7 CDE 4.97 Fluid pack lot # [id number]  . CATARACT EXTRACTION W/PHACO Left 08/04/2017   Procedure: CATARACT EXTRACTION PHACO AND INTRAOCULAR LENS PLACEMENT (IOC);  Surgeon: Birder Robson, MD;  Location: [facility] ORS;  Service: Ophthalmology;  Laterality: Left;  [place] 00:34 AP% 18.7 CDE 6.42 Fluid pack lot # [id number]  . COLONOSCOPY  2002   Normal  . HAND SURGERY  2005   Left hand thrombosis    . NASAL HEMORRHAGE CONTROL  2007  . RIGHT HEART CATHETERIZATION N/A 03/16/2015   Procedure: RIGHT HEART CATH;  Surgeon: Larey Dresser, MD;  Location: [facility] CATH LAB;  Service: Cardiovascular;  Laterality: N/A;  . SHOULDER SURGERY  2008   Right shoulder bone spur  . THYROGLOSSAL DUCT CYST     Family History  Problem Relation Age of Onset  . Heart attack Mother 65  . Emphysema Mother   . Allergies Mother   . Asthma Mother   . Allergies Father   . Glaucoma Father   . Arrhythmia Sister        Atrial fibrillation  . Cancer Sister        breast  . Breast cancer Sister   . Emphysema Sister   . Heart disease Sister   . Rheumatologic disease Sister   . Lung cancer Sister   . Diabetes Other        Parent  . Hyperlipidemia Other        Other relative  . Hypertension Other        Parent, other relative  . Breast cancer Other        Other relative, sister  . Allergies Other        Siblings  . Asthma Other        Sisters  .  Colon cancer Neg Hx   . Esophageal cancer Neg Hx   . Rectal cancer Neg Hx   . Stomach cancer Neg Hx    Social History   Tobacco Use  . Smoking status: Former Smoker    Packs/day: 1.00    Years: 17.00    Pack years: 17.00    Types: Cigarettes    Last attempt to quit: 12/08/1978    Years since quitting: 39.2  . Smokeless tobacco: Never Used  Substance Use Topics  . Alcohol use: No    Alcohol/week: 0.0 oz  . Drug use: No        Review of Systems Per HPI    Objective:   Physical Exam  Constitutional: She is oriented to person, place, and time. She appears well-developed and well-nourished. No distress.  HENT:  Head: Normocephalic and atraumatic.  Right Ear: External ear normal.  Left Ear: External ear normal.  Nose: Nose normal.  Mouth/Throat: Mucous membranes are not pale and not dry. Posterior oropharyngeal erythema present. No oropharyngeal exudate.  Eyes: Conjunctivae are normal.  Neck: Normal range of motion. Neck supple.   Cardiovascular: Normal rate, regular rhythm and normal heart sounds.  Pulmonary/Chest: Effort normal and breath sounds normal.  Neurological: She is alert and oriented to person, place, and time.  Skin: Skin is warm and dry. She is not diaphoretic.  Slightly decreased hand skin turgor.   Psychiatric: She has a normal mood and affect. Her behavior is normal. Judgment and thought content normal.  Vitals reviewed.     BP 118/62   Pulse 76   Temp 98.3 F (36.8 C) (Oral)   Wt 156 lb (70.8 kg)   SpO2 94%   BMI 27.20 kg/m  Wt Readings from Last 3 Encounters:  03/03/18 156 lb (70.8 kg)  02/01/18 165 lb 12 oz (75.2 kg)  08/26/17 163 lb 4 oz (74 kg)       Assessment & Plan:  1. Viral URI with cough - Provided written and verbal information regarding diagnosis and treatment. - feeling better, concerning weight loss, but discussed fluid replacement and she will monitor carefully and RTC if not improved in 3-4 days or sooner if worsening -  Patient Instructions  Good to see you today, I hope you are feeling better soon  Try to drink 1-2 ounces every 30 minutes- Gatorade, water  For stomach irritation- over the counter Mylanta, Gaviscon  For cough- Delsym  To help loosen phlegm- plain Mucinex  Can take Tylenol for aches/fever  If not better in 3-4 days please let us know       Clarene Reamer, FNP-BC  [facility], [place]  03/03/2018 2:34 PM

## 2018-03-03 NOTE — Patient Instructions (Signed)
Good to see you today, I hope you are feeling better soon  Try to drink 1-2 ounces every 30 minutes- Gatorade, water  For stomach irritation- over the counter Mylanta, Gaviscon  For cough- Delsym  To help loosen phlegm- plain Mucinex  Can take Tylenol for aches/fever  If not better in 3-4 days please let us know

## 2018-03-11 DIAGNOSIS — H43813 Vitreous degeneration, bilateral: Secondary | ICD-10-CM | POA: Diagnosis not present

## 2018-03-16 ENCOUNTER — Ambulatory Visit (HOSPITAL_COMMUNITY)
Admission: RE | Admit: 2018-03-16 | Discharge: 2018-03-16 | Disposition: A | Discharge status: Home / Self Care | Payer: PPO | Source: Ambulatory Visit | Attending: Cardiology | Admitting: Cardiology

## 2018-03-16 ENCOUNTER — Encounter (HOSPITAL_COMMUNITY): Payer: Self-pay | Admitting: Cardiology

## 2018-03-16 ENCOUNTER — Other Ambulatory Visit: Payer: Self-pay

## 2018-03-16 VITALS — BP 109/47 | HR 75 | Wt 159.4 lb

## 2018-03-16 DIAGNOSIS — L719 Rosacea, unspecified: Secondary | ICD-10-CM | POA: Insufficient documentation

## 2018-03-16 DIAGNOSIS — R002 Palpitations: Secondary | ICD-10-CM | POA: Diagnosis not present

## 2018-03-16 DIAGNOSIS — R079 Chest pain, unspecified: Secondary | ICD-10-CM | POA: Diagnosis not present

## 2018-03-16 DIAGNOSIS — Z87891 Personal history of nicotine dependence: Secondary | ICD-10-CM | POA: Insufficient documentation

## 2018-03-16 DIAGNOSIS — E559 Vitamin D deficiency, unspecified: Secondary | ICD-10-CM | POA: Insufficient documentation

## 2018-03-16 DIAGNOSIS — F410 Panic disorder [episodic paroxysmal anxiety] without agoraphobia: Secondary | ICD-10-CM | POA: Insufficient documentation

## 2018-03-16 DIAGNOSIS — M8580 Other specified disorders of bone density and structure, unspecified site: Secondary | ICD-10-CM | POA: Insufficient documentation

## 2018-03-16 DIAGNOSIS — M797 Fibromyalgia: Secondary | ICD-10-CM | POA: Diagnosis not present

## 2018-03-16 DIAGNOSIS — Z8541 Personal history of malignant neoplasm of cervix uteri: Secondary | ICD-10-CM | POA: Diagnosis not present

## 2018-03-16 DIAGNOSIS — F329 Major depressive disorder, single episode, unspecified: Secondary | ICD-10-CM | POA: Diagnosis not present

## 2018-03-16 DIAGNOSIS — Z880 Allergy status to penicillin: Secondary | ICD-10-CM | POA: Insufficient documentation

## 2018-03-16 DIAGNOSIS — I272 Pulmonary hypertension, unspecified: Secondary | ICD-10-CM | POA: Insufficient documentation

## 2018-03-16 DIAGNOSIS — I493 Ventricular premature depolarization: Secondary | ICD-10-CM | POA: Diagnosis not present

## 2018-03-16 DIAGNOSIS — E785 Hyperlipidemia, unspecified: Secondary | ICD-10-CM | POA: Insufficient documentation

## 2018-03-16 DIAGNOSIS — Z79899 Other long term (current) drug therapy: Secondary | ICD-10-CM | POA: Insufficient documentation

## 2018-03-16 DIAGNOSIS — K219 Gastro-esophageal reflux disease without esophagitis: Secondary | ICD-10-CM | POA: Insufficient documentation

## 2018-03-16 DIAGNOSIS — E782 Mixed hyperlipidemia: Secondary | ICD-10-CM

## 2018-03-16 MED ORDER — METOPROLOL SUCCINATE ER 25 MG PO TB24: 25.0000 mg | ORAL_TABLET | Freq: Every day | ORAL | 3 refills | Status: DC

## 2018-03-16 NOTE — Patient Instructions (Signed)
Increase Toprol XL 25 mg (1 tab) daily at supper  Your physician has recommended that you wear a holter monitor. Holter monitors are medical devices that record the heart's electrical activity. Doctors most often use these monitors to diagnose arrhythmias. Arrhythmias are problems with the speed or rhythm of the heartbeat. The monitor is a small, portable device. You can wear one while you do your normal daily activities. This is usually used to diagnose what is causing palpitations/syncope (passing out).   Your physician recommends that you schedule a follow-up appointment in: 1 year with Dr. Aundra Dubin

## 2018-03-16 NOTE — Progress Notes (Signed)
Patient ID: Mary Gordon, female   DOB: 1946/05/24, 72 y.o.   MRN: [id number] PCP: Dr. Glori Bickers Cardiology: Dr. Aundra Dubin  72 y.o. with history of hyperlipidemia, chest pain, and palpitations presented initially for evaluation of pulmonary hypertension.  She was seen by Dr. Radford Pax for episodes central, substernal chest pressure that happens almost daily for > 6 months.  It lasts seconds to minutes and comes with exertion such as walking up steps or walking fast. Occasional has chest pain at rest.    Initially, she had ETT-Cardiolite done in 11/15 for the chest pressure.  This showed no ischemia or infarction.  Echo done in 11/15 also showed normal EF but PA systolic pressure was elevated at 49 mmHg.  The RV appeared normal.  She then had PFTs done in 12/15 that were normal.  She saw Dr. Gwenette Greet and is not thought to have COPD or asthma.  She had a CTA chest in 11/15 showing no acute findings, no PE.  Sleep study showed no OSA.  Given the palpitations, she had holter in 10/15, showing frequent PVCs (12.5% total beats).  She was started on Toprol XL with almost complete resolution of palpitations.  She has chronic muscle pain with focal points that is thought to be due to fibromyalgia.  I did a right heart cath in 4/16 showing no pulmonary hypertension and low filling pressures.    She developed episodes of chest tightness, and coronary CTA was done in 10/16, showing no CAD and calcium score 0.    She presents today for follow up of PVCs.  She had been doing well until a couple of months ago when PVCs started increasing in frequency.  She is now feeling lots of palpitations again, mainly at night when in bed or when she is still.  No chest pain. No exertional dyspnea. No lightheadedness or syncope. She plays pickle ball regularly and is generally active with no problems.  She does not note palpitations with exercise.    Labs (10/15): LDL 147, HDL 67 Labs (3/16): BNP 25, RF normal Labs (4/16): K 4.4,  creatinine 0.9 Labs (6/16) K 4.6, Creatinine 0.89 Labs (9/18): LDL 150, HDL 88, K 4.5, creatinine 0.83, hgb 12.4, TSH normal  Allergies:  1)  ! Augmentin  Past Medical History: 1. Palpitations: 48 hour holter in 7/12 with occasional PACs. Holter (10/15) with frequent PVCs, 12.5% of total.   2. Hyperlipidemia: Myalgias with simvastatin and another statin that she does not remember 3. Panic Disorder 4. Depression 5. Cervical cancer 6. osteopenia  7. vit D def  8. C difficile in past from Augmentin 9. Chest pain: ETT-myoview (7/12) with EF 79%, no ischemia or infarction. ETT-Cardiolite (11/15) with 6 min exercise, not gated, no ischemia or infarction.  - Coronary CTA (10/16) with no CAD, calcium score 0.  10. Pulmonary hypertension: Echo (11/15) with EF 60-65%, mild MR, normal RV size and systolic function, PA systolic pressure 49 mmHg.  CTA chest (11/15) with no evidence for PE.  Sleep study (12/15) with no OSA.  PFTs (12/15) with normal spirometry.  RHC (4/16) with mean RA 2, PA 21/3 mean 11, PCWP mean 2, CI 2.81 Fick 2.7 thermo. Possible false negative PA pressure elevation by echo.  11. Rosacea 12. Fibromyalgia 13. GERD 14. Thyroglossal duct cyst  Family History: Mother with MI at 74, sister with atrial fibrillation (had ablation)  Social History: Retired- from [employer]  married  Former Smoker- quit in her 53s Alcohol use-no Drug use-no Regular exercise-no  G4P1 Jehov[employer] Witness  Review of Systems        All systems reviewed and negative except as per HPI.   Current Outpatient Medications  Medication Sig Dispense Refill  . chlorpheniramine (CHLOR-TRIMETON) 4 MG tablet Take 8 mg by mouth 2 (two) times daily as needed for allergies.    . Cholecalciferol (VITAMIN D3) 10000 units capsule Take 10,000 Units by mouth daily.    Marland Kitchen doxycycline (PERIOSTAT) 20 MG tablet Take 1 tablet (20 mg total) by mouth 2 (two) times daily. For 12 weeks 60 tablet 2  . estradiol (ESTRACE) 1  MG tablet TAKE 1 TABLET EVERY DAY 100 tablet 2  . isosorbide mononitrate (IMDUR) 60 MG 24 hr tablet TAKE 1 TABLET (60 MG TOTAL) BY MOUTH DAILY. 90 tablet 3  . metoprolol succinate (TOPROL-XL) 25 MG 24 hr tablet Take 1 tablet (25 mg total) by mouth daily. 30 tablet 3  . metroNIDAZOLE (METROCREAM) 0.75 % cream Apply topically 2 (two) times daily as needed. To affected facial areas for rosacea 45 g 3  . OVER THE COUNTER MEDICATION Life Tones (uric acid cleanser)- has celery extract and other herbs    . pantoprazole (PROTONIX) 40 MG tablet Take 1 tablet (40 mg total) by mouth daily. 90 tablet 3  . sertraline (ZOLOFT) 50 MG tablet Take 1 tablet (50 mg total) by mouth daily. 90 tablet 3  . traZODone (DESYREL) 100 MG tablet Take 1 tablet (100 mg total) by mouth at bedtime. 90 tablet 3   No current facility-administered medications for this encounter.     BP (!) 109/47   Pulse 75   Wt 159 lb 6.4 oz (72.3 kg)   SpO2 99%   BMI 27.79 kg/m  General: NAD Neck: No JVD, no thyromegaly or thyroid nodule.  Lungs: Clear to auscultation bilaterally with normal respiratory effort. CV: Nondisplaced PMI.  Heart regular S1/S2, no S3/S4, no murmur.  No peripheral edema.  No carotid bruit.  Normal pedal pulses.  Abdomen: Soft, nontender, no hepatosplenomegaly, no distention.  Skin: Intact without lesions or rashes.  Neurologic: Alert and oriented x 3.  Psych: Normal affect. Extremities: No clubbing or cyanosis.  HEENT: Normal.    Assessment/Plan: 1. PVCs: Frequent PVCs on 10/15 holter.  Echo at that time with normal LV and RV systolic function.  Palpitations have increased in frequency recently but not associated with exertion.  No new stressors, no change in her usual activities.  - I will obtain a holter monitor to quantify PVCs, she had > 10% in the past.  - Increase Toprol XL to 25 mg daily.  2. Chest pain: Her symptoms may have been microvascular angina given the prominent exertional component.   Coronary CT angiogram in 2016 showed no CAD, calcium score 0.  - She is still on Imdur, and she has had no recent chest pain.    3. Pulmonary hypertension: PA systolic pressure 49 mmHg on echo in 11/15.  Normal LV and RV size/systolic function.  PFTs normal, no evidence for COPD.  CTA chest showed no acute PE.  Sleep study with no OSA.  Ina 4/16  showed no evidence for pulmonary hypertension and low filling pressures.  - Suspect that her echo must have been a false positive study.  4. Hyperlipidemia: Recent LDL was high at 150.  However, calcium score was 0 in 2016 which suggests overall low risk.  She had myalgias with simvastatin.  - She does not want to take a statin.  I think it  is reasonable to continue working on diet, she could try red yeast rice extract.   Followup in 1 year if holter not significantly abnormal.   Loralie Champagne 03/16/2018

## 2018-03-30 ENCOUNTER — Ambulatory Visit (INDEPENDENT_AMBULATORY_CARE_PROVIDER_SITE_OTHER): Payer: PPO

## 2018-03-30 DIAGNOSIS — I493 Ventricular premature depolarization: Secondary | ICD-10-CM | POA: Diagnosis not present

## 2018-04-23 ENCOUNTER — Other Ambulatory Visit: Payer: Self-pay | Admitting: *Deleted

## 2018-04-23 MED ORDER — DOXYCYCLINE HYCLATE 20 MG PO TABS: 20.0000 mg | ORAL_TABLET | Freq: Two times a day (BID) | ORAL | 0 refills | Status: DC

## 2018-04-23 NOTE — Telephone Encounter (Signed)
Last filled on 02/01/18 #60 tabs with 2 refills

## 2018-05-06 ENCOUNTER — Encounter (HOSPITAL_COMMUNITY): Payer: Self-pay

## 2018-05-06 ENCOUNTER — Telehealth (HOSPITAL_COMMUNITY): Payer: Self-pay

## 2018-05-06 NOTE — Telephone Encounter (Signed)
Notes recorded by Shirley Muscat, RN on 05/06/2018 at 12:46 PM EDT I have been unable to reach this patient by phone. A letter is being sent.  ------  Notes recorded by Shirley Muscat, RN on 05/04/2018 at 4:47 PM EDT Left VM  ------  Notes recorded by Shirley Muscat, RN on 04/19/2018 at 3:21 PM EDT Left VM  ------  Notes recorded by Larey Dresser, MD on 04/19/2018 at 12:18 AM EDT No significant abnormalities.

## 2018-05-20 ENCOUNTER — Other Ambulatory Visit: Payer: Self-pay | Admitting: *Deleted

## 2018-05-20 NOTE — Telephone Encounter (Signed)
I generally start with a 12 week course How is she doing with it  I would like to take a break for 1-2 mo to see how she does -and let me know

## 2018-05-20 NOTE — Telephone Encounter (Signed)
OV note from 12/01/18 says that pt was going to try med for 12 weeks and has been on it since that date. Please advise

## 2018-05-21 NOTE — Telephone Encounter (Signed)
Left VM requesting pt to call the office back CRM created 

## 2018-05-24 ENCOUNTER — Telehealth (HOSPITAL_COMMUNITY): Payer: Self-pay | Admitting: Cardiology

## 2018-05-24 NOTE — Telephone Encounter (Signed)
Patient returned call regarding letter received.   Letter mailed as we have been unsuccessful in reaching by phone in the past.  Detailed message left for patient regarding normal monitor results.

## 2018-06-08 ENCOUNTER — Ambulatory Visit (INDEPENDENT_AMBULATORY_CARE_PROVIDER_SITE_OTHER): Payer: PPO | Admitting: Family Medicine

## 2018-06-08 ENCOUNTER — Encounter: Payer: Self-pay | Admitting: Family Medicine

## 2018-06-08 VITALS — BP 110/72 | HR 78 | Temp 97.9°F | Ht 63.5 in | Wt 159.2 lb

## 2018-06-08 DIAGNOSIS — S93409A Sprain of unspecified ligament of unspecified ankle, initial encounter: Secondary | ICD-10-CM | POA: Diagnosis not present

## 2018-06-08 DIAGNOSIS — R300 Dysuria: Secondary | ICD-10-CM | POA: Diagnosis not present

## 2018-06-08 LAB — POC URINALSYSI DIPSTICK (AUTOMATED)
Bilirubin, UA: NEGATIVE
GLUCOSE UA: NEGATIVE
Ketones, UA: NEGATIVE
NITRITE UA: NEGATIVE
Protein, UA: NEGATIVE
Spec Grav, UA: 1.015 (ref 1.010–1.025)
UROBILINOGEN UA: 0.2 U/dL
pH, UA: 6 (ref 5.0–8.0)

## 2018-06-08 MED ORDER — NITROFURANTOIN MONOHYD MACRO 100 MG PO CAPS: 100.0000 mg | ORAL_CAPSULE | Freq: Two times a day (BID) | ORAL | 0 refills | Status: DC

## 2018-06-08 NOTE — Patient Instructions (Addendum)
Get an OTC lace up ankle brace and use that while weight bearing.  Drink plenty of water and start the antibiotics today.  We'll contact you with your lab report.  Take care.

## 2018-06-08 NOTE — Progress Notes (Signed)
Dysuria: Burning with urination.   duration of symptoms: about 8 days.  abdominal pain: no fevers:no back pain: at baseline, ongoing for months (midline lower T spine, worse with prolonged standing, sitting helps, radiating to the sides of the back but not around to the front of the thorax).  She has been putting up with that pain.  2/10 currently but variable pain levels prev.  No trauma.   Vomiting:no  She rolled L ankle a few weeks a few weeks ago, still with some sensation of instability but slowly getting better.  This was about 16 days ago.    Meds, vitals, and allergies reviewed.   Per HPI unless specifically indicated in ROS section   GEN: nad, alert and oriented NECK: supple CV: rrr.  PULM: ctab, no inc wob ABD: soft, +bs, suprapubic area not tender EXT: no edema SKIN: no acute rash BACK: no CVA pain L 5th MT and med/lat MT not ttp, able to bear weight. Normal DP pulse.

## 2018-06-09 DIAGNOSIS — S93409A Sprain of unspecified ligament of unspecified ankle, initial encounter: Secondary | ICD-10-CM | POA: Insufficient documentation

## 2018-06-09 DIAGNOSIS — R300 Dysuria: Secondary | ICD-10-CM | POA: Insufficient documentation

## 2018-06-09 NOTE — Assessment & Plan Note (Signed)
Get an OTC lace up ankle brace and use that while weight bearing. Update [place] as needed.   She agrees.  No need for imaging at this point.

## 2018-06-09 NOTE — Assessment & Plan Note (Signed)
Likely cystitis.  Nontoxic.  Start Macrobid.  Urinalysis discussed.  Check urine culture.  Okay for outpatient follow-up.

## 2018-06-10 LAB — URINE CULTURE
MICRO NUMBER:: 90787472
SPECIMEN QUALITY:: ADEQUATE

## 2018-06-15 ENCOUNTER — Other Ambulatory Visit (HOSPITAL_COMMUNITY): Payer: Self-pay | Admitting: Cardiology

## 2018-08-16 ENCOUNTER — Telehealth: Payer: Self-pay | Admitting: Family Medicine

## 2018-08-16 DIAGNOSIS — E559 Vitamin D deficiency, unspecified: Secondary | ICD-10-CM

## 2018-08-16 DIAGNOSIS — Z0000 Encounter for general adult medical examination without abnormal findings: Secondary | ICD-10-CM

## 2018-08-16 DIAGNOSIS — R7303 Prediabetes: Secondary | ICD-10-CM

## 2018-08-16 DIAGNOSIS — E7800 Pure hypercholesterolemia, unspecified: Secondary | ICD-10-CM

## 2018-08-16 NOTE — Telephone Encounter (Signed)
-----   Message from Eustace Pen, LPN sent at 06/15/5582  4:19 PM EDT ----- Regarding: Labs 9/13 Lab orders needed. Thank you.  Insurance:  [employer]

## 2018-08-19 ENCOUNTER — Telehealth: Payer: Self-pay | Admitting: Family Medicine

## 2018-08-19 DIAGNOSIS — Z0000 Encounter for general adult medical examination without abnormal findings: Secondary | ICD-10-CM

## 2018-08-19 DIAGNOSIS — E559 Vitamin D deficiency, unspecified: Secondary | ICD-10-CM

## 2018-08-19 DIAGNOSIS — E7800 Pure hypercholesterolemia, unspecified: Secondary | ICD-10-CM

## 2018-08-19 DIAGNOSIS — R7303 Prediabetes: Secondary | ICD-10-CM

## 2018-08-19 NOTE — Telephone Encounter (Signed)
-----   Message from Eustace Pen, LPN sent at 03/11/4618  4:19 PM EDT ----- Regarding: Labs 9/13 Lab orders needed. Thank you.  Insurance:  [employer]

## 2018-08-19 NOTE — Telephone Encounter (Signed)
Already done

## 2018-08-20 ENCOUNTER — Ambulatory Visit (INDEPENDENT_AMBULATORY_CARE_PROVIDER_SITE_OTHER): Payer: PPO

## 2018-08-20 VITALS — BP 90/60 | HR 68 | Temp 98.1°F | Ht 64.0 in | Wt 159.2 lb

## 2018-08-20 DIAGNOSIS — R7303 Prediabetes: Secondary | ICD-10-CM | POA: Diagnosis not present

## 2018-08-20 DIAGNOSIS — E7800 Pure hypercholesterolemia, unspecified: Secondary | ICD-10-CM | POA: Diagnosis not present

## 2018-08-20 DIAGNOSIS — E559 Vitamin D deficiency, unspecified: Secondary | ICD-10-CM

## 2018-08-20 DIAGNOSIS — Z0000 Encounter for general adult medical examination without abnormal findings: Secondary | ICD-10-CM

## 2018-08-20 LAB — COMPREHENSIVE METABOLIC PANEL
ALBUMIN: 3.9 g/dL (ref 3.5–5.2)
ALK PHOS: 75 U/L (ref 39–117)
ALT: 11 U/L (ref 0–35)
AST: 14 U/L (ref 0–37)
BUN: 8 mg/dL (ref 6–23)
CALCIUM: 8.9 mg/dL (ref 8.4–10.5)
CHLORIDE: 105 meq/L (ref 96–112)
CO2: 28 mEq/L (ref 19–32)
Creatinine, Ser: 0.89 mg/dL (ref 0.40–1.20)
GFR: 66.28 mL/min (ref >60.00)
Glucose, Bld: 113 mg/dL — ABNORMAL HIGH (ref 70–99)
POTASSIUM: 4 meq/L (ref 3.5–5.1)
SODIUM: 141 meq/L (ref 135–145)
TOTAL PROTEIN: 6.4 g/dL (ref 6.0–8.3)
Total Bilirubin: 0.5 mg/dL (ref 0.2–1.2)

## 2018-08-20 LAB — CBC WITH DIFFERENTIAL/PLATELET
BASOS ABS: 0 10*3/uL (ref 0.0–0.1)
Basophils Relative: 0.7 % (ref 0.0–3.0)
EOS PCT: 4.1 % (ref 0.0–5.0)
Eosinophils Absolute: 0.2 10*3/uL (ref 0.0–0.7)
HCT: 37.4 % (ref 36.0–46.0)
HEMOGLOBIN: 12.3 g/dL (ref 12.0–15.0)
LYMPHS PCT: 33 % (ref 12.0–46.0)
Lymphs Abs: 1.4 10*3/uL (ref 0.7–4.0)
MCHC: 32.9 g/dL (ref 30.0–36.0)
MCV: 85.4 fl (ref 78.0–100.0)
Monocytes Absolute: 0.3 10*3/uL (ref 0.1–1.0)
Monocytes Relative: 6.3 % (ref 3.0–12.0)
NEUTROS PCT: 55.9 % (ref 43.0–77.0)
Neutro Abs: 2.5 10*3/uL (ref 1.4–7.7)
Platelets: 263 10*3/uL (ref 150.0–400.0)
RBC: 4.38 Mil/uL (ref 3.87–5.11)
RDW: 13.5 % (ref 11.5–15.5)
WBC: 4.4 10*3/uL (ref 4.0–10.5)

## 2018-08-20 LAB — LIPID PANEL
CHOL/HDL RATIO: 4
CHOLESTEROL: 212 mg/dL — AB (ref 0–200)
HDL: 52.8 mg/dL (ref >39.00)
LDL Cholesterol: 129 mg/dL — ABNORMAL HIGH (ref 0–99)
NonHDL: 159.12
TRIGLYCERIDES: 149 mg/dL (ref 0.0–149.0)
VLDL: 29.8 mg/dL (ref 0.0–40.0)

## 2018-08-20 LAB — HEMOGLOBIN A1C: Hgb A1c MFr Bld: 5.4 % (ref 4.6–6.5)

## 2018-08-20 LAB — TSH: TSH: 1.47 u[IU]/mL (ref 0.35–4.50)

## 2018-08-20 LAB — VITAMIN D 25 HYDROXY (VIT D DEFICIENCY, FRACTURES): VITD: 82.25 ng/mL (ref 30.00–100.00)

## 2018-08-20 NOTE — Patient Instructions (Signed)
Mary Gordon , Thank you for taking time to come for your Medicare Wellness Visit. I appreciate your ongoing commitment to your health goals. Please review the following plan we discussed and let me know if I can assist you in the future.   These are the goals we discussed: Goals    . Increase physical activity     Starting 08/20/2018, I will continue to exercise for at least 120 min 2 days per week.        This is a list of the screening recommended for you and due dates:  Health Maintenance  Topic Date Due  . Flu Shot  03/09/2019*  . Tetanus Vaccine  08/09/2019  . Mammogram  02/13/2020  . Colon Cancer Screening  10/15/2021  . DEXA scan (bone density measurement)  Completed  .  Hepatitis C: One time screening is recommended by Center for Disease Control  (CDC) for  adults born from [birth year] through 1965.   Completed  . Pneumonia vaccines  Completed  *Topic was postponed. The date shown is not the original due date.   Preventive Care for Adults  A healthy lifestyle and preventive care can promote health and wellness. Preventive health guidelines for adults include the following key practices.  . A routine yearly physical is a good way to check with your health care provider about your health and preventive screening. It is a chance to share any concerns and updates on your health and to receive a thorough exam.  . Visit your dentist for a routine exam and preventive care every 6 months. Brush your teeth twice a day and floss once a day. Good oral hygiene prevents tooth decay and gum disease.  . The frequency of eye exams is based on your age, health, family medical history, use  of contact lenses, and other factors. Follow your health care provider's recommendations for frequency of eye exams.  . Eat a healthy diet. Foods like vegetables, fruits, whole grains, low-fat dairy products, and lean protein foods contain the nutrients you need without too many calories. Decrease your intake of  foods high in solid fats, added sugars, and salt. Eat the right amount of calories for you. Get information about a proper diet from your health care provider, if necessary.  . Regular physical exercise is one of the most important things you can do for your health. Most adults should get at least 150 minutes of moderate-intensity exercise (any activity that increases your heart rate and causes you to sweat) each week. In addition, most adults need muscle-strengthening exercises on 2 or more days a week.  Silver Sneakers may be a benefit available to you. To determine eligibility, you may visit the website: www.silversneakers.com or contact program at 604-029-6609 Mon-Fri between 8AM-8PM.   . Maintain a healthy weight. The body mass index (BMI) is a screening tool to identify possible weight problems. It provides an estimate of body fat based on height and weight. Your health care provider can find your BMI and can help you achieve or maintain a healthy weight.   For adults 20 years and older: ? A BMI below 18.5 is considered underweight. ? A BMI of 18.5 to 24.9 is normal. ? A BMI of 25 to 29.9 is considered overweight. ? A BMI of 30 and above is considered obese.   . Maintain normal blood lipids and cholesterol levels by exercising and minimizing your intake of saturated fat. Eat a balanced diet with plenty of fruit and vegetables. Blood tests  for lipids and cholesterol should begin at age 75 and be repeated every 5 years. If your lipid or cholesterol levels are high, you are over 50, or you are at high risk for heart disease, you may need your cholesterol levels checked more frequently. Ongoing high lipid and cholesterol levels should be treated with medicines if diet and exercise are not working.  . If you smoke, find out from your health care provider how to quit. If you do not use tobacco, please do not start.  . If you choose to drink alcohol, please do not consume more than 2 drinks per  day. One drink is considered to be 12 ounces (355 mL) of beer, 5 ounces (148 mL) of wine, or 1.5 ounces (44 mL) of liquor.  . If you are 27-81 years old, ask your health care provider if you should take aspirin to prevent strokes.  . Use sunscreen. Apply sunscreen liberally and repeatedly throughout the day. You should seek shade when your shadow is shorter than you. Protect yourself by wearing long sleeves, pants, a wide-brimmed hat, and sunglasses year round, whenever you are outdoors.  . Once a month, do a whole body skin exam, using a mirror to look at the skin on your back. Tell your health care provider of new moles, moles that have irregular borders, moles that are larger than a pencil eraser, or moles that have changed in shape or color.

## 2018-08-22 NOTE — Progress Notes (Signed)
Subjective:   Mary Gordon is a 72 y.o. female who presents for Medicare Annual (Subsequent) preventive examination.  Review of Systems:  N/A Cardiac Risk Factors include: advanced age (>64men, >63 women);dyslipidemia     Objective:     Vitals: BP 90/60 (BP Location: Right Arm, Patient Position: Sitting, Cuff Size: Normal)   Pulse 68   Temp 98.1 F (36.7 C) (Oral)   Ht 5\' 4"  (1.626 m) Comment: no shoes  Wt 159 lb 4 oz (72.2 kg)   SpO2 97%   BMI 27.34 kg/m   Body mass index is 27.34 kg/m.  Advanced Directives 08/20/2018 08/19/2017 07/14/2017 05/09/2017 08/18/2016 03/16/2015 01/08/2015  Does Patient Have a Medical Advance Directive? Yes Yes Yes No Yes Yes Yes  Type of Paramedic of [place];Living will [place];Living will Healthcare Power of [place];Living will Healthcare Power of [place];Living will  Does patient want to make changes to medical advance directive? - - - - No - Patient declined No - Patient declined No - Patient declined  Copy of [place] in Chart? No - copy requested No - copy requested - - Yes Yes Yes    Tobacco Social History   Tobacco Use  Smoking Status Former Smoker  . Packs/day: 1.00  . Years: 17.00  . Pack years: 17.00  . Types: Cigarettes  . Last attempt to quit: 12/08/1978  . Years since quitting: 39.7  Smokeless Tobacco Never Used     Counseling given: No   Clinical Intake:  Pre-visit preparation completed: Yes  Pain : No/denies pain Pain Score: 0-No pain     Nutritional Status: BMI 25 -29 Overweight Nutritional Risks: None Diabetes: No  How often do you need to have someone help you when you read instructions, pamphlets, or other written materials from your doctor or pharmacy?: 1 - Never What is the last grade level you completed in school?: 12th grade + 2 yrs college  Interpreter Needed?: No  Comments: pt  lives with spouse Information entered by :: LPinson, LPN  Past Medical History:  Diagnosis Date  . Allergy    all year  . Anxiety   . Bursitis of hip   . Cataract   . Cervical cancer ([place]) 1978  . Chronic kidney disease    RENAL INSUFF  . Depression   . Dysrhythmia    PVC'S  . Fibromyalgia   . GERD (gastroesophageal reflux disease)   . High cholesterol   . History of Clostridium difficile infection    In past from augmentin  . History of hiatal hernia   . Osteopenia   . Palpitations   . Panic disorder   . Pulmonary HTN ([place]) 10/20/2014   moderate with PASP 52mmHg  . Pulmonary nodule    Being observed  . Vitamin D deficiency    Past Surgical History:  Procedure Laterality Date  . ABDOMINAL HYSTERECTOMY  1978  . CARDIAC CATHETERIZATION    . CATARACT EXTRACTION W/PHACO Right 07/14/2017   Procedure: CATARACT EXTRACTION PHACO AND INTRAOCULAR LENS PLACEMENT (IOC);  Surgeon: Birder Robson, MD;  Location: [facility] ORS;  Service: Ophthalmology;  Laterality: Right;  [place] 00:31 AP% 15.7 CDE 4.97 Fluid pack lot # [id number]  . CATARACT EXTRACTION W/PHACO Left 08/04/2017   Procedure: CATARACT EXTRACTION PHACO AND INTRAOCULAR LENS PLACEMENT (IOC);  Surgeon: Birder Robson, MD;  Location: [facility] ORS;  Service: Ophthalmology;  Laterality: Left;  [place] 00:34 AP% 18.7  CDE 6.42 Fluid pack lot # [id number]  . COLONOSCOPY  2002   Normal  . HAND SURGERY  2005   Left hand thrombosis  . NASAL HEMORRHAGE CONTROL  2007  . RIGHT HEART CATHETERIZATION N/A 03/16/2015   Procedure: RIGHT HEART CATH;  Surgeon: Larey Dresser, MD;  Location: [facility] CATH LAB;  Service: Cardiovascular;  Laterality: N/A;  . SHOULDER SURGERY  2008   Right shoulder bone spur  . THYROGLOSSAL DUCT CYST     Family History  Problem Relation Age of Onset  . Heart attack Mother 48  . Emphysema Mother   . Allergies Mother   . Asthma Mother   . Allergies Father   . Glaucoma Father   . Arrhythmia Sister        Atrial fibrillation    . Cancer Sister        breast  . Breast cancer Sister   . Emphysema Sister   . Heart disease Sister   . Rheumatologic disease Sister   . Lung cancer Sister   . Diabetes Other        Parent  . Hyperlipidemia Other        Other relative  . Hypertension Other        Parent, other relative  . Breast cancer Other        Other relative, sister  . Allergies Other        Siblings  . Asthma Other        Sisters  . Colon cancer Neg Hx   . Esophageal cancer Neg Hx   . Rectal cancer Neg Hx   . Stomach cancer Neg Hx    Social History   Socioeconomic History  . Marital status: Married    Spouse name: Not on file  . Number of children: Not on file  . Years of education: Not on file  . Highest education level: Not on file  Occupational History  . Occupation: Retired from book keeping  Social Needs  . Financial resource strain: Not on file  . Food insecurity:    Worry: Not on file    Inability: Not on file  . Transportation needs:    Medical: Not on file    Non-medical: Not on file  Tobacco Use  . Smoking status: Former Smoker    Packs/day: 1.00    Years: 17.00    Pack years: 17.00    Types: Cigarettes    Last attempt to quit: 12/08/1978    Years since quitting: 39.7  . Smokeless tobacco: Never Used  Substance and Sexual Activity  . Alcohol use: No    Alcohol/week: 0.0 standard drinks  . Drug use: No  . Sexual activity: Yes    Partners: Male  Lifestyle  . Physical activity:    Days per week: Not on file    Minutes per session: Not on file  . Stress: Not on file  Relationships  . Social connections:    Talks on phone: Not on file    Gets together: Not on file    Attends religious service: Not on file    Active member of club or organization: Not on file    Attends meetings of clubs or organizations: Not on file    Relationship status: Not on file  Other Topics Concern  . Not on file  Social History Narrative   Married (husband is colon cancer survivor)   Does  not get regular exercise   G4P1    Outpatient  Encounter Medications as of 08/20/2018  Medication Sig  . chlorpheniramine (CHLOR-TRIMETON) 4 MG tablet Take 8 mg by mouth 2 (two) times daily as needed for allergies.  . Cholecalciferol (VITAMIN D3) 10000 units capsule Take 10,000 Units by mouth daily.  Marland Kitchen estradiol (ESTRACE) 1 MG tablet TAKE 1 TABLET EVERY DAY  . isosorbide mononitrate (IMDUR) 60 MG 24 hr tablet TAKE 1 TABLET (60 MG TOTAL) BY MOUTH DAILY.  . metoprolol succinate (TOPROL-XL) 25 MG 24 hr tablet TAKE 1 TABLET BY MOUTH EVERY DAY  . metroNIDAZOLE (METROCREAM) 0.75 % cream Apply topically 2 (two) times daily as needed. To affected facial areas for rosacea  . OVER THE COUNTER MEDICATION Life Tones (uric acid cleanser)- has celery extract and other herbs  . pantoprazole (PROTONIX) 40 MG tablet Take 1 tablet (40 mg total) by mouth daily.  . sertraline (ZOLOFT) 50 MG tablet Take 1 tablet (50 mg total) by mouth daily.  . traZODone (DESYREL) 100 MG tablet Take 1 tablet (100 mg total) by mouth at bedtime.  . [DISCONTINUED] nitrofurantoin, macrocrystal-monohydrate, (MACROBID) 100 MG capsule Take 1 capsule (100 mg total) by mouth 2 (two) times daily.   No facility-administered encounter medications on file as of 08/20/2018.     Activities of Daily Living In your present state of health, do you have any difficulty performing the following activities: 08/20/2018  Hearing? N  Vision? N  Difficulty concentrating or making decisions? N  Walking or climbing stairs? N  Dressing or bathing? N  Doing errands, shopping? N  Preparing Food and eating ? N  Using the Toilet? N  In the past six months, have you accidently leaked urine? N  Do you have problems with loss of bowel control? N  Managing your Medications? N  Managing your Finances? N  Housekeeping or managing your Housekeeping? N  Some recent data might be hidden    Patient Care Team: [name], Wynelle Fanny, MD as PCP - General Birder Robson, MD as Referring Physician (Ophthalmology) Larey Dresser, MD as Consulting Physician (Cardiology) Emily Filbert, MD as Consulting Physician (Obstetrics and Gynecology) Melida Quitter, MD as Consulting Physician (Otolaryngology)    Assessment:   This is a routine wellness examination for [place].   Hearing Screening   125Hz  250Hz  500Hz  1000Hz  2000Hz  3000Hz  4000Hz  6000Hz  8000Hz   Right ear:   40 40 40  40    Left ear:   40 40 40  0    Vision Screening Comments: Vision exam in 2019 with Dr. George Ina    Exercise Activities and Dietary recommendations Current Exercise Habits: Home exercise routine, Type of exercise: Other - see comments;strength training/weights(pickle ball), Time (Minutes): > 60(120 minutes), Frequency (Times/Week): 2, Weekly Exercise (Minutes/Week): 0, Intensity: Moderate, Exercise limited by: None identified  Goals    . Increase physical activity     Starting 08/20/2018, I will continue to exercise for at least 120 min 2 days per week.        Fall Risk Fall Risk  08/20/2018 08/19/2017 08/18/2016 03/24/2014  Falls in the past year? No No No No   Depression Screen PHQ 2/9 Scores 08/20/2018 08/19/2017 08/18/2016 03/24/2014  PHQ - 2 Score 0 0 0 0  PHQ- 9 Score 0 0 - -     Cognitive Function MMSE - Mini Mental State Exam 08/20/2018 08/19/2017 08/18/2016  Orientation to time 5 5 5   Orientation to Place 5 5 5   Registration 3 3 3   Attention/ Calculation 0 0 0  Recall 3  3 3  Language- name 2 objects 0 0 0  Language- repeat 1 1 1   Language- follow 3 step command 3 3 3   Language- read & follow direction 0 0 0  Write a sentence 0 0 0  Copy design 0 0 0  Total score 20 20 20        PLEASE NOTE: A Mini-Cog screen was completed. Maximum score is 20. A value of 0 denotes this part of [place] MMSE was not completed or the patient failed this part of the Mini-Cog screening.   Mini-Cog Screening Orientation to Time - Max 5 pts Orientation to Place - Max 5  pts Registration - Max 3 pts Recall - Max 3 pts Language Repeat - Max 1 pts Language Follow 3 Step Command - Max 3 pts   Immunization History  Administered Date(s) Administered  . Influenza Split 09/26/2011, 10/15/2012  . Influenza Whole 09/04/2010  . Influenza,inj,Quad PF,6+ Mos 09/06/2013, 09/08/2014, 09/17/2015, 08/18/2016, 08/19/2017  . Pneumococcal Conjugate-13 08/25/2016  . Pneumococcal Polysaccharide-23 10/07/2008, 08/19/2017  . Td 08/08/2009  . Zoster 12/08/2008    Screening Tests Health Maintenance  Topic Date Due  . INFLUENZA VACCINE  03/09/2019 (Originally 07/08/2018)  . TETANUS/TDAP  08/09/2019  . MAMMOGRAM  02/13/2020  . COLONOSCOPY  10/15/2021  . DEXA SCAN  Completed  . Hepatitis C Screening  Completed  . PNA vac Low Risk Adult  Completed       Plan:  I have personally reviewed, addressed, and noted the following in the patient's chart:  A. Medical and social history B. Use of alcohol, tobacco or illicit drugs  C. Current medications and supplements D. Functional ability and status E.  Nutritional status F.  Physical activity G. Advance directives H. List of other physicians I.  Hospitalizations, surgeries, and ER visits in previous 12 months J.  [place] to include hearing, vision, cognitive, depression L. Referrals and appointments - none  In addition, I have reviewed and discussed with patient certain preventive protocols, quality metrics, and best practice recommendations. A written personalized care plan for preventive services as well as general preventive health recommendations were provided to patient.  See attached scanned questionnaire for additional information.   Signed,   Lindell Noe, MHA, BS, LPN Health Coach

## 2018-08-22 NOTE — Progress Notes (Signed)
PCP notes:   Health maintenance:  Flu vaccine - postponed/insurance  Abnormal screenings:   Hearing - failed  Hearing Screening   125Hz  250Hz  500Hz  1000Hz  2000Hz  3000Hz  4000Hz  6000Hz  8000Hz   Right ear:   40 40 40  40    Left ear:   40 40 40  0     Patient concerns:   None  Nurse concerns:  None  Next PCP appt:   08/27/18 @ 0930  I reviewed health advisor's note, was available for consultation, and agree with documentation and plan. Loura Pardon MD

## 2018-08-27 ENCOUNTER — Encounter: Payer: Self-pay | Admitting: Family Medicine

## 2018-08-27 ENCOUNTER — Ambulatory Visit (INDEPENDENT_AMBULATORY_CARE_PROVIDER_SITE_OTHER): Payer: PPO | Admitting: Family Medicine

## 2018-08-27 VITALS — BP 118/64 | HR 65 | Temp 98.3°F | Ht 64.0 in | Wt 159.5 lb

## 2018-08-27 DIAGNOSIS — Z0000 Encounter for general adult medical examination without abnormal findings: Secondary | ICD-10-CM

## 2018-08-27 DIAGNOSIS — E7800 Pure hypercholesterolemia, unspecified: Secondary | ICD-10-CM | POA: Diagnosis not present

## 2018-08-27 DIAGNOSIS — E559 Vitamin D deficiency, unspecified: Secondary | ICD-10-CM | POA: Diagnosis not present

## 2018-08-27 DIAGNOSIS — I493 Ventricular premature depolarization: Secondary | ICD-10-CM

## 2018-08-27 DIAGNOSIS — M85851 Other specified disorders of bone density and structure, right thigh: Secondary | ICD-10-CM | POA: Diagnosis not present

## 2018-08-27 DIAGNOSIS — R7303 Prediabetes: Secondary | ICD-10-CM

## 2018-08-27 DIAGNOSIS — I272 Pulmonary hypertension, unspecified: Secondary | ICD-10-CM | POA: Diagnosis not present

## 2018-08-27 MED ORDER — SERTRALINE HCL 50 MG PO TABS: 50.0000 mg | ORAL_TABLET | Freq: Every day | ORAL | 3 refills | Status: DC

## 2018-08-27 MED ORDER — ISOSORBIDE MONONITRATE ER 60 MG PO TB24: ORAL_TABLET | ORAL | 3 refills | Status: DC

## 2018-08-27 MED ORDER — METRONIDAZOLE 0.75 % EX CREA: TOPICAL_CREAM | Freq: Two times a day (BID) | CUTANEOUS | 3 refills | Status: DC | PRN

## 2018-08-27 MED ORDER — PANTOPRAZOLE SODIUM 40 MG PO TBEC: 40.0000 mg | DELAYED_RELEASE_TABLET | Freq: Every day | ORAL | 3 refills | Status: DC

## 2018-08-27 MED ORDER — TRAZODONE HCL 100 MG PO TABS: 100.0000 mg | ORAL_TABLET | Freq: Every day | ORAL | 3 refills | Status: DC

## 2018-08-27 NOTE — Patient Instructions (Addendum)
To reduce sugar intake Try to get most of your carbohydrates from produce (with the exception of white potatoes)  Eat less bread/pasta/rice/snack foods/cereals/sweets and other items from the middle of the grocery store (processed carbs)   Get your flu shot next month   Keep taking good care of yourself

## 2018-08-27 NOTE — Progress Notes (Signed)
Subjective:    Patient ID: Mary Gordon, female    DOB: 1946/03/26, 72 y.o.   MRN: [id number]  HPI Here for health maintenance exam and to review chronic medical problems    Feeling good   Wt Readings from Last 3 Encounters:  08/27/18 159 lb 8 oz (72.3 kg)  08/20/18 159 lb 4 oz (72.2 kg)  06/08/18 159 lb 4 oz (72.2 kg)  wt is down - exercising and eating well  Wants to do better with diet (biggest goal is to reduce sugar)  27.38 kg/m    Had amw on 9/13 Missed high tone in L ear only (she notices more issues with low tones actually)   Flu shot - wants to wait a little longer   Mammogram 3/19-nl  Self breast exam -no lumps  Sister had breast cancer    Colonoscopy 11/12 with 10 y recall   dexa 1/17 -mild ostepenia and RFN D level is 82 No falls or fractures  Wants to hold off on a dexa this year    BP BP Readings from Last 3 Encounters:  08/27/18 118/64  08/20/18 90/60  06/08/18 110/72   Pulse Readings from Last 3 Encounters:  08/27/18 65  08/20/18 68  06/08/18 78   H/o PVCs- well controlled   zostavax 1/10   Hyperlipidemia Lab Results  Component Value Date   CHOL 212 (H) 08/20/2018   CHOL 256 (H) 08/19/2017   CHOL 208 (H) 08/19/2016   Lab Results  Component Value Date   HDL 52.80 08/20/2018   HDL 87.60 08/19/2017   HDL 54.20 08/19/2016   Lab Results  Component Value Date   LDLCALC 129 (H) 08/20/2018   LDLCALC 150 (H) 08/19/2017   LDLCALC 130 (H) 08/19/2016   Lab Results  Component Value Date   TRIG 149.0 08/20/2018   TRIG 89.0 08/19/2017   TRIG 119.0 08/19/2016   Lab Results  Component Value Date   CHOLHDL 4 08/20/2018   CHOLHDL 3 08/19/2017   CHOLHDL 4 08/19/2016   Lab Results  Component Value Date   LDLDIRECT 161.5 10/24/2013   LDLDIRECT 153.0 10/15/2012  LDL is down significantly  Does not take fish oil  Intolerant to statins    Prediabetes Lab Results  Component Value Date   HGBA1C 5.4 08/20/2018  good /not in  diabetic range   Lab Results  Component Value Date   CREATININE 0.89 08/20/2018   BUN 8 08/20/2018   NA 141 08/20/2018   K 4.0 08/20/2018   CL 105 08/20/2018   CO2 28 08/20/2018   Lab Results  Component Value Date   ALT 11 08/20/2018   AST 14 08/20/2018   ALKPHOS 75 08/20/2018   BILITOT 0.5 08/20/2018    Lab Results  Component Value Date   WBC 4.4 08/20/2018   HGB 12.3 08/20/2018   HCT 37.4 08/20/2018   MCV 85.4 08/20/2018   PLT 263.0 08/20/2018   Lab Results  Component Value Date   TSH 1.47 08/20/2018     Patient Active Problem List   Diagnosis Date Noted  . Dysuria 06/09/2018  . Sprain of ankle 06/09/2018  . Laryngopharyngeal reflux (LPR) 01/08/2017  . Pharyngeal dysphagia 01/08/2017  . Neck pain on left side 09/01/2016  . Prediabetes 08/25/2016  . Routine general medical examination at a health care facility 08/19/2016  . Stress reaction 09/17/2015  . 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 Rosacea 12/27/2014  . Pulmonary HTN ([place]) 10/20/2014  . DOE (dyspnea on  exertion) 09/26/2014  . PVCs (premature ventricular contractions) 09/26/2014  . Encounter for Medicare annual wellness exam 03/18/2014  . Fibromyalgia 05/31/2012  . Special screening for malignant neoplasms, colon 09/26/2011  . GERD (gastroesophageal reflux disease) 07/08/2011  . CERVICAL RADICULOPATHY, RIGHT 09/04/2010  . PULMONARY NODULE 09/14/2009  . PALPITATIONS 08/17/2009  . Vitamin D deficiency 02/22/2009  . HYPERCHOLESTEROLEMIA 02/22/2009  . Adjustment disorder with mixed anxiety and depressed mood 02/22/2009  . ALLERGIC RHINITIS 02/22/2009  . MENOPAUSAL SYNDROME 02/22/2009  . Osteopenia 02/22/2009   Past Medical History:  Diagnosis Date  . Allergy    all year  . Anxiety   . Bursitis of hip   . Cataract   . Cervical cancer ([place]) 1978  . Chronic kidney disease    RENAL INSUFF  . Depression   . Dysrhythmia    PVC'S  . Fibromyalgia   . GERD (gastroesophageal reflux disease)   .  High cholesterol   . History of Clostridium difficile infection    In past from augmentin  . History of hiatal hernia   . Osteopenia   . Palpitations   . Panic disorder   . Pulmonary HTN ([place]) 10/20/2014   moderate with PASP 40mmHg  . Pulmonary nodule    Being observed  . Vitamin D deficiency    Past Surgical History:  Procedure Laterality Date  . ABDOMINAL HYSTERECTOMY  1978  . CARDIAC CATHETERIZATION    . CATARACT EXTRACTION W/PHACO Right 07/14/2017   Procedure: CATARACT EXTRACTION PHACO AND INTRAOCULAR LENS PLACEMENT (IOC);  Surgeon: Birder Robson, MD;  Location: [facility] ORS;  Service: Ophthalmology;  Laterality: Right;  [place] 00:31 AP% 15.7 CDE 4.97 Fluid pack lot # [id number]  . CATARACT EXTRACTION W/PHACO Left 08/04/2017   Procedure: CATARACT EXTRACTION PHACO AND INTRAOCULAR LENS PLACEMENT (IOC);  Surgeon: Birder Robson, MD;  Location: [facility] ORS;  Service: Ophthalmology;  Laterality: Left;  [place] 00:34 AP% 18.7 CDE 6.42 Fluid pack lot # [id number]  . COLONOSCOPY  2002   Normal  . HAND SURGERY  2005   Left hand thrombosis  . NASAL HEMORRHAGE CONTROL  2007  . RIGHT HEART CATHETERIZATION N/A 03/16/2015   Procedure: RIGHT HEART CATH;  Surgeon: Larey Dresser, MD;  Location: [facility] CATH LAB;  Service: Cardiovascular;  Laterality: N/A;  . SHOULDER SURGERY  2008   Right shoulder bone spur  . THYROGLOSSAL DUCT CYST     Social History   Tobacco Use  . Smoking status: Former Smoker    Packs/day: 1.00    Years: 17.00    Pack years: 17.00    Types: Cigarettes    Last attempt to quit: 12/08/1978    Years since quitting: 39.7  . Smokeless tobacco: Never Used  Substance Use Topics  . Alcohol use: No    Alcohol/week: 0.0 standard drinks  . Drug use: No   Family History  Problem Relation Age of Onset  . Heart attack Mother 110  . Emphysema Mother   . Allergies Mother   . Asthma Mother   . Allergies Father   . Glaucoma Father   . Arrhythmia Sister        Atrial fibrillation  .  Cancer Sister        breast  . Breast cancer Sister   . Emphysema Sister   . Heart disease Sister   . Rheumatologic disease Sister   . Lung cancer Sister   . Diabetes Other        Parent  . Hyperlipidemia Other  Other relative  . Hypertension Other        Parent, other relative  . Breast cancer Other        Other relative, sister  . Allergies Other        Siblings  . Asthma Other        Sisters  . Colon cancer Neg Hx   . Esophageal cancer Neg Hx   . Rectal cancer Neg Hx   . Stomach cancer Neg Hx    Allergies  Allergen Reactions  . Adhesive [Tape] Rash    Burns skins   . Amoxicillin-Pot Clavulanate Other (See Comments)    Cdiff  . Simvastatin     myalgias   Current Outpatient Medications on File Prior to Visit  Medication Sig Dispense Refill  . chlorpheniramine (CHLOR-TRIMETON) 4 MG tablet Take 8 mg by mouth 2 (two) times daily as needed for allergies.    . Cholecalciferol (VITAMIN D3) 10000 units capsule Take 10,000 Units by mouth daily.    Marland Kitchen estradiol (ESTRACE) 1 MG tablet TAKE 1 TABLET EVERY DAY 100 tablet 2  . metoprolol succinate (TOPROL-XL) 25 MG 24 hr tablet TAKE 1 TABLET BY MOUTH EVERY DAY 90 tablet 1  . OVER THE COUNTER MEDICATION Life Tones (uric acid cleanser)- has celery extract and other herbs     No current facility-administered medications on file prior to visit.     Review of Systems  Constitutional: Negative for activity change, appetite change, fatigue, fever and unexpected weight change.  HENT: Negative for congestion, ear pain, rhinorrhea, sinus pressure and sore throat.   Eyes: Negative for pain, redness and visual disturbance.  Respiratory: Negative for cough, shortness of breath and wheezing.   Cardiovascular: Negative for chest pain and palpitations.  Gastrointestinal: Negative for abdominal pain, blood in stool, constipation and diarrhea.  Endocrine: Negative for polydipsia and polyuria.  Genitourinary: Negative for dysuria, frequency  and urgency.  Musculoskeletal: Negative for arthralgias, back pain and myalgias.  Skin: Negative for pallor and rash.  Allergic/Immunologic: Negative for environmental allergies.  Neurological: Negative for dizziness, syncope and headaches.  Hematological: Negative for adenopathy. Does not bruise/bleed easily.  Psychiatric/Behavioral: Negative for decreased concentration and dysphoric mood. The patient is not nervous/anxious.        Objective:   Physical Exam  Constitutional: She appears well-developed and well-nourished. No distress.  overwt and well appearing   HENT:  Head: Normocephalic and atraumatic.  Right Ear: External ear normal.  Left Ear: External ear normal.  Mouth/Throat: Oropharynx is clear and moist.  Clears throat occasionally  Boggy nares   Eyes: Pupils are equal, round, and reactive to light. Conjunctivae and EOM are normal. No scleral icterus.  Neck: Normal range of motion. Neck supple. No JVD present. Carotid bruit is not present. No thyromegaly present.  Cardiovascular: Normal rate, regular rhythm and intact distal pulses. Exam reveals gallop.  Pulmonary/Chest: Effort normal and breath sounds normal. No respiratory distress. She has no wheezes. She exhibits no tenderness. No breast tenderness, discharge or bleeding.  Abdominal: Soft. Bowel sounds are normal. She exhibits no distension, no abdominal bruit and no mass. There is no tenderness.  Genitourinary: No breast tenderness, discharge or bleeding.  Genitourinary Comments: Breast exam: No mass, nodules, thickening, tenderness, bulging, retraction, inflamation, nipple discharge or skin changes noted.  No axillary or clavicular LA.      Musculoskeletal: Normal range of motion. She exhibits no edema or tenderness.  No kyphosis   Lymphadenopathy:    She has no cervical  adenopathy.  Neurological: She is alert. She has normal reflexes. She displays normal reflexes. No cranial nerve deficit. She exhibits normal muscle  tone. Coordination normal.  Skin: Skin is warm and dry. No rash noted. No erythema. No pallor.  Solar lentigines diffusely   Psychiatric: She has a normal mood and affect.  Pleasant           Assessment & Plan:   Problem List Items Addressed This Visit      Cardiovascular and Mediastinum   Pulmonary HTN ([place])    Not symptomatic       Relevant Medications   isosorbide mononitrate (IMDUR) 60 MG 24 hr tablet   PVCs (premature ventricular contractions)    Well controlled with metoprolol      Relevant Medications   isosorbide mononitrate (IMDUR) 60 MG 24 hr tablet     Musculoskeletal and Integument   Osteopenia    dexa 2017 No falls or fx  Wants to wait a year for next dexa D level is 82          Other   HYPERCHOLESTEROLEMIA    Intolerant of statins Disc goals for lipids and reasons to control them Rev last labs with pt Rev low sat fat diet in detail LDL is down with better diet to 120s       Relevant Medications   isosorbide mononitrate (IMDUR) 60 MG 24 hr tablet   Prediabetes    Lab Results  Component Value Date   HGBA1C 5.4 08/20/2018   disc imp of low glycemic diet and wt loss to prevent DM2       Routine general medical examination at a health care facility - Primary    Reviewed health habits including diet and exercise and skin cancer prevention Reviewed appropriate screening tests for age  Also reviewed health mt list, fam hx and immunization status , as well as social and family history   See HPI Labs rev amw rev  Pt wants to wait another year on dexa  Will get a flu shott next month       Vitamin D deficiency    Vitamin D level is therapeutic with current supplementation Disc importance of this to bone and overall health Level of 82

## 2018-08-29 NOTE — Assessment & Plan Note (Signed)
Well controlled with metoprolol.    

## 2018-08-29 NOTE — Assessment & Plan Note (Signed)
Intolerant of statins Disc goals for lipids and reasons to control them Rev last labs with pt Rev low sat fat diet in detail LDL is down with better diet to 120s

## 2018-08-29 NOTE — Assessment & Plan Note (Signed)
Vitamin D level is therapeutic with current supplementation Disc importance of this to bone and overall health Level of 82

## 2018-08-29 NOTE — Assessment & Plan Note (Signed)
dexa 2017 No falls or fx  Wants to wait a year for next dexa D level is 82

## 2018-08-29 NOTE — Assessment & Plan Note (Signed)
Lab Results  Component Value Date   HGBA1C 5.4 08/20/2018   disc imp of low glycemic diet and wt loss to prevent DM2

## 2018-08-29 NOTE — Assessment & Plan Note (Signed)
Reviewed health habits including diet and exercise and skin cancer prevention Reviewed appropriate screening tests for age  Also reviewed health mt list, fam hx and immunization status , as well as social and family history   See HPI Labs rev amw rev  Pt wants to wait another year on dexa  Will get a flu shott next month

## 2018-08-29 NOTE — Assessment & Plan Note (Signed)
Not symptomatic  

## 2018-11-17 ENCOUNTER — Other Ambulatory Visit: Payer: Self-pay | Admitting: Obstetrics & Gynecology

## 2018-11-17 DIAGNOSIS — E8940 Asymptomatic postprocedural ovarian failure: Secondary | ICD-10-CM

## 2018-11-17 DIAGNOSIS — Z79890 Hormone replacement therapy (postmenopausal): Principal | ICD-10-CM

## 2018-11-18 ENCOUNTER — Ambulatory Visit (INDEPENDENT_AMBULATORY_CARE_PROVIDER_SITE_OTHER): Payer: PPO

## 2018-11-18 DIAGNOSIS — Z23 Encounter for immunization: Secondary | ICD-10-CM | POA: Diagnosis not present

## 2018-11-29 ENCOUNTER — Other Ambulatory Visit (HOSPITAL_COMMUNITY): Payer: Self-pay | Admitting: Cardiology

## 2018-12-08 ENCOUNTER — Emergency Department (HOSPITAL_COMMUNITY)
Admission: EM | Admit: 2018-12-08 | Discharge: 2018-12-08 | Disposition: A | Discharge status: Home / Self Care | Payer: PPO | Attending: Emergency Medicine | Admitting: Emergency Medicine

## 2018-12-08 ENCOUNTER — Emergency Department (HOSPITAL_COMMUNITY): Payer: PPO

## 2018-12-08 ENCOUNTER — Encounter (HOSPITAL_COMMUNITY): Payer: Self-pay | Admitting: Emergency Medicine

## 2018-12-08 DIAGNOSIS — E7800 Pure hypercholesterolemia, unspecified: Secondary | ICD-10-CM | POA: Diagnosis not present

## 2018-12-08 DIAGNOSIS — Z79899 Other long term (current) drug therapy: Secondary | ICD-10-CM | POA: Insufficient documentation

## 2018-12-08 DIAGNOSIS — R079 Chest pain, unspecified: Secondary | ICD-10-CM | POA: Diagnosis not present

## 2018-12-08 DIAGNOSIS — I272 Pulmonary hypertension, unspecified: Secondary | ICD-10-CM | POA: Diagnosis not present

## 2018-12-08 DIAGNOSIS — R42 Dizziness and giddiness: Secondary | ICD-10-CM | POA: Diagnosis not present

## 2018-12-08 DIAGNOSIS — R0789 Other chest pain: Secondary | ICD-10-CM | POA: Diagnosis not present

## 2018-12-08 DIAGNOSIS — R61 Generalized hyperhidrosis: Secondary | ICD-10-CM | POA: Diagnosis not present

## 2018-12-08 DIAGNOSIS — R110 Nausea: Secondary | ICD-10-CM | POA: Diagnosis not present

## 2018-12-08 DIAGNOSIS — R0602 Shortness of breath: Secondary | ICD-10-CM | POA: Diagnosis not present

## 2018-12-08 DIAGNOSIS — Z87891 Personal history of nicotine dependence: Secondary | ICD-10-CM | POA: Diagnosis not present

## 2018-12-08 DIAGNOSIS — N289 Disorder of kidney and ureter, unspecified: Secondary | ICD-10-CM | POA: Diagnosis not present

## 2018-12-08 DIAGNOSIS — R51 Headache: Secondary | ICD-10-CM | POA: Diagnosis not present

## 2018-12-08 DIAGNOSIS — R531 Weakness: Secondary | ICD-10-CM | POA: Diagnosis not present

## 2018-12-08 LAB — I-STAT TROPONIN, ED
Troponin i, poc: 0 ng/mL (ref 0.00–0.08)
Troponin i, poc: 0 ng/mL (ref 0.00–0.08)
Troponin i, poc: 0.01 ng/mL (ref 0.00–0.08)

## 2018-12-08 LAB — CBC
HCT: 44.2 % (ref 36.0–46.0)
Hemoglobin: 13.7 g/dL (ref 12.0–15.0)
MCH: 27.8 pg (ref 26.0–34.0)
MCHC: 31 g/dL (ref 30.0–36.0)
MCV: 89.8 fL (ref 80.0–100.0)
Platelets: 275 10*3/uL (ref 150–400)
RBC: 4.92 MIL/uL (ref 3.87–5.11)
RDW: 12.8 % (ref 11.5–15.5)
WBC: 5.9 10*3/uL (ref 4.0–10.5)
nRBC: 0 % (ref 0.0–0.2)

## 2018-12-08 LAB — COMPREHENSIVE METABOLIC PANEL
ALT: 14 U/L (ref 0–44)
AST: 22 U/L (ref 15–41)
Albumin: 3.9 g/dL (ref 3.5–5.0)
Alkaline Phosphatase: 72 U/L (ref 38–126)
Anion gap: 11 (ref 5–15)
BUN: 8 mg/dL (ref 8–23)
CO2: 23 mmol/L (ref 22–32)
Calcium: 9.2 mg/dL (ref 8.9–10.3)
Chloride: 106 mmol/L (ref 98–111)
Creatinine, Ser: 0.89 mg/dL (ref 0.44–1.00)
Glucose, Bld: 109 mg/dL — ABNORMAL HIGH (ref 70–99)
Potassium: 3.9 mmol/L (ref 3.5–5.1)
Sodium: 140 mmol/L (ref 135–145)
Total Bilirubin: 1 mg/dL (ref 0.3–1.2)
Total Protein: 6.7 g/dL (ref 6.5–8.1)

## 2018-12-08 MED ORDER — LORAZEPAM 2 MG/ML IJ SOLN: 1.0000 mg | Freq: Once | INTRAMUSCULAR | Status: DC | PRN

## 2018-12-08 MED ORDER — IOPAMIDOL (ISOVUE-370) INJECTION 76%
125.0000 mL | Freq: Once | INTRAVENOUS | Status: AC | PRN
  Administered 2018-12-08: 125 mL via INTRAVENOUS

## 2018-12-08 MED ORDER — MECLIZINE HCL 25 MG PO TABS: 25.0000 mg | ORAL_TABLET | Freq: Once | ORAL | Status: DC

## 2018-12-08 MED ORDER — SODIUM CHLORIDE 0.9 % IV BOLUS
1000.0000 mL | Freq: Once | INTRAVENOUS | Status: AC
  Administered 2018-12-08: 1000 mL via INTRAVENOUS

## 2018-12-08 MED ORDER — IOPAMIDOL (ISOVUE-370) INJECTION 76%
INTRAVENOUS | Status: AC
  Filled 2018-12-08: qty 100

## 2018-12-08 NOTE — ED Provider Notes (Addendum)
[facility] EMERGENCY DEPARTMENT Provider Note   CSN: [id number] Arrival date & time: 12/08/18  1005     History   Chief Complaint Chief Complaint  Patient presents with  . Chest Pain    HPI Mary Gordon is a 73 y.o. female with a history of pulmonary hypertension, CKD, PVCs, GERD, HLD, anxiety, and C. difficile who presents to the emergency department with a chief complaint of chest pain.  The patient reports episodes of nonradiating, central, "pressure-like" chest pain accompanied by dizziness, lightheadedness, diaphoresis, feeling weak, shortness of breath, nausea, and "swimmy headed".  She reports the first episode began at approximately 3:45 AM.  She states "it felt like an elephant was sitting on my chest."  States the episode lasted for approximately 30 minutes before it subsided.  States that she has a history of GERD, but her symptoms have never felt similar, but took an antacid without improvement.  She reports that she was able to fall back asleep for some time.  She reports that she awoke around 8:30 AM and initially felt symptomatic.  States that she got up and started walking to the restroom and the constellation of symptoms returned so she called 911.  She was given 324 mg of aspirin in route by EMS.  She denies fever, chills, weakness, numbness, facial droop, slurred speech, palpitations, orthopnea, lower extremity swelling, vomiting, abdominal pain, back pain, cough, URI symptoms, congestion, neck pain, or left arm pain.  Upon arrival to the ED, she continues to endorse that "her head doesn't feel quite right. Not a headache. Just not right."  All other symptoms have since resolved.  Per chart review, she had ETT-Cardiolite done in 11/15 for the chest pressure.  This showed no ischemia or infarction.  Echo done in 11/15 also showed normal EF but PA systolic pressure was elevated at 49 mmHg.  The RV appeared normal.  She then had PFTs done in 12/15 that  were normal.  She saw Dr. Gwenette Greet and is not thought to have COPD or asthma.  She had a CTA chest in 11/15 showing no acute findings, no PE.  Sleep study showed no OSA.  Given the palpitations, she had holter in 10/15, showing frequent PVCs (12.5% total beats).  She was started on Toprol XL with almost complete resolution of palpitations.  Right heart cath in April 2016 did not demonstrate pulmonary hypertension or low filling pressures. No history of left heart cath.  The history is provided by the patient. No language interpreter was used.    Past Medical History:  Diagnosis Date  . Allergy    all year  . Anxiety   . Bursitis of hip   . Cataract   . Cervical cancer ([place]) 1978  . Chronic kidney disease    RENAL INSUFF  . Depression   . Dysrhythmia    PVC'S  . Fibromyalgia   . GERD (gastroesophageal reflux disease)   . High cholesterol   . History of Clostridium difficile infection    In past from augmentin  . History of hiatal hernia   . Osteopenia   . Palpitations   . Panic disorder   . Pulmonary HTN ([place]) 10/20/2014   moderate with PASP 62mmHg  . Pulmonary nodule    Being observed  . Vitamin D deficiency     Patient Active Problem List   Diagnosis Date Noted  . Dysuria 06/09/2018  . Sprain of ankle 06/09/2018  . Laryngopharyngeal reflux (LPR) 01/08/2017  .  Pharyngeal dysphagia 01/08/2017  . Prediabetes 08/25/2016  . Routine general medical examination at a health care facility 08/19/2016  . Stress reaction 09/17/2015  . 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 Rosacea 12/27/2014  . Pulmonary HTN ([place]) 10/20/2014  . DOE (dyspnea on exertion) 09/26/2014  . PVCs (premature ventricular contractions) 09/26/2014  . Encounter for Medicare annual wellness exam 03/18/2014  . Fibromyalgia 05/31/2012  . Special screening for malignant neoplasms, colon 09/26/2011  . GERD (gastroesophageal reflux disease) 07/08/2011  . CERVICAL RADICULOPATHY, RIGHT 09/04/2010  . PULMONARY  NODULE 09/14/2009  . PALPITATIONS 08/17/2009  . Vitamin D deficiency 02/22/2009  . HYPERCHOLESTEROLEMIA 02/22/2009  . Adjustment disorder with mixed anxiety and depressed mood 02/22/2009  . ALLERGIC RHINITIS 02/22/2009  . MENOPAUSAL SYNDROME 02/22/2009  . Osteopenia 02/22/2009    Past Surgical History:  Procedure Laterality Date  . ABDOMINAL HYSTERECTOMY  1978  . CARDIAC CATHETERIZATION    . CATARACT EXTRACTION W/PHACO Right 07/14/2017   Procedure: CATARACT EXTRACTION PHACO AND INTRAOCULAR LENS PLACEMENT (IOC);  Surgeon: Birder Robson, MD;  Location: [facility] ORS;  Service: Ophthalmology;  Laterality: Right;  [place] 00:31 AP% 15.7 CDE 4.97 Fluid pack lot # [id number]  . CATARACT EXTRACTION W/PHACO Left 08/04/2017   Procedure: CATARACT EXTRACTION PHACO AND INTRAOCULAR LENS PLACEMENT (IOC);  Surgeon: Birder Robson, MD;  Location: [facility] ORS;  Service: Ophthalmology;  Laterality: Left;  [place] 00:34 AP% 18.7 CDE 6.42 Fluid pack lot # [id number]  . COLONOSCOPY  2002   Normal  . HAND SURGERY  2005   Left hand thrombosis  . NASAL HEMORRHAGE CONTROL  2007  . RIGHT HEART CATHETERIZATION N/A 03/16/2015   Procedure: RIGHT HEART CATH;  Surgeon: Larey Dresser, MD;  Location[facility] CATH LAB;  Service: Cardiovascular;  Laterality: N/A;  . SHOULDER SURGERY  2008   Right shoulder bone spur  . THYROGLOSSAL DUCT CYST       OB History    Gravida  4   Para  1   Term      Preterm      AB      Living        SAB      TAB      Ectopic      Multiple      Live Births               Home Medications    Prior to Admission medications   Medication Sig Start Date End Date Taking? Authorizing Provider  chlorpheniramine (CHLOR-TRIMETON) 4 MG tablet Take 8 mg by mouth 2 (two) times daily.    Yes [provider]  Cholecalciferol (VITAMIN D3) 10000 units capsule Take 10,000 Units by mouth daily.   Yes [provider]  estradiol (ESTRACE) 1 MG tablet TAKE 1 TABLET EVERY  DAY Patient taking differently: Take 1 mg by mouth daily.  11/17/18  Yes Dove, Myra C, MD  isosorbide mononitrate (IMDUR) 60 MG 24 hr tablet TAKE 1 TABLET (60 MG TOTAL) BY MOUTH DAILY. Patient taking differently: Take 60 mg by mouth daily.  08/27/18  Yes [name], Wynelle Fanny, MD  Krill Oil 500 MG CAPS Take 500 mg by mouth daily.   Yes [provider]  metoprolol succinate (TOPROL-XL) 25 MG 24 hr tablet TAKE 1 TABLET BY MOUTH EVERY DAY Patient taking differently: Take 25 mg by mouth at bedtime.  11/29/18  Yes Bensimhon, Shaune Pascal, MD  metroNIDAZOLE (METROCREAM) 0.75 % cream Apply topically 2 (two) times daily as needed. To affected facial  areas for rosacea Patient taking differently: Apply 1 application topically 2 (two) times daily as needed (to affected facial areas for rosacea).  08/27/18  Yes [name], Wynelle Fanny, MD  OVER THE COUNTER MEDICATION Take 5-7 mLs by mouth See admin instructions. Life Tones (uric acid cleanser- has celery extract and other herbs) liquid: Take 5-7 ml's by mouth once a day   Yes [provider]  pantoprazole (PROTONIX) 40 MG tablet Take 1 tablet (40 mg total) by mouth daily. Patient taking differently: Take 40 mg by mouth daily as needed (for reflux).  08/27/18  Yes [name], Wynelle Fanny, MD  sertraline (ZOLOFT) 50 MG tablet Take 1 tablet (50 mg total) by mouth daily. 08/27/18  Yes [name], Wynelle Fanny, MD  traZODone (DESYREL) 100 MG tablet Take 1 tablet (100 mg total) by mouth at bedtime. 08/27/18  Yes [name], Wynelle Fanny, MD    Family History Family History  Problem Relation Age of Onset  . Heart attack Mother 40  . Emphysema Mother   . Allergies Mother   . Asthma Mother   . Allergies Father   . Glaucoma Father   . Arrhythmia Sister        Atrial fibrillation  . Cancer Sister        breast  . Breast cancer Sister   . Emphysema Sister   . Heart disease Sister   . Rheumatologic disease Sister   . Lung cancer Sister   . Diabetes Other        Parent  . Hyperlipidemia  Other        Other relative  . Hypertension Other        Parent, other relative  . Breast cancer Other        Other relative, sister  . Allergies Other        Siblings  . Asthma Other        Sisters  . Colon cancer Neg Hx   . Esophageal cancer Neg Hx   . Rectal cancer Neg Hx   . Stomach cancer Neg Hx     Social History Social History   Tobacco Use  . Smoking status: Former Smoker    Packs/day: 1.00    Years: 17.00    Pack years: 17.00    Types: Cigarettes    Last attempt to quit: 12/08/1978    Years since quitting: 40.0  . Smokeless tobacco: Never Used  Substance Use Topics  . Alcohol use: No    Alcohol/week: 0.0 standard drinks  . Drug use: No     Allergies   Adhesive [tape]; Amoxicillin-pot clavulanate; and Simvastatin   Review of Systems Review of Systems  Constitutional: Positive for diaphoresis. Negative for activity change, chills and fever.  HENT: Negative for congestion, sinus pressure and sinus pain.   Respiratory: Positive for shortness of breath. Negative for wheezing and stridor.   Cardiovascular: Positive for chest pain. Negative for palpitations and leg swelling.  Gastrointestinal: Positive for nausea. Negative for abdominal pain, constipation, diarrhea and vomiting.  Genitourinary: Negative for decreased urine volume, dysuria, frequency and urgency.  Musculoskeletal: Negative for back pain, neck pain and neck stiffness.  Skin: Negative for rash.  Allergic/Immunologic: Negative for immunocompromised state.  Neurological: Positive for dizziness, weakness (generalized), light-headedness and headaches ("swimmy headed"). Negative for tremors and syncope.  Psychiatric/Behavioral: Negative for confusion.   Physical Exam Updated Vital Signs BP (!) 132/51 (BP Location: Right Arm)   Pulse 72   Temp 98 F (36.7 C)   Resp  16   Ht 5\' 4"  (1.626 m)   Wt 71.7 kg   SpO2 95%   BMI 27.12 kg/m   Physical Exam Vitals signs and nursing note reviewed.   Constitutional:      General: She is not in acute distress. HENT:     Head: Normocephalic.  Eyes:     Extraocular Movements: Extraocular movements intact.     Right eye: Nystagmus present.     Left eye: Nystagmus present.     Conjunctiva/sclera: Conjunctivae normal.     Pupils: Pupils are equal, round, and reactive to light.  Neck:     Musculoskeletal: Normal range of motion and neck supple.     Vascular: No JVD.  Cardiovascular:     Rate and Rhythm: Normal rate and regular rhythm.     Pulses:          Radial pulses are 2+ on the right side and 2+ on the left side.       Dorsalis pedis pulses are 2+ on the right side and 2+ on the left side.     Heart sounds: Heart sounds not distant. No murmur. No friction rub. No gallop. No S3 or S4 sounds.   Pulmonary:     Effort: Pulmonary effort is normal. No tachypnea or respiratory distress.     Breath sounds: No wheezing, rhonchi or rales.  Abdominal:     General: There is no distension.     Palpations: Abdomen is soft.  Musculoskeletal:     Right lower leg: No edema.     Left lower leg: No edema.  Skin:    General: Skin is warm.     Capillary Refill: Capillary refill takes less than 2 seconds.     Coloration: Skin is not cyanotic or pale.     Findings: No rash.  Neurological:     Mental Status: She is alert.     Comments: 7-8 beats of clonus on the left.  2-3 on the right.  Dysmetria with finger-to-nose on the left.  Right side is normal.  Alert and oriented x4.  Moves all 4 extremities.  Sensation is intact and equal throughout.  Cranial nerves II through XII are grossly intact.  5-5 strength against resistance of the bilateral upper and lower extremities.  Gait is deferred at this time.  Psychiatric:        Behavior: Behavior normal.      ED Treatments / Results  Labs (all labs ordered are listed, but only abnormal results are displayed) Labs Reviewed  COMPREHENSIVE METABOLIC PANEL - Abnormal; Notable for the following  components:      Result Value   Glucose, Bld 109 (*)    All other components within normal limits  CBC  I-STAT TROPONIN, ED  I-STAT TROPONIN, ED  I-STAT TROPONIN, ED    EKG EKG Interpretation  Date/Time:  Wednesday December 08 2018 21:07:06 EST Ventricular Rate:  72 PR Interval:  190 QRS Duration: 88 QT Interval:  400 QTC Calculation: 438 R Axis:   12 Text Interpretation:  Normal sinus rhythm Nonspecific ST and T wave abnormality Abnormal ECG No significant change since last tracing Confirmed by Wandra Arthurs [phone]) on 12/09/2018 12:11:40 AM   Radiology No results found.  Procedures Procedures (including critical care time)  Medications Ordered in ED Medications  iopamidol (ISOVUE-370) 76 % injection 125 mL (125 mLs Intravenous Contrast Given 12/08/18 1230)  sodium chloride 0.9 % bolus 1,000 mL (0 mLs Intravenous Stopped 12/08/18 1603)  Initial Impression / Assessment and Plan / ED Course  I have reviewed the triage vital signs and the nursing notes.  Pertinent labs & imaging results that were available during my care of the patient were reviewed by me and considered in my medical decision making (see chart for details).  Clinical Course as of Dec 11 2207  Wed Dec 08, 2018  1626 Spoke with Dr. Rory Percy, neurologist. Reviewed plan for this patient. States if MRI is unremarkable, she can be discharged home.   [SJ]    Clinical Course User Index [SJ] Joy, Shawn C, PA-C    73 year old female with a history of pulmonary hypertension, CKD, PVCs, GERD, HLD, anxiety, and C. difficile who presents to the emergency department who presents to the emergency department with a constellation of chest pain, dizziness, lightheadedness, diaphoresis, shortness of breath, and generalized weakness.  Although she denies headache, states she felt "swimmy headed" during the episode and that is the only symptom that has persisted since all of her other symptoms resolved.  She has had 2 episodes of  symptoms since 3:45 AM.  She is normotensive and without tachycardia.  No hypoxia or tachypnea.  She is afebrile.  Physical exam is notable for nystagmus, dysmetria on the left and 7-8 beats of clonus on the left compared to 2-3 on the right.  Cardiovascular exam is benign.   EKG with slightly more ST segment depression in leads II, V4, and V5 since April 2019.  Initial troponin is negative.  Labs are otherwise reassuring.  Chest x-ray is unremarkable.  The patient was seen and independently evaluated by Dr. Dayna Barker, attending physician.  Initial suspicion was anginal chest pain, but this does not explain the patient's focal neurologic findings on exam.  She is LVO negative.  Given both cardiac and neurologic symptoms, there is concern for dissection.  Since she was complaining of headache-like symptoms, consulted neurology and spoke with Dr. Rory Percy regarding imaging orders.  He recommended CTA head and neck in addition to CTA chest with dissection protocol as well as an MRI brain without.  Main normotensive since arrival in the ED except for one documented blood pressure in the high 90s over 60s so she was given an IV fluid bolus.  No dissection seen on CTA studies.  The patient has continued to remain chest pain-free since arrival in the ER.  Delta troponin is negative.  MRI brain is pending.  Dr. Dayna Barker recommended if patient was here for third troponin and it was negative that admission for chest pain rule out may not be indicated.   Patient care transferred to PA Joy at the end of my shift. Patient presentation, ED course, and plan of care discussed with review of all pertinent labs and imaging. Please see his/her note for further details regarding further ED course and disposition.  Final Clinical Impressions(s) / ED Diagnoses   Final diagnoses:  Chest pain, unspecified type    ED Discharge Orders    None       Livy Ross A, PA-C 12/08/18 [place], Andie Mungin A, PA-C 12/10/18  2209    Mesner, Corene Cornea, MD 12/13/18 [phone]

## 2018-12-08 NOTE — ED Provider Notes (Signed)
Mary Gordon is a 73 y.o. female, presenting to the ED with chest pain.  HPI from Joline Maxcy, PA-C: "Mary Gordon is a 73 y.o. female with a history of pulmonary hypertension, CKD, PVCs, GERD, HLD, anxiety, and C. difficile who presents to the emergency department with a chief complaint of chest pain.  The patient reports episodes of nonradiating, central, "pressure-like" chest pain accompanied by dizziness, lightheadedness, diaphoresis, feeling weak, shortness of breath, nausea, and "swimmy headed".  She reports the first episode began at approximately 3:45 AM.  She states "it felt like an elephant was sitting on my chest."  States the episode lasted for approximately 30 minutes before it subsided.  States that she has a history of GERD, but her symptoms have never felt similar, but took an antacid without improvement.  She reports that she was able to fall back asleep for some time.  She reports that she awoke around 8:30 AM and initially felt symptomatic.  States that she got up and started walking to the restroom and the constellation of symptoms returned so she called 911.  She was given 324 mg of aspirin in route by EMS.  She denies fever, chills, weakness, numbness, facial droop, slurred speech, palpitations, orthopnea, lower extremity swelling, vomiting, abdominal pain, back pain, cough, URI symptoms, congestion, neck pain, or left arm pain.  Upon arrival to the ED, she continues to endorse that "her head doesn't feel quite right. Not a headache. Just not right."  All other symptoms have since resolved.  Per chart review, she had ETT-Cardiolite done in 11/15 for the chest pressure. This showed no ischemia or infarction. Echo done in 11/15 also showed normal EF but PA systolic pressure was elevated at 49 mmHg. The RV appeared normal. She then had PFTs done in 12/15 that were normal. She saw Dr. Gwenette Greet and is not thought to have COPD or asthma. She had a CTA chest in 11/15  showing no acute findings, no PE. Sleep study showed no OSA. Given the palpitations, she had holter in 10/15, showing frequent PVCs (12.5% total beats). She was started on Toprol XL with almost complete resolution of palpitations.  Right heart cath in April 2016 did not demonstrate pulmonary hypertension or low filling pressures. No history of left heart cath."   Past Medical History:  Diagnosis Date  . Allergy    all year  . Anxiety   . Bursitis of hip   . Cataract   . Cervical cancer ([place]) 1978  . Chronic kidney disease    RENAL INSUFF  . Depression   . Dysrhythmia    PVC'S  . Fibromyalgia   . GERD (gastroesophageal reflux disease)   . High cholesterol   . History of Clostridium difficile infection    In past from augmentin  . History of hiatal hernia   . Osteopenia   . Palpitations   . Panic disorder   . Pulmonary HTN ([place]) 10/20/2014   moderate with PASP 49mmHg  . Pulmonary nodule    Being observed  . Vitamin D deficiency     Physical Exam  BP 125/70   Pulse 65   Temp 98.5 F (36.9 C)   Resp 17   Ht 5\' 4"  (1.626 m)   Wt 71.7 kg   SpO2 100%   BMI 27.12 kg/m   Physical Exam Vitals signs and nursing note reviewed.  Constitutional:      General: She is not in acute distress.    Appearance: She is  well-developed. She is not diaphoretic.  HENT:     Head: Normocephalic and atraumatic.  Eyes:     Conjunctiva/sclera: Conjunctivae normal.  Neck:     Musculoskeletal: Neck supple.  Cardiovascular:     Rate and Rhythm: Normal rate and regular rhythm.     Pulses:          Radial pulses are 2+ on the right side and 2+ on the left side.       Posterior tibial pulses are 2+ on the right side and 2+ on the left side.     Heart sounds: Normal heart sounds.  Pulmonary:     Effort: Pulmonary effort is normal. No respiratory distress.     Breath sounds: Normal breath sounds.  Abdominal:     Palpations: Abdomen is soft.     Tenderness: There is no abdominal  tenderness. There is no guarding.  Lymphadenopathy:     Cervical: No cervical adenopathy.  Skin:    General: Skin is warm and dry.  Neurological:     Mental Status: She is alert.     Comments: Sensation grossly intact to light touch in the extremities. No noted speech deficits. No aphasia. Patient handles oral secretions without difficulty. No noted swallowing defects.  Equal grip strength bilaterally. Strength 5/5 in the upper extremities. Strength 5/5 with flexion and extension of the hips, knees, and ankles bilaterally.  No gait disturbance.  Coordination intact including heel to shin and finger to nose.  Cranial nerves III-XII grossly intact.  No facial droop.  No clonus.  Psychiatric:        Behavior: Behavior normal.     ED Course/Procedures    Procedures  Abnormal Labs Reviewed  COMPREHENSIVE METABOLIC PANEL - Abnormal; Notable for the following components:      Result Value   Glucose, Bld 109 (*)    All other components within normal limits    Ct Angio Head W Or Wo Contrast  Result Date: 12/08/2018 CLINICAL DATA:  Chest pain since this morning with dizziness EXAM: CT ANGIOGRAPHY HEAD AND NECK TECHNIQUE: Multidetector CT imaging of the head and neck was performed using the standard protocol during bolus administration of intravenous contrast. Multiplanar CT image reconstructions and MIPs were obtained to evaluate the vascular anatomy. Carotid stenosis measurements (when applicable) are obtained utilizing NASCET criteria, using the distal internal carotid diameter as the denominator. CONTRAST:  113mL ISOVUE-370 IOPAMIDOL (ISOVUE-370) INJECTION 76% COMPARISON:  Head CT from earlier this morning FINDINGS: CTA NECK FINDINGS Aortic arch: Unremarkable where covered.  Three vessel branching. Right carotid system: Vessels are smooth and widely patent with no noted atheromatous changes. Left carotid system: Vessels are smooth and widely patent with no noted atheromatous changes.  Vertebral arteries: No proximal subclavian atherosclerosis or stenosis. Mild left vertebral artery dominance. Both vertebral arteries are smooth and widely patent to the dura Skeleton: Unremarkable. Other neck: Unremarkable. Upper chest: Reported separately Review of the MIP images confirms the above findings CTA HEAD FINDINGS Anterior circulation: Normal Posterior circulation:  Normal Venous sinuses: Diffusely patent Anatomic variants: None significant Delayed phase: No abnormal intracranial enhancement Review of the MIP images confirms the above findings IMPRESSION: Normal CTA of the head and neck. Electronically Signed   By: Monte Fantasia M.D.   On: 12/08/2018 13:27   Dg Chest 2 View  Result Date: 12/08/2018 CLINICAL DATA:  Chest pain.  Shortness of breath. EXAM: CHEST - 2 VIEW COMPARISON:  CT scans of the chest dated 09/11/2015 and 10/12/2014 FINDINGS:  Heart size and vascularity are normal and the lungs are clear. No effusions. No acute bone abnormality. Chronic anterior wedge deformities in 2 of the midthoracic vertebral bodies, probably developmental. This is unchanged since the study of 2015. IMPRESSION: No active cardiopulmonary disease. Electronically Signed   By: Lorriane Shire M.D.   On: 12/08/2018 11:18   Ct Head Wo Contrast  Result Date: 12/08/2018 CLINICAL DATA:  73 year old female with history of neurologic deficit. Dizziness. EXAM: CT HEAD WITHOUT CONTRAST TECHNIQUE: Contiguous axial images were obtained from the base of the skull through the vertex without intravenous contrast. COMPARISON:  None. FINDINGS: Brain: No evidence of acute infarction, hemorrhage, hydrocephalus, extra-axial collection or mass lesion/mass effect. Vascular: No hyperdense vessel or unexpected calcification. Skull: Normal. Negative for fracture or focal lesion. Sinuses/Orbits: No acute finding. Tiny mucosal retention cyst or polyp in the medial aspect of the right maxillary sinus. Other: None. IMPRESSION: 1. No acute  intracranial abnormalities. 2. The appearance of the brain is normal. Electronically Signed   By: Vinnie Langton M.D.   On: 12/08/2018 11:17   Ct Angio Neck W And/or Wo Contrast  Result Date: 12/08/2018 CLINICAL DATA:  Chest pain since this morning with dizziness EXAM: CT ANGIOGRAPHY HEAD AND NECK TECHNIQUE: Multidetector CT imaging of the head and neck was performed using the standard protocol during bolus administration of intravenous contrast. Multiplanar CT image reconstructions and MIPs were obtained to evaluate the vascular anatomy. Carotid stenosis measurements (when applicable) are obtained utilizing NASCET criteria, using the distal internal carotid diameter as the denominator. CONTRAST:  175mL ISOVUE-370 IOPAMIDOL (ISOVUE-370) INJECTION 76% COMPARISON:  Head CT from earlier this morning FINDINGS: CTA NECK FINDINGS Aortic arch: Unremarkable where covered.  Three vessel branching. Right carotid system: Vessels are smooth and widely patent with no noted atheromatous changes. Left carotid system: Vessels are smooth and widely patent with no noted atheromatous changes. Vertebral arteries: No proximal subclavian atherosclerosis or stenosis. Mild left vertebral artery dominance. Both vertebral arteries are smooth and widely patent to the dura Skeleton: Unremarkable. Other neck: Unremarkable. Upper chest: Reported separately Review of the MIP images confirms the above findings CTA HEAD FINDINGS Anterior circulation: Normal Posterior circulation:  Normal Venous sinuses: Diffusely patent Anatomic variants: None significant Delayed phase: No abnormal intracranial enhancement Review of the MIP images confirms the above findings IMPRESSION: Normal CTA of the head and neck. Electronically Signed   By: Monte Fantasia M.D.   On: 12/08/2018 13:27   Mr Brain Wo Contrast  Result Date: 12/08/2018 CLINICAL DATA:  Left arm and leg changes with headache and chest pain EXAM: MRI HEAD WITHOUT CONTRAST TECHNIQUE:  Multiplanar, multiecho pulse sequences of the brain and surrounding structures were obtained without intravenous contrast. COMPARISON:  None. FINDINGS: Brain: No acute infarction, hemorrhage, hydrocephalus, extra-axial collection or mass lesion. There are rare FLAIR hyperintensities in the cerebral white matter, age congruent. Vascular: Normal flow voids. Skull and upper cervical spine: Normal marrow signal. Sinuses/Orbits: No acute finding.  Left concha bullosa. IMPRESSION: Unremarkable brain MRI. Electronically Signed   By: Monte Fantasia M.D.   On: 12/08/2018 19:51   Ct Angio Chest Aorta W And/or Wo Contrast  Result Date: 12/08/2018 CLINICAL DATA:  Chest pain, dizziness EXAM: CT ANGIOGRAPHY CHEST WITH CONTRAST TECHNIQUE: Multidetector CT imaging of the chest was performed using the standard protocol during bolus administration of intravenous contrast. Multiplanar CT image reconstructions and MIPs were obtained to evaluate the vascular anatomy. CONTRAST:  158mL ISOVUE-370 IOPAMIDOL (ISOVUE-370) INJECTION 76% COMPARISON:  09/11/2015 FINDINGS:  Cardiovascular: Mild cardiomegaly. Satisfactory opacification of pulmonary arteries noted, and there is no evidence of pulmonary emboli. Adequate contrast opacification of the thoracic aorta with no evidence of dissection, aneurysm, or stenosis. There is classic 3-vessel brachiocephalic arch anatomy without proximal stenosis. Minimal atheromatous plaque in the visualized proximal abdominal aorta. Mediastinum/Nodes: No enlarged mediastinal, hilar, or axillary lymph nodes. Thyroid gland, trachea, and esophagus demonstrate no significant findings. Lungs/Pleura: No pleural effusion. No pneumothorax. Mild dependent atelectasis posteriorly in the lower lobes. Changes of pulmonary emphysema. Symmetric scarring in the lung apices. No focal infiltrate or nodule. Upper Abdomen: No acute findings. Musculoskeletal: Anterior vertebral endplate spurring at multiple levels in the mid  thoracic spine. No fracture or worrisome bone lesion. Review of the MIP images confirms the above findings. IMPRESSION: 1. Negative for acute PE or thoracic aortic dissection. 2. Pulmonary emphysema (ICD10-J43.9). Electronically Signed   By: Lucrezia Europe M.D.   On: 12/08/2018 13:34    EKG Interpretation  Date/Time:  Wednesday December 08 2018 10:15:20 EST Ventricular Rate:  58 PR Interval:    QRS Duration: 100 QT Interval:  430 QTC Calculation: 423 R Axis:   6 Text Interpretation:  Sinus rhythm Nonspecific T abnrm, anterolateral leads ST depression in II, V4, V5 worsened/new since april 2019 Confirmed by Merrily Pew [id number] on 12/08/2018 10:22:23 AM        EKG Interpretation  Date/Time:  Wednesday December 08 2018 21:07:06 EST Ventricular Rate:  72 PR Interval:  190 QRS Duration: 88 QT Interval:  400 QTC Calculation: 438 R Axis:   12 Text Interpretation:  Normal sinus rhythm Nonspecific ST and T wave abnormality Abnormal ECG No significant change since last tracing Confirmed by Wandra Arthurs [id number]) on 12/09/2018 12:11:40 AM       MDM   Clinical Course as of Dec 10 19  Wed Dec 08, 2018  1626 Spoke with Dr. Rory Percy, neurologist. Reviewed plan for this patient. States if MRI is unremarkable, she can be discharged home.   [SJ]    Clinical Course User Index [SJ] , Helane Gunther, PA-C     Took patient care handoff report from [employer], [place]. Plan: MRI brain pending.  Patient will also need third troponin.  Patient presented after having episodes of chest pain prior to arrival. Previous treatment team had suspicion for possible dissection.  CTAs of the chest, neck, and head were without acute abnormality.  EKG stable throughout ED course.  Three troponins negative. There was also noted left-sided neurologic abnormalities on previous PA's exam. MRI was unremarkable.  There were no neurologic abnormalities noted on my examinations of the patient.  In fact, patient remained  symptom-free while under my care. Ambulated without difficulty, assistance, or onset of symptoms. Follow-up and return precautions discussed. Patient voices understanding of these instructions, accepts the plan, and is comfortable with discharge.    Vitals:   12/08/18 1013 12/08/18 1014 12/08/18 1015 12/08/18 1116  BP: 124/67 124/67 125/70   Pulse: 65 (!) 58 61 65  Resp:  10 17   Temp:  98.5 F (36.9 C)    SpO2: 100% 99% 97% 100%  Weight:      Height:           Lorayne Bender, PA-C 12/09/18 0026    Drenda Freeze, MD 12/09/18 [phone]

## 2018-12-08 NOTE — ED Notes (Signed)
Patient ambulated past the nurses station and back with steady gait and no complaints of dizziness.

## 2018-12-08 NOTE — ED Notes (Signed)
Patient transported to MRI 

## 2018-12-08 NOTE — Discharge Instructions (Addendum)
Lab work and imaging studies were reassuring today. Please follow-up with your cardiologist as soon as possible on this matter. Return to the hospital for chest pain, shortness of breath, dizziness, passing out, or any other major concerns.

## 2018-12-08 NOTE — ED Notes (Signed)
Patient transported to CT 

## 2018-12-08 NOTE — ED Triage Notes (Addendum)
Pt here from home for c.o. chest pain that woke her up around 345. She was diaphoretic with centralized chest pain. Went back to sleep, when she woke up and was walking around she began having centralized chest pain again with diaphoresis. Given 324 Asprin by EMS. Hx of acid reflux but pt states this felt different. No pain at present, no nitro given. Pt also reports she gets dizzy and swimmy headed when the pain is present.

## 2018-12-14 ENCOUNTER — Encounter: Payer: Self-pay | Admitting: Family Medicine

## 2018-12-14 ENCOUNTER — Ambulatory Visit (INDEPENDENT_AMBULATORY_CARE_PROVIDER_SITE_OTHER): Payer: PPO | Admitting: Family Medicine

## 2018-12-14 VITALS — BP 126/68 | HR 73 | Temp 98.2°F | Ht 64.0 in | Wt 158.5 lb

## 2018-12-14 DIAGNOSIS — I272 Pulmonary hypertension, unspecified: Secondary | ICD-10-CM | POA: Diagnosis not present

## 2018-12-14 DIAGNOSIS — F4323 Adjustment disorder with mixed anxiety and depressed mood: Secondary | ICD-10-CM

## 2018-12-14 DIAGNOSIS — F430 Acute stress reaction: Secondary | ICD-10-CM | POA: Diagnosis not present

## 2018-12-14 MED ORDER — SERTRALINE HCL 100 MG PO TABS: 100.0000 mg | ORAL_TABLET | Freq: Every day | ORAL | 3 refills | Status: DC

## 2018-12-14 NOTE — Assessment & Plan Note (Signed)
Caregiver stress for pt caring for husband with dementia who denies he has a problem Safe/no abuse Recent panic attack- Reviewed hospital records, lab results and studies in detail   Reviewed stressors/ coping techniques/symptoms/ support sources/ tx options and side effects in detail today  Made plan to get into a support group Also read 36 hour day (re dementia care) and work with husb doctor to get treated  >25 minutes spent in face to face time with patient, >50% spent in counselling or coordination of care -incl the importance of self care at this time

## 2018-12-14 NOTE — Progress Notes (Signed)
Subjective:    Patient ID: Mary Gordon, female    DOB: 02-Aug-1946, 73 y.o.   MRN: [id number]  HPI  Here for ED f/u of suspected panic attack   She was seen 12/08/18 at [facility]  She presented with CP with dizzy/light headed feeling, general weakness, sob, nausea  Antacid at home did no help Symptoms worsened and she called 911 (given asa in route) Cardiac hx was rev including ETT, echo, PFTs, CTA and holter done in 2015 R heart cath in April -no abn either   She had nl exam in the ED EKG stable 3 troponins neg   Pt states she felt a lot better by the time she got to the hospital   Imaging done Ct Angio Head W Or Wo Contrast  Result Date: 12/08/2018 CLINICAL DATA:  Chest pain since this morning with dizziness EXAM: CT ANGIOGRAPHY HEAD AND NECK TECHNIQUE: Multidetector CT imaging of the head and neck was performed using the standard protocol during bolus administration of intravenous contrast. Multiplanar CT image reconstructions and MIPs were obtained to evaluate the vascular anatomy. Carotid stenosis measurements (when applicable) are obtained utilizing NASCET criteria, using the distal internal carotid diameter as the denominator. CONTRAST:  170mL ISOVUE-370 IOPAMIDOL (ISOVUE-370) INJECTION 76% COMPARISON:  Head CT from earlier this morning FINDINGS: CTA NECK FINDINGS Aortic arch: Unremarkable where covered.  Three vessel branching. Right carotid system: Vessels are smooth and widely patent with no noted atheromatous changes. Left carotid system: Vessels are smooth and widely patent with no noted atheromatous changes. Vertebral arteries: No proximal subclavian atherosclerosis or stenosis. Mild left vertebral artery dominance. Both vertebral arteries are smooth and widely patent to the dura Skeleton: Unremarkable. Other neck: Unremarkable. Upper chest: Reported separately Review of the MIP images confirms the above findings CTA HEAD FINDINGS Anterior circulation: Normal Posterior  circulation:  Normal Venous sinuses: Diffusely patent Anatomic variants: None significant Delayed phase: No abnormal intracranial enhancement Review of the MIP images confirms the above findings IMPRESSION: Normal CTA of the head and neck. Electronically Signed   By: Monte Fantasia M.D.   On: 12/08/2018 13:27   Dg Chest 2 View  Result Date: 12/08/2018 CLINICAL DATA:  Chest pain.  Shortness of breath. EXAM: CHEST - 2 VIEW COMPARISON:  CT scans of the chest dated 09/11/2015 and 10/12/2014 FINDINGS: Heart size and vascularity are normal and the lungs are clear. No effusions. No acute bone abnormality. Chronic anterior wedge deformities in 2 of the midthoracic vertebral bodies, probably developmental. This is unchanged since the study of 2015. IMPRESSION: No active cardiopulmonary disease. Electronically Signed   By: Lorriane Shire M.D.   On: 12/08/2018 11:18   Ct Head Wo Contrast  Result Date: 12/08/2018 CLINICAL DATA:  73 year old female with history of neurologic deficit. Dizziness. EXAM: CT HEAD WITHOUT CONTRAST TECHNIQUE: Contiguous axial images were obtained from the base of the skull through the vertex without intravenous contrast. COMPARISON:  None. FINDINGS: Brain: No evidence of acute infarction, hemorrhage, hydrocephalus, extra-axial collection or mass lesion/mass effect. Vascular: No hyperdense vessel or unexpected calcification. Skull: Normal. Negative for fracture or focal lesion. Sinuses/Orbits: No acute finding. Tiny mucosal retention cyst or polyp in the medial aspect of the right maxillary sinus. Other: None. IMPRESSION: 1. No acute intracranial abnormalities. 2. The appearance of the brain is normal. Electronically Signed   By: Vinnie Langton M.D.   On: 12/08/2018 11:17   Ct Angio Neck W And/or Wo Contrast  Result Date: 12/08/2018 CLINICAL DATA:  Chest  pain since this morning with dizziness EXAM: CT ANGIOGRAPHY HEAD AND NECK TECHNIQUE: Multidetector CT imaging of the head and neck was  performed using the standard protocol during bolus administration of intravenous contrast. Multiplanar CT image reconstructions and MIPs were obtained to evaluate the vascular anatomy. Carotid stenosis measurements (when applicable) are obtained utilizing NASCET criteria, using the distal internal carotid diameter as the denominator. CONTRAST:  116mL ISOVUE-370 IOPAMIDOL (ISOVUE-370) INJECTION 76% COMPARISON:  Head CT from earlier this morning FINDINGS: CTA NECK FINDINGS Aortic arch: Unremarkable where covered.  Three vessel branching. Right carotid system: Vessels are smooth and widely patent with no noted atheromatous changes. Left carotid system: Vessels are smooth and widely patent with no noted atheromatous changes. Vertebral arteries: No proximal subclavian atherosclerosis or stenosis. Mild left vertebral artery dominance. Both vertebral arteries are smooth and widely patent to the dura Skeleton: Unremarkable. Other neck: Unremarkable. Upper chest: Reported separately Review of the MIP images confirms the above findings CTA HEAD FINDINGS Anterior circulation: Normal Posterior circulation:  Normal Venous sinuses: Diffusely patent Anatomic variants: None significant Delayed phase: No abnormal intracranial enhancement Review of the MIP images confirms the above findings IMPRESSION: Normal CTA of the head and neck. Electronically Signed   By: Monte Fantasia M.D.   On: 12/08/2018 13:27   Mr Brain Wo Contrast  Result Date: 12/08/2018 CLINICAL DATA:  Left arm and leg changes with headache and chest pain EXAM: MRI HEAD WITHOUT CONTRAST TECHNIQUE: Multiplanar, multiecho pulse sequences of the brain and surrounding structures were obtained without intravenous contrast. COMPARISON:  None. FINDINGS: Brain: No acute infarction, hemorrhage, hydrocephalus, extra-axial collection or mass lesion. There are rare FLAIR hyperintensities in the cerebral white matter, age congruent. Vascular: Normal flow voids. Skull and upper  cervical spine: Normal marrow signal. Sinuses/Orbits: No acute finding.  Left concha bullosa. IMPRESSION: Unremarkable brain MRI. Electronically Signed   By: Monte Fantasia M.D.   On: 12/08/2018 19:51   Ct Angio Chest Aorta W And/or Wo Contrast  Result Date: 12/08/2018 CLINICAL DATA:  Chest pain, dizziness EXAM: CT ANGIOGRAPHY CHEST WITH CONTRAST TECHNIQUE: Multidetector CT imaging of the chest was performed using the standard protocol during bolus administration of intravenous contrast. Multiplanar CT image reconstructions and MIPs were obtained to evaluate the vascular anatomy. CONTRAST:  166mL ISOVUE-370 IOPAMIDOL (ISOVUE-370) INJECTION 76% COMPARISON:  09/11/2015 FINDINGS: Cardiovascular: Mild cardiomegaly. Satisfactory opacification of pulmonary arteries noted, and there is no evidence of pulmonary emboli. Adequate contrast opacification of the thoracic aorta with no evidence of dissection, aneurysm, or stenosis. There is classic 3-vessel brachiocephalic arch anatomy without proximal stenosis. Minimal atheromatous plaque in the visualized proximal abdominal aorta. Mediastinum/Nodes: No enlarged mediastinal, hilar, or axillary lymph nodes. Thyroid gland, trachea, and esophagus demonstrate no significant findings. Lungs/Pleura: No pleural effusion. No pneumothorax. Mild dependent atelectasis posteriorly in the lower lobes. Changes of pulmonary emphysema. Symmetric scarring in the lung apices. No focal infiltrate or nodule. Upper Abdomen: No acute findings. Musculoskeletal: Anterior vertebral endplate spurring at multiple levels in the mid thoracic spine. No fracture or worrisome bone lesion. Review of the MIP images confirms the above findings. IMPRESSION: 1. Negative for acute PE or thoracic aortic dissection. 2. Pulmonary emphysema (ICD10-J43.9). Electronically Signed   By: Lucrezia Europe M.D.   On: 12/08/2018 13:34    No evid of PE or dissection  MRI of brain reassuring and neuro consulted   Labs Lab  Results  Component Value Date   CREATININE 0.89 12/08/2018   BUN 8 12/08/2018   NA 140  12/08/2018   K 3.9 12/08/2018   CL 106 12/08/2018   CO2 23 12/08/2018   Lab Results  Component Value Date   ALT 14 12/08/2018   AST 22 12/08/2018   ALKPHOS 72 12/08/2018   BILITOT 1.0 12/08/2018    Glucose 109 non fasting  Lab Results  Component Value Date   CKTOTAL 49 10/15/2012   troponins neg Lab Results  Component Value Date   WBC 5.9 12/08/2018   HGB 13.7 12/08/2018   HCT 44.2 12/08/2018   MCV 89.8 12/08/2018   PLT 275 12/08/2018      She takes zoloft 50 mg daily  Also trazodone 100 mg for sleep   Wt Readings from Last 3 Encounters:  12/14/18 158 lb 8 oz (71.9 kg)  12/08/18 158 lb (71.7 kg)  08/27/18 159 lb 8 oz (72.3 kg)   27.21 kg/m   Blood pressure  BP Readings from Last 3 Encounters:  12/14/18 126/68  12/08/18 (!) 132/51  08/27/18 118/64   Pulse Readings from Last 3 Encounters:  12/14/18 73  12/08/18 72  08/27/18 65    Thinks she had a panic attack  Notes that all day the day before she had felt agitated Was upset somewhat with her husband  No hx of abuse She feels safe   She thinks husband has early dementia  Also father's wife has alz  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 Dysuria 06/09/2018  . Sprain of ankle 06/09/2018  . Laryngopharyngeal reflux (LPR) 01/08/2017  . Pharyngeal dysphagia 01/08/2017  . Prediabetes 08/25/2016  . Routine general medical examination at a health care facility 08/19/2016  . Stress reaction 09/17/2015  . 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 Rosacea 12/27/2014  . Pulmonary HTN ([place]) 10/20/2014  . DOE (dyspnea on exertion) 09/26/2014  . PVCs (premature ventricular contractions) 09/26/2014  . Encounter for Medicare annual wellness exam 03/18/2014  . Fibromyalgia 05/31/2012    . Special screening for malignant neoplasms, colon 09/26/2011  . GERD (gastroesophageal reflux disease) 07/08/2011  . CERVICAL RADICULOPATHY, RIGHT 09/04/2010  . PULMONARY NODULE 09/14/2009  . PALPITATIONS 08/17/2009  . Vitamin D deficiency 02/22/2009  . HYPERCHOLESTEROLEMIA 02/22/2009  . Adjustment disorder with mixed anxiety and depressed mood 02/22/2009  . ALLERGIC RHINITIS 02/22/2009  . MENOPAUSAL SYNDROME 02/22/2009  . Osteopenia 02/22/2009   Past Medical History:  Diagnosis Date  . Allergy    all year  . Anxiety   . Bursitis of hip   . Cataract   . Cervical cancer ([place]) 1978  . Chronic kidney disease    RENAL INSUFF  . Depression   . Dysrhythmia    PVC'S  . Fibromyalgia   . GERD (gastroesophageal reflux disease)   . High cholesterol   . History of Clostridium difficile infection    In past from augmentin  . History of hiatal hernia   . Osteopenia   . Palpitations   . Panic disorder   . Pulmonary HTN ([place]) 10/20/2014   moderate with PASP 41mmHg  . Pulmonary nodule    Being observed  . Vitamin D deficiency    Past Surgical History:  Procedure Laterality Date  . ABDOMINAL HYSTERECTOMY  1978  . CARDIAC CATHETERIZATION    . CATARACT EXTRACTION W/PHACO Right 07/14/2017   Procedure: CATARACT EXTRACTION PHACO AND INTRAOCULAR LENS PLACEMENT (IOC);  Surgeon: Birder Robson, MD;  Location: [facility] ORS;  Service: Ophthalmology;  Laterality: Right;  [place] 00:31 AP% 15.7 CDE 4.97 Fluid pack lot # [id number]  . CATARACT EXTRACTION W/PHACO Left 08/04/2017   Procedure: CATARACT EXTRACTION PHACO AND INTRAOCULAR LENS PLACEMENT (IOC);  Surgeon: Birder Robson, MD;  Location: [facility] ORS;  Service: Ophthalmology;  Laterality: Left;  [place] 00:34 AP% 18.7 CDE 6.42 Fluid pack lot # [id number]  . COLONOSCOPY  2002   Normal  . HAND SURGERY  2005   Left hand thrombosis  . NASAL HEMORRHAGE CONTROL  2007  . RIGHT HEART CATHETERIZATION N/A 03/16/2015   Procedure: RIGHT HEART CATH;  Surgeon:  Larey Dresser, MD;  Location: [facility] CATH LAB;  Service: Cardiovascular;  Laterality: N/A;  . SHOULDER SURGERY  2008   Right shoulder bone spur  . THYROGLOSSAL DUCT CYST     Social History   Tobacco Use  . Smoking status: Former Smoker    Packs/day: 1.00    Years: 17.00    Pack years: 17.00    Types: Cigarettes    Last attempt to quit: 12/08/1978    Years since quitting: 40.0  . Smokeless tobacco: Never Used  Substance Use Topics  . Alcohol use: No    Alcohol/week: 0.0 standard drinks  . Drug use: No   Family History  Problem Relation Age of Onset  . Heart attack Mother 109  . Emphysema Mother   . Allergies Mother   . Asthma Mother   . Allergies Father   . Glaucoma Father   . Arrhythmia Sister        Atrial fibrillation  . Cancer Sister        breast  . Breast cancer Sister   . Emphysema Sister   . Heart disease Sister   . Rheumatologic disease Sister   . Lung cancer Sister   . Diabetes Other        Parent  . Hyperlipidemia Other        Other relative  . Hypertension Other        Parent, other relative  . Breast cancer Other        Other relative, sister  . Allergies Other        Siblings  . Asthma Other        Sisters  . Colon cancer Neg Hx   . Esophageal cancer Neg Hx   . Rectal cancer Neg Hx   . Stomach cancer Neg Hx    Allergies  Allergen Reactions  . Adhesive [Tape] Rash and Other (See Comments)    "Burns" the skin  . Amoxicillin-Pot Clavulanate Other (See Comments)    Resulted in C-DIFF!!!!  . Simvastatin Other (See Comments)    Caused severe muscle pain   Current Outpatient Medications on File Prior to Visit  Medication Sig Dispense Refill  . chlorpheniramine (CHLOR-TRIMETON) 4 MG tablet Take 8 mg by mouth 2 (two) times daily.     . Cholecalciferol (VITAMIN D3) 10000 units capsule Take 10,000 Units by mouth daily.    Marland Kitchen estradiol (ESTRACE) 1 MG tablet TAKE 1 TABLET EVERY DAY (Patient taking differently: Take 1 mg by mouth daily. ) 90 tablet 2  .  isosorbide mononitrate (IMDUR) 60 MG 24 hr tablet TAKE 1 TABLET (60 MG TOTAL) BY MOUTH DAILY. (Patient taking differently: Take 60 mg by mouth daily. ) 90 tablet 3  . Krill Oil 500 MG CAPS Take 500 mg by mouth daily.    . metoprolol succinate (TOPROL-XL)  25 MG 24 hr tablet TAKE 1 TABLET BY MOUTH EVERY DAY (Patient taking differently: Take 25 mg by mouth at bedtime. ) 90 tablet 1  . metroNIDAZOLE (METROCREAM) 0.75 % cream Apply topically 2 (two) times daily as needed. To affected facial areas for rosacea (Patient taking differently: Apply 1 application topically 2 (two) times daily as needed (to affected facial areas for rosacea). ) 45 g 3  . OVER THE COUNTER MEDICATION Take 5-7 mLs by mouth See admin instructions. Life Tones (uric acid cleanser- has celery extract and other herbs) liquid: Take 5-7 ml's by mouth once a day    . pantoprazole (PROTONIX) 40 MG tablet Take 1 tablet (40 mg total) by mouth daily. (Patient taking differently: Take 40 mg by mouth daily as needed (for reflux). ) 90 tablet 3  . traZODone (DESYREL) 100 MG tablet Take 1 tablet (100 mg total) by mouth at bedtime. 90 tablet 3   No current facility-administered medications on file prior to visit.     Review of Systems  Constitutional: Negative for activity change, appetite change, fatigue, fever and unexpected weight change.  HENT: Negative for congestion, ear pain, rhinorrhea, sinus pressure and sore throat.   Eyes: Negative for pain, redness and visual disturbance.  Respiratory: Negative for cough, shortness of breath and wheezing.   Cardiovascular: Negative for chest pain and palpitations.  Gastrointestinal: Negative for abdominal pain, blood in stool, constipation and diarrhea.  Endocrine: Negative for polydipsia and polyuria.  Genitourinary: Negative for dysuria, frequency and urgency.  Musculoskeletal: Negative for arthralgias, back pain and myalgias.  Skin: Negative for pallor and rash.  Allergic/Immunologic: Negative  for environmental allergies.  Neurological: Negative for dizziness, syncope and headaches.  Hematological: Negative for adenopathy. Does not bruise/bleed easily.  Psychiatric/Behavioral: Negative for decreased concentration, dysphoric mood, self-injury, sleep disturbance and suicidal ideas. The patient is nervous/anxious.        Stressors        Objective:   Physical Exam Constitutional:      General: She is not in acute distress.    Appearance: Normal appearance. She is well-developed and normal weight. She is not ill-appearing.  HENT:     Head: Normocephalic and atraumatic.     Mouth/Throat:     Mouth: Mucous membranes are moist.     Pharynx: Oropharynx is clear.  Eyes:     Conjunctiva/sclera: Conjunctivae normal.     Pupils: Pupils are equal, round, and reactive to light.  Neck:     Musculoskeletal: Normal range of motion and neck supple.     Thyroid: No thyromegaly.     Vascular: No carotid bruit or JVD.  Cardiovascular:     Rate and Rhythm: Normal rate and regular rhythm.     Pulses: Normal pulses.     Heart sounds: Normal heart sounds. No gallop.   Pulmonary:     Effort: Pulmonary effort is normal. No respiratory distress.     Breath sounds: Normal breath sounds. No wheezing or rales.  Abdominal:     General: Bowel sounds are normal. There is no distension or abdominal bruit.     Palpations: Abdomen is soft. There is no mass.     Tenderness: There is no abdominal tenderness.  Musculoskeletal:        General: No tenderness.     Right lower leg: No edema.     Left lower leg: No edema.  Lymphadenopathy:     Cervical: No cervical adenopathy.  Skin:    General: Skin is  warm and dry.     Capillary Refill: Capillary refill takes less than 2 seconds.     Coloration: Skin is not jaundiced or pale.     Findings: No erythema or rash.  Neurological:     Mental Status: She is alert. Mental status is at baseline.     Deep Tendon Reflexes: Reflexes are normal and symmetric.  Reflexes normal.     Comments: No tremor   Psychiatric:        Mood and Affect: Mood is anxious.        Cognition and Memory: Cognition and memory normal.     Comments: Mildly anxious  Voices great frustration over home stressors            Assessment & Plan:   Problem List Items Addressed This Visit      Other   Stress reaction - Primary    Caregiver stress for pt caring for husband with dementia who denies he has a problem Safe/no abuse Recent panic attack- Reviewed hospital records, lab results and studies in detail   Reviewed stressors/ coping techniques/symptoms/ support sources/ tx options and side effects in detail today  Made plan to get into a support group Also read 36 hour day (re dementia care) and work with husb doctor to get treated  >25 minutes spent in face to face time with patient, >50% spent in counselling or coordination of care -incl the importance of self care at this time       Relevant Medications   sertraline (ZOLOFT) 100 MG tablet   Adjustment disorder with mixed anxiety and depressed mood    Reviewed stressors/ coping techniques/symptoms/ support sources/ tx options and side effects in detail today Recent panic attack - Reviewed hospital records, lab results and studies in detail   Reassuring w/u Inc zoloft to 100 mg daily  Disc strategies for stressors and also recommended counseling

## 2018-12-14 NOTE — Patient Instructions (Signed)
Take care of yourself   Go back and read the 36 hour day Consider support group   Increase zoloft to 100 mg once daily   If symptoms return or worsen please let me know   ED work up is reassuring   Please keep me posted

## 2018-12-14 NOTE — Assessment & Plan Note (Signed)
Reviewed stressors/ coping techniques/symptoms/ support sources/ tx options and side effects in detail today Recent panic attack - Reviewed hospital records, lab results and studies in detail   Reassuring w/u Inc zoloft to 100 mg daily  Disc strategies for stressors and also recommended counseling

## 2019-05-12 ENCOUNTER — Other Ambulatory Visit: Payer: Self-pay | Admitting: Family Medicine

## 2019-05-12 DIAGNOSIS — Z1231 Encounter for screening mammogram for malignant neoplasm of breast: Secondary | ICD-10-CM

## 2019-05-13 ENCOUNTER — Other Ambulatory Visit: Payer: Self-pay

## 2019-05-13 ENCOUNTER — Encounter: Payer: Self-pay | Admitting: Family Medicine

## 2019-05-13 ENCOUNTER — Ambulatory Visit (INDEPENDENT_AMBULATORY_CARE_PROVIDER_SITE_OTHER): Payer: PPO | Admitting: Family Medicine

## 2019-05-13 VITALS — BP 110/68 | HR 80 | Temp 98.0°F | Wt 170.2 lb

## 2019-05-13 DIAGNOSIS — N644 Mastodynia: Secondary | ICD-10-CM

## 2019-05-13 MED ORDER — CEPHALEXIN 500 MG PO CAPS: 500.0000 mg | ORAL_CAPSULE | Freq: Three times a day (TID) | ORAL | 0 refills | Status: DC

## 2019-05-13 NOTE — Assessment & Plan Note (Signed)
Suspect mild mastitis which she has had before  Tenderness on exam inferior to the nipple with slight warmth but no redness or lumps  She has dense/ropey breast tissue to start Will tx with keflex for possible bact infection  inst to use warm compress She will update [place] in a week- if symptoms are gone can proceed with screen mm/ if not consider diag mm and [place]

## 2019-05-13 NOTE — Progress Notes (Signed)
Subjective:    Patient ID: Mary Gordon, female    DOB: Dec 02, 1946, 73 y.o.   MRN: [id number]  HPI Here with c/o breast discomfort   Last mammogram was 02/12/18 [facility] Neg with dense tissue   Had a sister with breast cancer - in her 60s  No one else    She had called to schedule her mammogram  She noted the breast discomfort L side -tender around the nipple  H/o mastitis -years ago  No redness , no streaking   It feels more full in that area   Has dense breasts   Some stress  Thinks her husband may have Asberger's  Patient Active Problem List   Diagnosis Date Noted  . Breast pain, left 05/13/2019  . Dysuria 06/09/2018  . Sprain of ankle 06/09/2018  . Laryngopharyngeal reflux (LPR) 01/08/2017  . Pharyngeal dysphagia 01/08/2017  . Prediabetes 08/25/2016  . Routine general medical examination at a health care facility 08/19/2016  . Stress reaction 09/17/2015  . 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 Rosacea 12/27/2014  . Pulmonary HTN ([place]) 10/20/2014  . DOE (dyspnea on exertion) 09/26/2014  . PVCs (premature ventricular contractions) 09/26/2014  . Encounter for Medicare annual wellness exam 03/18/2014  . Fibromyalgia 05/31/2012  . Special screening for malignant neoplasms, colon 09/26/2011  . GERD (gastroesophageal reflux disease) 07/08/2011  . CERVICAL RADICULOPATHY, RIGHT 09/04/2010  . PULMONARY NODULE 09/14/2009  . PALPITATIONS 08/17/2009  . Vitamin D deficiency 02/22/2009  . HYPERCHOLESTEROLEMIA 02/22/2009  . Adjustment disorder with mixed anxiety and depressed mood 02/22/2009  . ALLERGIC RHINITIS 02/22/2009  . MENOPAUSAL SYNDROME 02/22/2009  . Osteopenia 02/22/2009   Past Medical History:  Diagnosis Date  . Allergy    all year  . Anxiety   . Bursitis of hip   . Cataract   . Cervical cancer ([place]) 1978  . Chronic kidney disease    RENAL INSUFF  . Depression   . Dysrhythmia    PVC'S  . Fibromyalgia   . GERD  (gastroesophageal reflux disease)   . High cholesterol   . History of Clostridium difficile infection    In past from augmentin  . History of hiatal hernia   . Osteopenia   . Palpitations   . Panic disorder   . Pulmonary HTN ([place]) 10/20/2014   moderate with PASP 78mmHg  . Pulmonary nodule    Being observed  . Vitamin D deficiency    Past Surgical History:  Procedure Laterality Date  . ABDOMINAL HYSTERECTOMY  1978  . CARDIAC CATHETERIZATION    . CATARACT EXTRACTION W/PHACO Right 07/14/2017   Procedure: CATARACT EXTRACTION PHACO AND INTRAOCULAR LENS PLACEMENT (IOC);  Surgeon: Birder Robson, MD;  Location: [facility] ORS;  Service: Ophthalmology;  Laterality: Right;  [place] 00:31 AP% 15.7 CDE 4.97 Fluid pack lot # [id number]  . CATARACT EXTRACTION W/PHACO Left 08/04/2017   Procedure: CATARACT EXTRACTION PHACO AND INTRAOCULAR LENS PLACEMENT (IOC);  Surgeon: Birder Robson, MD;  Location: [facility] ORS;  Service: Ophthalmology;  Laterality: Left;  [place] 00:34 AP% 18.7 CDE 6.42 Fluid pack lot # [id number]  . COLONOSCOPY  2002   Normal  . HAND SURGERY  2005   Left hand thrombosis  . NASAL HEMORRHAGE CONTROL  2007  . RIGHT HEART CATHETERIZATION N/A 03/16/2015   Procedure: RIGHT HEART CATH;  Surgeon: Larey Dresser, MD;  [facility] C[facility] LAB;  Service: Cardiovascular;  Laterality: N/A;  . SHOULDER SURGERY  2008   Right shoulder bone  spur  . THYROGLOSSAL DUCT CYST     Social History   Tobacco Use  . Smoking status: Former Smoker    Packs/day: 1.00    Years: 17.00    Pack years: 17.00    Types: Cigarettes    Last attempt to quit: 12/08/1978    Years since quitting: 40.4  . Smokeless tobacco: Never Used  Substance Use Topics  . Alcohol use: No    Alcohol/week: 0.0 standard drinks  . Drug use: No   Family History  Problem Relation Age of Onset  . Heart attack Mother 20  . Emphysema Mother   . Allergies Mother   . Asthma Mother   . Allergies Father   . Glaucoma Father   . Arrhythmia  Sister        Atrial fibrillation  . Cancer Sister        breast  . Breast cancer Sister   . Emphysema Sister   . Heart disease Sister   . Rheumatologic disease Sister   . Lung cancer Sister   . Diabetes Other        Parent  . Hyperlipidemia Other        Other relative  . Hypertension Other        Parent, other relative  . Breast cancer Other        Other relative, sister  . Allergies Other        Siblings  . Asthma Other        Sisters  . Colon cancer Neg Hx   . Esophageal cancer Neg Hx   . Rectal cancer Neg Hx   . Stomach cancer Neg Hx    Allergies  Allergen Reactions  . Adhesive [Tape] Rash and Other (See Comments)    "Burns" the skin  . Amoxicillin-Pot Clavulanate Other (See Comments)    Resulted in C-DIFF!!!!  . Simvastatin Other (See Comments)    Caused severe muscle pain   Current Outpatient Medications on File Prior to Visit  Medication Sig Dispense Refill  . chlorpheniramine (CHLOR-TRIMETON) 4 MG tablet Take 8 mg by mouth 2 (two) times daily.     . Cholecalciferol (VITAMIN D3) 10000 units capsule Take 10,000 Units by mouth daily.    Marland Kitchen estradiol (ESTRACE) 1 MG tablet TAKE 1 TABLET EVERY DAY (Patient taking differently: Take 1 mg by mouth daily. ) 90 tablet 2  . isosorbide mononitrate (IMDUR) 60 MG 24 hr tablet TAKE 1 TABLET (60 MG TOTAL) BY MOUTH DAILY. (Patient taking differently: Take 60 mg by mouth daily. ) 90 tablet 3  . Krill Oil 500 MG CAPS Take 500 mg by mouth daily.    . metoprolol succinate (TOPROL-XL) 25 MG 24 hr tablet TAKE 1 TABLET BY MOUTH EVERY DAY (Patient taking differently: Take 25 mg by mouth at bedtime. ) 90 tablet 1  . metroNIDAZOLE (METROCREAM) 0.75 % cream Apply topically 2 (two) times daily as needed. To affected facial areas for rosacea (Patient taking differently: Apply 1 application topically 2 (two) times daily as needed (to affected facial areas for rosacea). ) 45 g 3  . OVER THE COUNTER MEDICATION Take 5-7 mLs by mouth See admin  instructions. Life Tones (uric acid cleanser- has celery extract and other herbs) liquid: Take 5-7 ml's by mouth once a day    . pantoprazole (PROTONIX) 40 MG tablet Take 1 tablet (40 mg total) by mouth daily. (Patient taking differently: Take 40 mg by mouth daily as needed (for reflux). ) 90 tablet  3  . sertraline (ZOLOFT) 50 MG tablet Take 50 mg by mouth daily.    . traZODone (DESYREL) 100 MG tablet Take 1 tablet (100 mg total) by mouth at bedtime. 90 tablet 3   No current facility-administered medications on file prior to visit.     Review of Systems  Constitutional: Negative for activity change, appetite change, fatigue, fever and unexpected weight change.  HENT: Negative for congestion, ear pain, rhinorrhea, sinus pressure and sore throat.   Eyes: Negative for pain, redness and visual disturbance.  Respiratory: Negative for cough, shortness of breath and wheezing.   Cardiovascular: Negative for chest pain and palpitations.  Gastrointestinal: Negative for abdominal pain, blood in stool, constipation and diarrhea.  Endocrine: Negative for polydipsia and polyuria.  Genitourinary: Negative for dysuria, frequency and urgency.       L focal breast tenderness  Musculoskeletal: Negative for arthralgias, back pain and myalgias.  Skin: Negative for pallor and rash.  Allergic/Immunologic: Negative for environmental allergies.  Neurological: Negative for dizziness, syncope and headaches.  Hematological: Negative for adenopathy. Does not bruise/bleed easily.  Psychiatric/Behavioral: Negative for decreased concentration and dysphoric mood. The patient is not nervous/anxious.        Objective:   Physical Exam Constitutional:      General: She is not in acute distress.    Appearance: Normal appearance. She is normal weight. She is not ill-appearing or diaphoretic.     Comments: overwt  HENT:     Head: Normocephalic and atraumatic.     Mouth/Throat:     Mouth: Mucous membranes are moist.      Pharynx: Oropharynx is clear. No posterior oropharyngeal erythema.  Eyes:     General: No scleral icterus.    Extraocular Movements: Extraocular movements intact.     Conjunctiva/sclera: Conjunctivae normal.     Pupils: Pupils are equal, round, and reactive to light.  Neck:     Musculoskeletal: Normal range of motion and neck supple. No muscular tenderness.     Vascular: No carotid bruit.  Cardiovascular:     Rate and Rhythm: Normal rate and regular rhythm.     Heart sounds: Normal heart sounds.  Pulmonary:     Effort: Pulmonary effort is normal. No respiratory distress.     Breath sounds: Normal breath sounds. No wheezing or rales.     Comments: No rib tenderness Abdominal:     General: Abdomen is flat. There is no distension.  Genitourinary:    Comments: R breast: : No mass, nodules, thickening, tenderness, bulging, retraction, inflamation, nipple discharge or skin changes noted.  No axillary or clavicular LA.    Uniformly dense  L breast : some tenderness inferior to the nipple dense breast tissue without mass or fluctuance No nipple d/c or skin color change Tender area is slt warm  No LNs   Musculoskeletal:        General: No swelling.  Lymphadenopathy:     Cervical: No cervical adenopathy.  Skin:    General: Skin is dry.     Findings: No erythema or rash.  Neurological:     General: No focal deficit present.     Mental Status: She is alert.     Sensory: No sensory deficit.  Psychiatric:        Mood and Affect: Mood normal.           Assessment & Plan:   Problem List Items Addressed This Visit      Other   Breast pain, left -  Primary    Suspect mild mastitis which she has had before  Tenderness on exam inferior to the nipple with slight warmth but no redness or lumps  She has dense/ropey breast tissue to start Will tx with keflex for possible bact infection  inst to use warm compress She will update [place] in a week- if symptoms are gone can proceed with  screen mm/ if not consider diag mm and [place]

## 2019-05-13 NOTE — Patient Instructions (Addendum)
Take the keflex as directed  Eat yogurt or take a probiotic while you are on this A warm compress is ok also  Watch for redness or fever  Update if not starting to improve in a week or if worsening    When done with the course- call us and leave a message or email  We will decide on screening/ diagnostic imaging then

## 2019-05-23 ENCOUNTER — Other Ambulatory Visit (HOSPITAL_COMMUNITY): Payer: Self-pay | Admitting: Cardiology

## 2019-05-25 ENCOUNTER — Telehealth: Payer: Self-pay | Admitting: Family Medicine

## 2019-05-25 DIAGNOSIS — N644 Mastodynia: Secondary | ICD-10-CM

## 2019-05-25 NOTE — Telephone Encounter (Signed)
I want to refer her for breast imaging- a diagnostic mammogram and possibly an ultrasound  Is that ok with her?  Let me know and I will do the referral

## 2019-05-25 NOTE — Telephone Encounter (Signed)
Best number [phone]  Pt called to let you know  That kefelx did nothing to help her.  What do you recommend next

## 2019-05-25 NOTE — Telephone Encounter (Signed)
Spoke with patient.  She is fine with moving forward with scheduling both of those.   She is aware that referral coordinator will be in touch with her regarding details.

## 2019-06-01 ENCOUNTER — Ambulatory Visit: Payer: PPO

## 2019-06-01 ENCOUNTER — Other Ambulatory Visit: Payer: Self-pay

## 2019-06-01 ENCOUNTER — Ambulatory Visit
Admission: RE | Admit: 2019-06-01 | Discharge: 2019-06-01 | Disposition: A | Discharge status: Home / Self Care | Payer: PPO | Source: Ambulatory Visit | Attending: Family Medicine | Admitting: Family Medicine

## 2019-06-01 DIAGNOSIS — N644 Mastodynia: Secondary | ICD-10-CM

## 2019-06-01 DIAGNOSIS — R922 Inconclusive mammogram: Secondary | ICD-10-CM | POA: Diagnosis not present

## 2019-07-08 DIAGNOSIS — H43813 Vitreous degeneration, bilateral: Secondary | ICD-10-CM | POA: Diagnosis not present

## 2019-08-25 ENCOUNTER — Telehealth: Payer: Self-pay | Admitting: Family Medicine

## 2019-08-25 DIAGNOSIS — E7800 Pure hypercholesterolemia, unspecified: Secondary | ICD-10-CM

## 2019-08-25 DIAGNOSIS — R7303 Prediabetes: Secondary | ICD-10-CM

## 2019-08-25 DIAGNOSIS — E559 Vitamin D deficiency, unspecified: Secondary | ICD-10-CM

## 2019-08-25 DIAGNOSIS — Z0000 Encounter for general adult medical examination without abnormal findings: Secondary | ICD-10-CM

## 2019-08-25 NOTE — Telephone Encounter (Signed)
-----   Message from Cloyd Stagers, RT sent at 08/19/2019 11:35 AM EDT ----- Regarding: Lab Orders for Friday 9.18.2020 Please place lab orders for Friday 9.18.2020, office visit for physical on Friday 9.25.2020 Thank you, Dyke Maes RT(R)

## 2019-08-26 ENCOUNTER — Other Ambulatory Visit (INDEPENDENT_AMBULATORY_CARE_PROVIDER_SITE_OTHER): Payer: PPO

## 2019-08-26 ENCOUNTER — Ambulatory Visit: Payer: PPO

## 2019-08-26 DIAGNOSIS — R7303 Prediabetes: Secondary | ICD-10-CM

## 2019-08-26 DIAGNOSIS — E7800 Pure hypercholesterolemia, unspecified: Secondary | ICD-10-CM

## 2019-08-26 DIAGNOSIS — Z0000 Encounter for general adult medical examination without abnormal findings: Secondary | ICD-10-CM

## 2019-08-26 DIAGNOSIS — E559 Vitamin D deficiency, unspecified: Secondary | ICD-10-CM

## 2019-08-26 LAB — CBC WITH DIFFERENTIAL/PLATELET
Basophils Absolute: 0 10*3/uL (ref 0.0–0.1)
Basophils Relative: 0.5 % (ref 0.0–3.0)
Eosinophils Absolute: 0.2 10*3/uL (ref 0.0–0.7)
Eosinophils Relative: 3.5 % (ref 0.0–5.0)
HCT: 39.1 % (ref 36.0–46.0)
Hemoglobin: 12.8 g/dL (ref 12.0–15.0)
Lymphocytes Relative: 35.3 % (ref 12.0–46.0)
Lymphs Abs: 1.8 10*3/uL (ref 0.7–4.0)
MCHC: 32.6 g/dL (ref 30.0–36.0)
MCV: 86.1 fl (ref 78.0–100.0)
Monocytes Absolute: 0.3 10*3/uL (ref 0.1–1.0)
Monocytes Relative: 6.7 % (ref 3.0–12.0)
Neutro Abs: 2.8 10*3/uL (ref 1.4–7.7)
Neutrophils Relative %: 54 % (ref 43.0–77.0)
Platelets: 275 10*3/uL (ref 150.0–400.0)
RBC: 4.54 Mil/uL (ref 3.87–5.11)
RDW: 13.2 % (ref 11.5–15.5)
WBC: 5.2 10*3/uL (ref 4.0–10.5)

## 2019-08-26 LAB — COMPREHENSIVE METABOLIC PANEL
ALT: 14 U/L (ref 0–35)
AST: 17 U/L (ref 0–37)
Albumin: 4.1 g/dL (ref 3.5–5.2)
Alkaline Phosphatase: 90 U/L (ref 39–117)
BUN: 10 mg/dL (ref 6–23)
CO2: 28 mEq/L (ref 19–32)
Calcium: 9.2 mg/dL (ref 8.4–10.5)
Chloride: 103 mEq/L (ref 96–112)
Creatinine, Ser: 0.9 mg/dL (ref 0.40–1.20)
GFR: 61.39 mL/min (ref >60.00)
Glucose, Bld: 98 mg/dL (ref 70–99)
Potassium: 4.8 mEq/L (ref 3.5–5.1)
Sodium: 139 mEq/L (ref 135–145)
Total Bilirubin: 0.5 mg/dL (ref 0.2–1.2)
Total Protein: 6.4 g/dL (ref 6.0–8.3)

## 2019-08-26 LAB — LIPID PANEL
Cholesterol: 227 mg/dL — ABNORMAL HIGH (ref 0–200)
HDL: 54.1 mg/dL (ref >39.00)
LDL Cholesterol: 150 mg/dL — ABNORMAL HIGH (ref 0–99)
NonHDL: 173.13
Total CHOL/HDL Ratio: 4
Triglycerides: 117 mg/dL (ref 0.0–149.0)
VLDL: 23.4 mg/dL (ref 0.0–40.0)

## 2019-08-26 LAB — HEMOGLOBIN A1C: Hgb A1c MFr Bld: 5.6 % (ref 4.6–6.5)

## 2019-08-26 LAB — TSH: TSH: 1.38 u[IU]/mL (ref 0.35–4.50)

## 2019-08-26 LAB — VITAMIN D 25 HYDROXY (VIT D DEFICIENCY, FRACTURES): VITD: 86.95 ng/mL (ref 30.00–100.00)

## 2019-09-02 ENCOUNTER — Other Ambulatory Visit: Payer: Self-pay

## 2019-09-02 ENCOUNTER — Ambulatory Visit (INDEPENDENT_AMBULATORY_CARE_PROVIDER_SITE_OTHER): Payer: PPO | Admitting: Family Medicine

## 2019-09-02 ENCOUNTER — Encounter: Payer: Self-pay | Admitting: Family Medicine

## 2019-09-02 VITALS — BP 110/70 | HR 74 | Temp 97.4°F | Ht 63.75 in | Wt 171.5 lb

## 2019-09-02 DIAGNOSIS — I272 Pulmonary hypertension, unspecified: Secondary | ICD-10-CM | POA: Diagnosis not present

## 2019-09-02 DIAGNOSIS — Z23 Encounter for immunization: Secondary | ICD-10-CM | POA: Diagnosis not present

## 2019-09-02 DIAGNOSIS — R7303 Prediabetes: Secondary | ICD-10-CM | POA: Diagnosis not present

## 2019-09-02 DIAGNOSIS — M85851 Other specified disorders of bone density and structure, right thigh: Secondary | ICD-10-CM | POA: Diagnosis not present

## 2019-09-02 DIAGNOSIS — E559 Vitamin D deficiency, unspecified: Secondary | ICD-10-CM | POA: Diagnosis not present

## 2019-09-02 DIAGNOSIS — E7800 Pure hypercholesterolemia, unspecified: Secondary | ICD-10-CM | POA: Diagnosis not present

## 2019-09-02 DIAGNOSIS — F4323 Adjustment disorder with mixed anxiety and depressed mood: Secondary | ICD-10-CM

## 2019-09-02 DIAGNOSIS — Z0000 Encounter for general adult medical examination without abnormal findings: Secondary | ICD-10-CM

## 2019-09-02 MED ORDER — SERTRALINE HCL 50 MG PO TABS: 50.0000 mg | ORAL_TABLET | Freq: Every day | ORAL | 3 refills | Status: DC

## 2019-09-02 MED ORDER — PANTOPRAZOLE SODIUM 40 MG PO TBEC: 40.0000 mg | DELAYED_RELEASE_TABLET | Freq: Every day | ORAL | 3 refills | Status: DC | PRN

## 2019-09-02 MED ORDER — TRAZODONE HCL 100 MG PO TABS: 100.0000 mg | ORAL_TABLET | Freq: Every day | ORAL | 3 refills | Status: DC

## 2019-09-02 NOTE — Patient Instructions (Addendum)
Get back to regular exercise  Indoors or out   For cholesterol  Avoid red meat/ fried foods/ egg yolks/ fatty breakfast meats/ butter, cheese and high fat dairy/ and shellfish    Flu shot today

## 2019-09-02 NOTE — Progress Notes (Signed)
Subjective:    Patient ID: Mary Gordon, female    DOB: 09/24/1946, 73 y.o.   MRN: [id number]  HPI Here for amw and routine health mt exam with rev of chronic medical problems   I have personally reviewed the Medicare Annual Wellness questionnaire and have noted 1. The patient's medical and social history 2. Their use of alcohol, tobacco or illicit drugs 3. Their current medications and supplements 4. The patient's functional ability including ADL's, fall risks, home safety risks and hearing or visual             impairment. 5. Diet and physical activities 6. Evidence for depression or mood disorders  The patients weight, height, BMI have been recorded in the chart and visual acuity is per eye clinic.  I have made referrals, counseling and provided education to the patient based review of the above and I have provided the pt with a written personalized care plan for preventive services. Reviewed and updated provider list, see scanned forms.  See scanned forms.  Routine anticipatory guidance given to patient.  See health maintenance. Colon cancer screening colonoscopy 11/12 Breast cancer screening  Mammogram 6/20 Self breast exam-normal /no more redness  Sister had breast cancer Flu vaccine=-today , hi dose Tetanus vaccine 9/10 Pneumovax completed Zoster vaccine   zostavax 1/10 Dexa 9/18 ordered by Dr Hulan Fray Wants to wait another year  Osteopenia  Falls-none  Fractures- none  Supplements-taking her vit D  D level is 56   Very good  Exercise -getting back to it  Is going to stop her estrace   Advance directive - has a living will and poa  Cognitive function addressed- see scanned forms- and if abnormal then additional documentation follows.  No worries or problems    PMH and SH reviewed  Meds, vitals, and allergies reviewed.   ROS: See HPI.  Otherwise negative.    Doing ok  Has not been able to play pickle ball during covid  Frustrating  She has gone outdoors to  walk recently / and has a treadmill to start using Trying to stay healthy   May want to get a covid test   Weight : Wt Readings from Last 3 Encounters:  09/02/19 171 lb 8 oz (77.8 kg)  05/13/19 170 lb 4 oz (77.2 kg)  12/14/18 158 lb 8 oz (71.9 kg)  she has gained from January  She was eating poorly and now doing better  Really wants to loose weight    She feels terrible when she eats sugar -is stopping  29.67 kg/m   Hearing/vision:  Hearing Screening   125Hz  250Hz  500Hz  1000Hz  2000Hz  3000Hz  4000Hz  6000Hz  8000Hz   Right ear:   40 40 40  40    Left ear:   40 40 40  40    Vision Screening Comments: Eye exam at [facility] in Aug 2020 with Dr. George Ina    H/o past pulmonary HTN No clinical changes and not symptomatic BP Readings from Last 3 Encounters:  09/02/19 110/70  05/13/19 110/68  12/14/18 126/68   Pulse Readings from Last 3 Encounters:  09/02/19 74  05/13/19 80  12/14/18 73    Mood :  Takes sertraline 50 mg daily  Also trazodone 100 mg at bedtime  Doing ok overall - trying to control anxiety issues (misses exercise)    Hyperlipidemia Lab Results  Component Value Date   CHOL 227 (H) 08/26/2019   CHOL 212 (H) 08/20/2018   CHOL 256 (H) 08/19/2017  Lab Results  Component Value Date   HDL 54.10 08/26/2019   HDL 52.80 08/20/2018   HDL 87.60 08/19/2017   Lab Results  Component Value Date   LDLCALC 150 (H) 08/26/2019   LDLCALC 129 (H) 08/20/2018   LDLCALC 150 (H) 08/19/2017   Lab Results  Component Value Date   TRIG 117.0 08/26/2019   TRIG 149.0 08/20/2018   TRIG 89.0 08/19/2017   Lab Results  Component Value Date   CHOLHDL 4 08/26/2019   CHOLHDL 4 08/20/2018   CHOLHDL 3 08/19/2017   Lab Results  Component Value Date   LDLDIRECT 161.5 10/24/2013   LDLDIRECT 153.0 10/15/2012  she was eating badly for a while  Now finally getting back to exercise and better diet  Not a lot of fried foods /red meat  Does eat cheese     Prediabetes Lab  Results  Component Value Date   HGBA1C 5.6 08/26/2019   Patient Active Problem List   Diagnosis Date Noted  . Dysuria 06/09/2018  . Sprain of ankle 06/09/2018  . Laryngopharyngeal reflux (LPR) 01/08/2017  . Pharyngeal dysphagia 01/08/2017  . Prediabetes 08/25/2016  . Routine general medical examination at a health care facility 08/19/2016  . Stress reaction 09/17/2015  . 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 Rosacea 12/27/2014  . Pulmonary HTN ([place]) 10/20/2014  . DOE (dyspnea on exertion) 09/26/2014  . PVCs (premature ventricular contractions) 09/26/2014  . Encounter for Medicare annual wellness exam 03/18/2014  . Fibromyalgia 05/31/2012  . Special screening for malignant neoplasms, colon 09/26/2011  . GERD (gastroesophageal reflux disease) 07/08/2011  . CERVICAL RADICULOPATHY, RIGHT 09/04/2010  . PULMONARY NODULE 09/14/2009  . PALPITATIONS 08/17/2009  . Vitamin D deficiency 02/22/2009  . HYPERCHOLESTEROLEMIA 02/22/2009  . Adjustment disorder with mixed anxiety and depressed mood 02/22/2009  . ALLERGIC RHINITIS 02/22/2009  . MENOPAUSAL SYNDROME 02/22/2009  . Osteopenia 02/22/2009   Past Medical History:  Diagnosis Date  . Allergy    all year  . Anxiety   . Bursitis of hip   . Cataract   . Cervical cancer ([place]) 1978  . Chronic kidney disease    RENAL INSUFF  . Depression   . Dysrhythmia    PVC'S  . Fibromyalgia   . GERD (gastroesophageal reflux disease)   . High cholesterol   . History of Clostridium difficile infection    In past from augmentin  . History of hiatal hernia   . Osteopenia   . Palpitations   . Panic disorder   . Pulmonary HTN ([place]) 10/20/2014   moderate with PASP 44mmHg  . Pulmonary nodule    Being observed  . Vitamin D deficiency    Past Surgical History:  Procedure Laterality Date  . ABDOMINAL HYSTERECTOMY  1978  . CARDIAC CATHETERIZATION    . CATARACT EXTRACTION W/PHACO Right 07/14/2017   Procedure: CATARACT EXTRACTION PHACO AND  INTRAOCULAR LENS PLACEMENT (IOC);  Surgeon: Birder Robson, MD;  Location: [facility] ORS;  Service: Ophthalmology;  Laterality: Right;  [place] 00:31 AP% 15.7 CDE 4.97 Fluid pack lot # [id number]  . CATARACT EXTRACTION W/PHACO Left 08/04/2017   Procedure: CATARACT EXTRACTION PHACO AND INTRAOCULAR LENS PLACEMENT (IOC);  Surgeon: Birder Robson, MD;  Location: [facility] ORS;  Service: Ophthalmology;  Laterality: Left;  [place] 00:34 AP% 18.7 CDE 6.42 Fluid pack lot # [id number]  . COLONOSCOPY  2002   Normal  . HAND SURGERY  2005   Left hand thrombosis  . NASAL HEMORRHAGE CONTROL  2007  . RIGHT HEART CATHETERIZATION N/A 03/16/2015  Procedure: RIGHT HEART CATH;  Surgeon: Larey Dresser, MD;  Location[facility] CATH LAB;  Service: Cardiovascular;  Laterality: N/A;  . SHOULDER SURGERY  2008   Right shoulder bone spur  . THYROGLOSSAL DUCT CYST     Social History   Tobacco Use  . Smoking status: Former Smoker    Packs/day: 1.00    Years: 17.00    Pack years: 17.00    Types: Cigarettes    Quit date: 12/08/1978    Years since quitting: 40.7  . Smokeless tobacco: Never Used  Substance Use Topics  . Alcohol use: No    Alcohol/week: 0.0 standard drinks  . Drug use: No   Family History  Problem Relation Age of Onset  . Heart attack Mother 81  . Emphysema Mother   . Allergies Mother   . Asthma Mother   . Allergies Father   . Glaucoma Father   . Arrhythmia Sister        Atrial fibrillation  . Cancer Sister        breast  . Breast cancer Sister   . Emphysema Sister   . Heart disease Sister   . Rheumatologic disease Sister   . Lung cancer Sister   . Diabetes Other        Parent  . Hyperlipidemia Other        Other relative  . Hypertension Other        Parent, other relative  . Breast cancer Other        Other relative, sister  . Allergies Other        Siblings  . Asthma Other        Sisters  . Colon cancer Neg Hx   . Esophageal cancer Neg Hx   . Rectal cancer Neg Hx   . Stomach cancer Neg Hx     Allergies  Allergen Reactions  . Adhesive [Tape] Rash and Other (See Comments)    "Burns" the skin  . Amoxicillin-Pot Clavulanate Other (See Comments)    Resulted in C-DIFF!!!!  . Simvastatin Other (See Comments)    Caused severe muscle pain   Current Outpatient Medications on File Prior to Visit  Medication Sig Dispense Refill  . chlorpheniramine (CHLOR-TRIMETON) 4 MG tablet Take 8 mg by mouth 2 (two) times daily.     . Cholecalciferol (VITAMIN D3) 10000 units capsule Take 10,000 Units by mouth daily.    . isosorbide mononitrate (IMDUR) 60 MG 24 hr tablet TAKE 1 TABLET (60 MG TOTAL) BY MOUTH DAILY. (Patient taking differently: Take 60 mg by mouth daily. ) 90 tablet 3  . Krill Oil 500 MG CAPS Take 500 mg by mouth daily.    . metoprolol succinate (TOPROL-XL) 25 MG 24 hr tablet TAKE 1 TABLET BY MOUTH EVERY DAY 90 tablet 1  . metroNIDAZOLE (METROCREAM) 0.75 % cream Apply topically 2 (two) times daily as needed. To affected facial areas for rosacea (Patient taking differently: Apply 1 application topically 2 (two) times daily as needed (to affected facial areas for rosacea). ) 45 g 3  . OVER THE COUNTER MEDICATION Take 5-7 mLs by mouth See admin instructions. Life Tones (uric acid cleanser- has celery extract and other herbs) liquid: Take 5-7 ml's by mouth once a day     No current facility-administered medications on file prior to visit.     Review of Systems  Constitutional: Negative for activity change, appetite change, fatigue, fever and unexpected weight change.  HENT: Negative for congestion, ear pain,  rhinorrhea, sinus pressure and sore throat.   Eyes: Negative for pain, redness and visual disturbance.  Respiratory: Negative for cough, shortness of breath and wheezing.   Cardiovascular: Negative for chest pain and palpitations.  Gastrointestinal: Negative for abdominal pain, blood in stool, constipation and diarrhea.  Endocrine: Negative for polydipsia and polyuria.  Genitourinary:  Negative for dysuria, frequency and urgency.  Musculoskeletal: Negative for arthralgias, back pain and myalgias.  Skin: Negative for pallor and rash.  Allergic/Immunologic: Negative for environmental allergies.  Neurological: Negative for dizziness, syncope and headaches.  Hematological: Negative for adenopathy. Does not bruise/bleed easily.  Psychiatric/Behavioral: Negative for decreased concentration and dysphoric mood. The patient is nervous/anxious.        Objective:   Physical Exam Constitutional:      General: She is not in acute distress.    Appearance: Normal appearance. She is well-developed. She is not ill-appearing or diaphoretic.     Comments: overwt  HENT:     Head: Normocephalic and atraumatic.     Right Ear: Tympanic membrane, ear canal and external ear normal.     Left Ear: Tympanic membrane, ear canal and external ear normal.     Nose: Nose normal. No congestion.     Mouth/Throat:     Mouth: Mucous membranes are moist.     Pharynx: Oropharynx is clear. No posterior oropharyngeal erythema.  Eyes:     General: No scleral icterus.    Extraocular Movements: Extraocular movements intact.     Conjunctiva/sclera: Conjunctivae normal.     Pupils: Pupils are equal, round, and reactive to light.  Neck:     Musculoskeletal: Normal range of motion and neck supple. No neck rigidity or muscular tenderness.     Thyroid: No thyromegaly.     Vascular: No carotid bruit or JVD.  Cardiovascular:     Rate and Rhythm: Normal rate and regular rhythm.     Pulses: Normal pulses.     Heart sounds: Normal heart sounds. No gallop.   Pulmonary:     Effort: Pulmonary effort is normal. No respiratory distress.     Breath sounds: Normal breath sounds. No wheezing.     Comments: Good air exch Chest:     Chest wall: No tenderness.  Abdominal:     General: Bowel sounds are normal. There is no distension or abdominal bruit.     Palpations: Abdomen is soft. There is no mass.     Tenderness:  There is no abdominal tenderness.     Hernia: No hernia is present.  Genitourinary:    Comments: Breast exam: No mass, nodules, thickening, tenderness, bulging, retraction, inflamation, nipple discharge or skin changes noted.  No axillary or clavicular LA.     Musculoskeletal: Normal range of motion.        General: No tenderness.     Right lower leg: No edema.     Left lower leg: No edema.  Lymphadenopathy:     Cervical: No cervical adenopathy.  Skin:    General: Skin is warm and dry.     Coloration: Skin is not pale.     Findings: No erythema or rash.     Comments: Solar lentigines diffusely Some sks   Neurological:     Mental Status: She is alert. Mental status is at baseline.     Cranial Nerves: No cranial nerve deficit.     Motor: No weakness or abnormal muscle tone.     Coordination: Coordination normal.     Gait: Gait normal.  Deep Tendon Reflexes: Reflexes are normal and symmetric. Reflexes normal.  Psychiatric:        Mood and Affect: Mood normal.           Assessment & Plan:   Problem List Items Addressed This Visit      Cardiovascular and Mediastinum   Pulmonary HTN ([place])    No clinical changes asymptomatic         Musculoskeletal and Integument   Osteopenia    dexa 9/18 - from Dr Hulan Fray  She no longer sees gyn  Wants to wait a year on next one due to the pandemic No falls or fx D level is 86 Getting back to exercise Plans to stop her estrace         Other   Vitamin D deficiency    .level of 86 Vitamin D level is therapeutic with current supplementation Disc importance of this to bone and overall health       HYPERCHOLESTEROLEMIA    Disc goals for lipids and reasons to control them Rev last labs with pt Rev low sat fat diet in detail LDL is up significantly (after eating badly for a while) Now getting back to healthy diet and exercise  Will watch this  Need to consider statin if no improvement        Adjustment disorder with  mixed anxiety and depressed mood    Continues sertraline and trazodone Reviewed stressors/ coping techniques/symptoms/ support sources/ tx options and side effects in detail today  Sleeps ok  Trying to control anxiety with current pandemic- getting started with exercise again       Encounter for Medicare annual wellness exam - Primary    Reviewed health habits including diet and exercise and skin cancer prevention Reviewed appropriate screening tests for age  Also reviewed health mt list, fam hx and immunization status , as well as social and family history   See HPI Labs reviewed  Flu shot given  Plans to stop estrace Pt wants to wait a year for dexa  Has advance directive  No cognitive concerns  Nl hearing screen  utd eye exam       Routine general medical examination at a health care facility    Reviewed health habits including diet and exercise and skin cancer prevention Reviewed appropriate screening tests for age  Also reviewed health mt list, fam hx and immunization status , as well as social and family history   See HPI Labs reviewed  Flu shot given  Plans to stop estrace Pt wants to wait a year for dexa  Has advance directive  No cognitive concerns  Nl hearing screen  utd eye exam         Prediabetes    Lab Results  Component Value Date   HGBA1C 5.6 08/26/2019   disc imp of low glycemic diet and wt loss to prevent DM2        Other Visit Diagnoses    Need for influenza vaccination       Relevant Orders   Flu Vaccine QUAD High Dose(Fluad) (Completed)

## 2019-09-04 NOTE — Assessment & Plan Note (Signed)
dexa 9/18 - from Dr Hulan Fray  She no longer sees gyn  Wants to wait a year on next one due to the pandemic No falls or fx D level is 86 Getting back to exercise Plans to stop her estrace

## 2019-09-04 NOTE — Assessment & Plan Note (Signed)
.  level of 86 Vitamin D level is therapeutic with current supplementation Disc importance of this to bone and overall health

## 2019-09-04 NOTE — Assessment & Plan Note (Signed)
Lab Results  Component Value Date   HGBA1C 5.6 08/26/2019   disc imp of low glycemic diet and wt loss to prevent DM2

## 2019-09-04 NOTE — Assessment & Plan Note (Signed)
Reviewed health habits including diet and exercise and skin cancer prevention Reviewed appropriate screening tests for age  Also reviewed health mt list, fam hx and immunization status , as well as social and family history   See HPI Labs reviewed  Flu shot given  Plans to stop estrace Pt wants to wait a year for dexa  Has advance directive  No cognitive concerns  Nl hearing screen  utd eye exam

## 2019-09-04 NOTE — Assessment & Plan Note (Signed)
No clinical changes asymptomatic

## 2019-09-04 NOTE — Assessment & Plan Note (Signed)
Continues sertraline and trazodone Reviewed stressors/ coping techniques/symptoms/ support sources/ tx options and side effects in detail today  Sleeps ok  Trying to control anxiety with current pandemic- getting started with exercise again

## 2019-09-04 NOTE — Assessment & Plan Note (Signed)
Disc goals for lipids and reasons to control them Rev last labs with pt Rev low sat fat diet in detail LDL is up significantly (after eating badly for a while) Now getting back to healthy diet and exercise  Will watch this  Need to consider statin if no improvement

## 2019-09-06 ENCOUNTER — Other Ambulatory Visit: Payer: Self-pay | Admitting: *Deleted

## 2019-09-06 MED ORDER — ISOSORBIDE MONONITRATE ER 60 MG PO TB24: ORAL_TABLET | ORAL | 3 refills | Status: DC

## 2019-10-04 ENCOUNTER — Ambulatory Visit (INDEPENDENT_AMBULATORY_CARE_PROVIDER_SITE_OTHER): Payer: PPO | Admitting: Family Medicine

## 2019-10-04 ENCOUNTER — Other Ambulatory Visit: Payer: Self-pay

## 2019-10-04 ENCOUNTER — Encounter: Payer: Self-pay | Admitting: Family Medicine

## 2019-10-04 VITALS — BP 124/70 | HR 81 | Temp 97.1°F | Ht 63.75 in | Wt 170.8 lb

## 2019-10-04 DIAGNOSIS — N3000 Acute cystitis without hematuria: Secondary | ICD-10-CM | POA: Insufficient documentation

## 2019-10-04 DIAGNOSIS — R300 Dysuria: Secondary | ICD-10-CM

## 2019-10-04 LAB — POCT URINALYSIS DIPSTICK
Bilirubin, UA: POSITIVE
Blood, UA: POSITIVE
Glucose, UA: NEGATIVE
Ketones, UA: NEGATIVE
Nitrite, UA: NEGATIVE
Protein, UA: POSITIVE — AB
Spec Grav, UA: >=1.03 — AB (ref 1.010–1.025)
Urobilinogen, UA: NEGATIVE E.U./dL — AB
pH, UA: 6 (ref 5.0–8.0)

## 2019-10-04 MED ORDER — NITROFURANTOIN MONOHYD MACRO 100 MG PO CAPS: 100.0000 mg | ORAL_CAPSULE | Freq: Two times a day (BID) | ORAL | 0 refills | Status: DC

## 2019-10-04 NOTE — Progress Notes (Signed)
Subjective:    Patient ID: Mary Gordon, female    DOB: October 01, 1946, 73 y.o.   MRN: [id number]  HPI Here for urinary symptoms 3 weeks ago (could not come in because Mary Gordon was quarantined for travel)    Urine is cloudy  Urgency and frequency Dysuria  Some bladder pressure   Drinking water   No nausea No fever  Has had a little flank pain    No odor  No blood in urine   Results for orders placed or performed in visit on 10/04/19  POCT Urinalysis Dipstick  Result Value Ref Range   Color, UA yellow    Clarity, UA clear    Glucose, UA Negative Negative   Bilirubin, UA positive    Ketones, UA negative    Spec Grav, UA >=1.030 (A) 1.010 - 1.025   Blood, UA positive    pH, UA 6.0 5.0 - 8.0   Protein, UA Positive (A) Negative   Urobilinogen, UA negative (A) 0.2 or 1.0 E.U./dL   Nitrite, UA negative    Leukocytes, UA Large (3+) (A) Negative   Appearance     Odor       Patient Active Problem List   Diagnosis Date Noted  . Acute cystitis 10/04/2019  . Dysuria 06/09/2018  . Sprain of ankle 06/09/2018  . Laryngopharyngeal reflux (LPR) 01/08/2017  . Pharyngeal dysphagia 01/08/2017  . Prediabetes 08/25/2016  . Routine general medical examination at a health care facility 08/19/2016  . Stress reaction 09/17/2015  . 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 Rosacea 12/27/2014  . Pulmonary HTN ([place]) 10/20/2014  . DOE (dyspnea on exertion) 09/26/2014  . PVCs (premature ventricular contractions) 09/26/2014  . Encounter for Medicare annual wellness exam 03/18/2014  . Fibromyalgia 05/31/2012  . Special screening for malignant neoplasms, colon 09/26/2011  . GERD (gastroesophageal reflux disease) 07/08/2011  . CERVICAL RADICULOPATHY, RIGHT 09/04/2010  . PULMONARY NODULE 09/14/2009  . PALPITATIONS 08/17/2009  . Vitamin D deficiency 02/22/2009  . HYPERCHOLESTEROLEMIA 02/22/2009  . Adjustment disorder with mixed anxiety and depressed mood 02/22/2009  . ALLERGIC RHINITIS  02/22/2009  . MENOPAUSAL SYNDROME 02/22/2009  . Osteopenia 02/22/2009   Past Medical History:  Diagnosis Date  . Allergy    all year  . Anxiety   . Bursitis of hip   . Cataract   . Cervical cancer ([place]) 1978  . Chronic kidney disease    RENAL INSUFF  . Depression   . Dysrhythmia    PVC'S  . Fibromyalgia   . GERD (gastroesophageal reflux disease)   . High cholesterol   . History of Clostridium difficile infection    In past from augmentin  . History of hiatal hernia   . Osteopenia   . Palpitations   . Panic disorder   . Pulmonary HTN ([place]) 10/20/2014   moderate with PASP 45mmHg  . Pulmonary nodule    Being observed  . Vitamin D deficiency    Past Surgical History:  Procedure Laterality Date  . ABDOMINAL HYSTERECTOMY  1978  . CARDIAC CATHETERIZATION    . CATARACT EXTRACTION W/PHACO Right 07/14/2017   Procedure: CATARACT EXTRACTION PHACO AND INTRAOCULAR LENS PLACEMENT (IOC);  Surgeon: Birder Robson, MD;  Location: [facility] ORS;  Service: Ophthalmology;  Laterality: Right;  [place] 00:31 AP% 15.7 CDE 4.97 Fluid pack lot # [id number]  . CATARACT EXTRACTION W/PHACO Left 08/04/2017   Procedure: CATARACT EXTRACTION PHACO AND INTRAOCULAR LENS PLACEMENT (IOC);  Surgeon: Birder Robson, MD;  Location: [facility] ORS;  Service: Ophthalmology;  Laterality: Left;  [place] 00:34 AP% 18.7 CDE 6.42 Fluid pack lot # [id number]  . COLONOSCOPY  2002   Normal  . HAND SURGERY  2005   Left hand thrombosis  . NASAL HEMORRHAGE CONTROL  2007  . RIGHT HEART CATHETERIZATION N/A 03/16/2015   Procedure: RIGHT HEART CATH;  Surgeon: Larey Dresser, MD;  Location: [facility] CATH LAB;  Service: Cardiovascular;  Laterality: N/A;  . SHOULDER SURGERY  2008   Right shoulder bone spur  . THYROGLOSSAL DUCT CYST     Social History   Tobacco Use  . Smoking status: Former Smoker    Packs/day: 1.00    Years: 17.00    Pack years: 17.00    Types: Cigarettes    Quit date: 12/08/1978    Years since quitting: 40.8  . Smokeless  tobacco: Never Used  Substance Use Topics  . Alcohol use: No    Alcohol/week: 0.0 standard drinks  . Drug use: No   Family History  Problem Relation Age of Onset  . Heart attack Mother 31  . Emphysema Mother   . Allergies Mother   . Asthma Mother   . Allergies Father   . Glaucoma Father   . Arrhythmia Sister        Atrial fibrillation  . Cancer Sister        breast  . Breast cancer Sister   . Emphysema Sister   . Heart disease Sister   . Rheumatologic disease Sister   . Lung cancer Sister   . Diabetes Other        Parent  . Hyperlipidemia Other        Other relative  . Hypertension Other        Parent, other relative  . Breast cancer Other        Other relative, sister  . Allergies Other        Siblings  . Asthma Other        Sisters  . Colon cancer Neg Hx   . Esophageal cancer Neg Hx   . Rectal cancer Neg Hx   . Stomach cancer Neg Hx    Allergies  Allergen Reactions  . Adhesive [Tape] Rash and Other (See Comments)    "Burns" the skin  . Amoxicillin-Pot Clavulanate Other (See Comments)    Resulted in C-DIFF!!!!  . Simvastatin Other (See Comments)    Caused severe muscle pain   Current Outpatient Medications on File Prior to Visit  Medication Sig Dispense Refill  . chlorpheniramine (CHLOR-TRIMETON) 4 MG tablet Take 8 mg by mouth 2 (two) times daily.     . Cholecalciferol (VITAMIN D3) 10000 units capsule Take 10,000 Units by mouth daily.    . isosorbide mononitrate (IMDUR) 60 MG 24 hr tablet TAKE 1 TABLET (60 MG TOTAL) BY MOUTH DAILY. 90 tablet 3  . Krill Oil 500 MG CAPS Take 500 mg by mouth daily.    . metoprolol succinate (TOPROL-XL) 25 MG 24 hr tablet TAKE 1 TABLET BY MOUTH EVERY DAY 90 tablet 1  . metroNIDAZOLE (METROCREAM) 0.75 % cream Apply topically 2 (two) times daily as needed. To affected facial areas for rosacea (Patient taking differently: Apply 1 application topically 2 (two) times daily as needed (to affected facial areas for rosacea). ) 45 g 3  .  OVER THE COUNTER MEDICATION Take 5-7 mLs by mouth See admin instructions. Life Tones (uric acid cleanser- has celery extract and other herbs) liquid: Take 5-7 ml's by mouth once a day    .  pantoprazole (PROTONIX) 40 MG tablet Take 1 tablet (40 mg total) by mouth daily as needed (for reflux). 90 tablet 3  . sertraline (ZOLOFT) 50 MG tablet Take 1 tablet (50 mg total) by mouth daily. 90 tablet 3  . traZODone (DESYREL) 100 MG tablet Take 1 tablet (100 mg total) by mouth at bedtime. 90 tablet 3   No current facility-administered medications on file prior to visit.     Review of Systems  Constitutional: Positive for fatigue. Negative for activity change, appetite change and fever.  HENT: Negative for congestion and sore throat.   Eyes: Negative for itching and visual disturbance.  Respiratory: Negative for cough and shortness of breath.   Cardiovascular: Negative for leg swelling.  Gastrointestinal: Negative for abdominal distention, abdominal pain, constipation, diarrhea and nausea.  Endocrine: Negative for cold intolerance and polydipsia.  Genitourinary: Positive for dysuria, frequency and urgency. Negative for difficulty urinating, flank pain and hematuria.  Musculoskeletal: Negative for myalgias.  Skin: Negative for rash.  Allergic/Immunologic: Negative for immunocompromised state.  Neurological: Negative for dizziness and weakness.  Hematological: Negative for adenopathy.       Objective:   Physical Exam Constitutional:      General: Mary Gordon is not in acute distress.    Appearance: Normal appearance. Mary Gordon is well-developed. Mary Gordon is obese. Mary Gordon is not ill-appearing or diaphoretic.  HENT:     Head: Normocephalic and atraumatic.  Eyes:     Conjunctiva/sclera: Conjunctivae normal.     Pupils: Pupils are equal, round, and reactive to light.  Neck:     Musculoskeletal: Normal range of motion and neck supple.  Cardiovascular:     Rate and Rhythm: Normal rate and regular rhythm.     Heart  sounds: Normal heart sounds.  Pulmonary:     Effort: Pulmonary effort is normal.     Breath sounds: Normal breath sounds.  Abdominal:     General: Bowel sounds are normal. There is no distension.     Palpations: Abdomen is soft.     Tenderness: There is abdominal tenderness. There is no rebound.     Comments: No cva tenderness No suprapubic tenderness or fullness   Voices general discomfort in bladder area w/o palpation   Lymphadenopathy:     Cervical: No cervical adenopathy.  Skin:    Findings: No rash.  Neurological:     Mental Status: Mary Gordon is alert.           Assessment & Plan:   Problem List Items Addressed This Visit      Genitourinary   Acute cystitis - Primary    Pos ua with voiding symptoms cx pending Cover with macrobid (did well in the past with)  Enc water intake  Handout given  Will contact with result of culture  inst to call if symptoms suddenly worsen      Relevant Orders   Urine Culture     Other   Dysuria   Relevant Orders   POCT Urinalysis Dipstick (Completed)

## 2019-10-04 NOTE — Assessment & Plan Note (Signed)
Pos ua with voiding symptoms cx pending Cover with macrobid (did well in the past with)  Enc water intake  Handout given  Will contact with result of culture  inst to call if symptoms suddenly worsen

## 2019-10-04 NOTE — Patient Instructions (Addendum)
Drink lots of water Take macrobid as directed   Please alert me if symptoms suddenly worsen    Urinary Tract Infection, Adult  A urinary tract infection (UTI) is an infection of any part of the urinary tract. The urinary tract includes the kidneys, ureters, bladder, and urethra. These organs make, store, and get rid of urine in the body. Your health care provider may use other names to describe the infection. An upper UTI affects the ureters and kidneys (pyelonephritis). A lower UTI affects the bladder (cystitis) and urethra (urethritis). What are the causes? Most urinary tract infections are caused by bacteria in your genital area, around the entrance to your urinary tract (urethra). These bacteria grow and cause inflammation of your urinary tract. What increases the risk? You are more likely to develop this condition if:  You have a urinary catheter that stays in place (indwelling).  You are not able to control when you urinate or have a bowel movement (you have incontinence).  You are female and you: ? Use a spermicide or diaphragm for birth control. ? Have low estrogen levels. ? Are pregnant.  You have certain genes that increase your risk (genetics).  You are sexually active.  You take antibiotic medicines.  You have a condition that causes your flow of urine to slow down, such as: ? An enlarged prostate, if you are female. ? Blockage in your urethra (stricture). ? A kidney stone. ? A nerve condition that affects your bladder control (neurogenic bladder). ? Not getting enough to drink, or not urinating often.  You have certain medical conditions, such as: ? Diabetes. ? A weak disease-fighting system (immunesystem). ? Sickle cell disease. ? Gout. ? Spinal cord injury. What are the signs or symptoms? Symptoms of this condition include:  Needing to urinate right away (urgently).  Frequent urination or passing small amounts of urine frequently.  Pain or burning with  urination.  Blood in the urine.  Urine that smells bad or unusual.  Trouble urinating.  Cloudy urine.  Vaginal discharge, if you are female.  Pain in the abdomen or the lower back. You may also have:  Vomiting or a decreased appetite.  Confusion.  Irritability or tiredness.  A fever.  Diarrhea. The first symptom in older adults may be confusion. In some cases, they may not have any symptoms until the infection has worsened. How is this diagnosed? This condition is diagnosed based on your medical history and a physical exam. You may also have other tests, including:  Urine tests.  Blood tests.  Tests for sexually transmitted infections (STIs). If you have had more than one UTI, a cystoscopy or imaging studies may be done to determine the cause of the infections. How is this treated? Treatment for this condition includes:  Antibiotic medicine.  Over-the-counter medicines to treat discomfort.  Drinking enough water to stay hydrated. If you have frequent infections or have other conditions such as a kidney stone, you may need to see a health care provider who specializes in the urinary tract (urologist). In rare cases, urinary tract infections can cause sepsis. Sepsis is a life-threatening condition that occurs when the body responds to an infection. Sepsis is treated in the hospital with IV antibiotics, fluids, and other medicines. Follow these instructions at home:  Medicines  Take over-the-counter and prescription medicines only as told by your health care provider.  If you were prescribed an antibiotic medicine, take it as told by your health care provider. Do not stop using the  antibiotic even if you start to feel better. General instructions  Make sure you: ? Empty your bladder often and completely. Do not hold urine for long periods of time. ? Empty your bladder after sex. ? Wipe from front to back after a bowel movement if you are female. Use each tissue  one time when you wipe.  Drink enough fluid to keep your urine pale yellow.  Keep all follow-up visits as told by your health care provider. This is important. Contact a health care provider if:  Your symptoms do not get better after 1-2 days.  Your symptoms go away and then return. Get help right away if you have:  Severe pain in your back or your lower abdomen.  A fever.  Nausea or vomiting. Summary  A urinary tract infection (UTI) is an infection of any part of the urinary tract, which includes the kidneys, ureters, bladder, and urethra.  Most urinary tract infections are caused by bacteria in your genital area, around the entrance to your urinary tract (urethra).  Treatment for this condition often includes antibiotic medicines.  If you were prescribed an antibiotic medicine, take it as told by your health care provider. Do not stop using the antibiotic even if you start to feel better.  Keep all follow-up visits as told by your health care provider. This is important. This information is not intended to replace advice given to you by your health care provider. Make sure you discuss any questions you have with your health care provider. Document Released: 09/03/2005 Document Revised: 11/11/2018 Document Reviewed: 06/03/2018 Elsevier Patient Education  2020 [employer].

## 2019-10-06 LAB — URINE CULTURE
MICRO NUMBER:: 1035027
SPECIMEN QUALITY:: ADEQUATE

## 2019-11-22 ENCOUNTER — Other Ambulatory Visit (HOSPITAL_COMMUNITY): Payer: Self-pay | Admitting: Internal Medicine

## 2020-05-08 ENCOUNTER — Encounter: Payer: Self-pay | Admitting: Primary Care

## 2020-05-08 ENCOUNTER — Other Ambulatory Visit: Payer: Self-pay

## 2020-05-08 ENCOUNTER — Ambulatory Visit (INDEPENDENT_AMBULATORY_CARE_PROVIDER_SITE_OTHER): Payer: PPO | Admitting: Primary Care

## 2020-05-08 VITALS — BP 124/70 | HR 84 | Temp 95.9°F | Ht 63.75 in | Wt 165.5 lb

## 2020-05-08 DIAGNOSIS — R300 Dysuria: Secondary | ICD-10-CM

## 2020-05-08 LAB — POC URINALSYSI DIPSTICK (AUTOMATED)
Bilirubin, UA: NEGATIVE
Glucose, UA: NEGATIVE
Ketones, UA: NEGATIVE
Nitrite, UA: NEGATIVE
Protein, UA: POSITIVE — AB
Spec Grav, UA: 1.015 (ref 1.010–1.025)
Urobilinogen, UA: 0.2 E.U./dL
pH, UA: 6 (ref 5.0–8.0)

## 2020-05-08 MED ORDER — SULFAMETHOXAZOLE-TRIMETHOPRIM 800-160 MG PO TABS: 1.0000 | ORAL_TABLET | Freq: Two times a day (BID) | ORAL | 0 refills | Status: DC

## 2020-05-08 NOTE — Assessment & Plan Note (Signed)
Acute for the last three weeks, no improvement over time. History of acute cystitis.   UA today with 3+ leuks, 1+ blood. Culture sent.   Given symptoms coupled with medical history and UA results, will treat for presumed UTI.  Rx for Bactrim course sent to pharmacy.  Await culture results.

## 2020-05-08 NOTE — Progress Notes (Signed)
Subjective:    Patient ID: Mary Gordon, female    DOB: [birth year], 74 y.o.   MRN: [id number]  HPI  This visit occurred during the SARS-CoV-2 public health emergency.  Safety protocols were in place, including screening questions prior to the visit, additional usage of staff PPE, and extensive cleaning of exam room while observing appropriate contact time as indicated for disinfecting solutions.   Ms. Mary Gordon is a 74 year old female patient of Dr. Glori Bickers with a medical history of acute cystitis, prediabetes, GERD, fibromyalgia, dysuria who presents today with a chief complaint of dysuria.  She also reports urinary frequency. She denies vaginal symptoms, pelvic pressure, hematuria. She's increased her water intake recently in an attempt to "flush out" the infection. She's not taken anything OTC for symptoms. She was treated with Macrobid in October 2020 and developed severe abdominal cramping and diarrhea, she does not wish to take this again. Symptoms began about 3 weeks ago.   BP Readings from Last 3 Encounters:  05/08/20 124/70  10/04/19 124/70  09/02/19 110/70     Review of Systems  Constitutional: Negative for fever.  Gastrointestinal: Negative for abdominal pain and nausea.  Genitourinary: Positive for dysuria and frequency. Negative for flank pain, hematuria, pelvic pain and vaginal discharge.       Past Medical History:  Diagnosis Date  . Allergy    all year  . Anxiety   . Bursitis of hip   . Cataract   . Cervical cancer ([place]) 1978  . Chronic kidney disease    RENAL INSUFF  . Depression   . Dysrhythmia    PVC'S  . Fibromyalgia   . GERD (gastroesophageal reflux disease)   . High cholesterol   . History of Clostridium difficile infection    In past from augmentin  . History of hiatal hernia   . Osteopenia   . Palpitations   . Panic disorder   . Pulmonary HTN ([place]) 10/20/2014   moderate with PASP 71mmHg  . Pulmonary nodule    Being observed  . Vitamin D  deficiency      Social History   Socioeconomic History  . Marital status: Married    Spouse name: Not on file  . Number of children: Not on file  . Years of education: Not on file  . Highest education level: Not on file  Occupational History  . Occupation: Retired from book keeping  Tobacco Use  . Smoking status: Former Smoker    Packs/day: 1.00    Years: 17.00    Pack years: 17.00    Types: Cigarettes    Quit date: 12/08/1978    Years since quitting: 41.4  . Smokeless tobacco: Never Used  Substance and Sexual Activity  . Alcohol use: No    Alcohol/week: 0.0 standard drinks  . Drug use: No  . Sexual activity: Yes    Partners: Male  Other Topics Concern  . Not on file  Social History Narrative   Married (husband is colon cancer survivor)   Does not get regular exercise   G4P1   Social Determinants of Health   Financial Resource Strain:   . Difficulty of Paying Living Expenses:   Food Insecurity:   . Worried About Charity fundraiser in the Last Year:   . Arboriculturist in the Last Year:   Transportation Needs:   . Film/video editor (Medical):   Marland Kitchen Lack of Transportation (Non-Medical):   Physical Activity:   . Days  of Exercise per Week:   . Minutes of Exercise per Session:   Stress:   . Feeling of Stress :   Social Connections:   . Frequency of Communication with Friends and Family:   . Frequency of Social Gatherings with Friends and Family:   . Attends Religious Services:   . Active Member of Clubs or Organizations:   . Attends Archivist Meetings:   Marland Kitchen Marital Status:   Intimate Partner Violence:   . Fear of Current or Ex-Partner:   . Emotionally Abused:   Marland Kitchen Physically Abused:   . Sexually Abused:     Past Surgical History:  Procedure Laterality Date  . ABDOMINAL HYSTERECTOMY  1978  . CARDIAC CATHETERIZATION    . CATARACT EXTRACTION W/PHACO Right 07/14/2017   Procedure: CATARACT EXTRACTION PHACO AND INTRAOCULAR LENS PLACEMENT (IOC);   Surgeon: Birder Robson, MD;  Location: [facility] ORS;  Service: Ophthalmology;  Laterality: Right;  [place] 00:31 AP% 15.7 CDE 4.97 Fluid pack lot # [id number]  . CATARACT EXTRACTION W/PHACO Left 08/04/2017   Procedure: CATARACT EXTRACTION PHACO AND INTRAOCULAR LENS PLACEMENT (IOC);  Surgeon: Birder Robson, MD;  Location: [facility] ORS;  Service: Ophthalmology;  Laterality: Left;  [place] 00:34 AP% 18.7 CDE 6.42 Fluid pack lot # [id number]  . COLONOSCOPY  2002   Normal  . HAND SURGERY  2005   Left hand thrombosis  . NASAL HEMORRHAGE CONTROL  2007  . RIGHT HEART CATHETERIZATION N/A 03/16/2015   Procedure: RIGHT HEART CATH;  Surgeon: Larey Dresser, MD;  Location: [facility] CATH LAB;  Service: Cardiovascular;  Laterality: N/A;  . SHOULDER SURGERY  2008   Right shoulder bone spur  . THYROGLOSSAL DUCT CYST      Family History  Problem Relation Age of Onset  . Heart attack Mother 19  . Emphysema Mother   . Allergies Mother   . Asthma Mother   . Allergies Father   . Glaucoma Father   . Arrhythmia Sister        Atrial fibrillation  . Cancer Sister        breast  . Breast cancer Sister   . Emphysema Sister   . Heart disease Sister   . Rheumatologic disease Sister   . Lung cancer Sister   . Diabetes Other        Parent  . Hyperlipidemia Other        Other relative  . Hypertension Other        Parent, other relative  . Breast cancer Other        Other relative, sister  . Allergies Other        Siblings  . Asthma Other        Sisters  . Colon cancer Neg Hx   . Esophageal cancer Neg Hx   . Rectal cancer Neg Hx   . Stomach cancer Neg Hx     Allergies  Allergen Reactions  . Adhesive [Tape] Rash and Other (See Comments)    "Burns" the skin  . Amoxicillin-Pot Clavulanate Other (See Comments)    Resulted in C-DIFF!!!!  . Macrobid [Nitrofurantoin] Diarrhea    Severe abdominal cramping  . Simvastatin Other (See Comments)    Caused severe muscle pain    Current Outpatient Medications on File  Prior to Visit  Medication Sig Dispense Refill  . chlorpheniramine (CHLOR-TRIMETON) 4 MG tablet Take 8 mg by mouth 2 (two) times daily.     . Cholecalciferol (VITAMIN D3) 10000 units capsule Take 10,000 Units  by mouth daily.    . isosorbide mononitrate (IMDUR) 60 MG 24 hr tablet TAKE 1 TABLET (60 MG TOTAL) BY MOUTH DAILY. 90 tablet 3  . metoprolol succinate (TOPROL-XL) 25 MG 24 hr tablet TAKE 1 TABLET BY MOUTH EVERY DAY 90 tablet 1  . metroNIDAZOLE (METROCREAM) 0.75 % cream Apply topically 2 (two) times daily as needed. To affected facial areas for rosacea (Patient taking differently: Apply 1 application topically 2 (two) times daily as needed (to affected facial areas for rosacea). ) 45 g 3  . OVER THE COUNTER MEDICATION Take 5-7 mLs by mouth See admin instructions. Life Tones (uric acid cleanser- has celery extract and other herbs) liquid: Take 5-7 ml's by mouth once a day    . pantoprazole (PROTONIX) 40 MG tablet Take 1 tablet (40 mg total) by mouth daily as needed (for reflux). 90 tablet 3  . sertraline (ZOLOFT) 50 MG tablet Take 1 tablet (50 mg total) by mouth daily. 90 tablet 3  . traZODone (DESYREL) 100 MG tablet Take 1 tablet (100 mg total) by mouth at bedtime. 90 tablet 3   No current facility-administered medications on file prior to visit.    BP 124/70   Pulse 84   Temp (!) 95.9 F (35.5 C) (Temporal)   Ht 5' 3.75" (1.619 m)   Wt 165 lb 8 oz (75.1 kg)   SpO2 98%   BMI 28.63 kg/m    Objective:   Physical Exam  Constitutional: She appears well-nourished.  GI: Soft. There is no abdominal tenderness. There is no CVA tenderness.  Skin: Skin is warm and dry.           Assessment & Plan:

## 2020-05-08 NOTE — Patient Instructions (Signed)
Start Bactrim DS (sulfamethoxazole/trimethoprim) tablets for urinary tract infection. Take 1 tablet by mouth twice daily for 5 days.  Continue to work on good water intake as discussed.  It was a pleasure meeting you!

## 2020-05-10 LAB — URINE CULTURE
MICRO NUMBER:: 10538687
SPECIMEN QUALITY:: ADEQUATE

## 2020-07-02 ENCOUNTER — Encounter: Payer: Self-pay | Admitting: Family Medicine

## 2020-07-03 ENCOUNTER — Other Ambulatory Visit: Payer: Self-pay

## 2020-07-03 MED ORDER — METOPROLOL SUCCINATE ER 25 MG PO TB24: 25.0000 mg | ORAL_TABLET | Freq: Every day | ORAL | 0 refills | Status: DC

## 2020-07-12 ENCOUNTER — Other Ambulatory Visit: Payer: Self-pay | Admitting: Family Medicine

## 2020-07-12 DIAGNOSIS — H35371 Puckering of macula, right eye: Secondary | ICD-10-CM | POA: Diagnosis not present

## 2020-07-12 DIAGNOSIS — Z1231 Encounter for screening mammogram for malignant neoplasm of breast: Secondary | ICD-10-CM

## 2020-07-24 ENCOUNTER — Ambulatory Visit
Admission: RE | Admit: 2020-07-24 | Discharge: 2020-07-24 | Disposition: A | Discharge status: Home / Self Care | Payer: PPO | Source: Ambulatory Visit | Attending: Family Medicine | Admitting: Family Medicine

## 2020-07-24 ENCOUNTER — Other Ambulatory Visit: Payer: Self-pay

## 2020-07-24 DIAGNOSIS — Z1231 Encounter for screening mammogram for malignant neoplasm of breast: Secondary | ICD-10-CM | POA: Diagnosis not present

## 2020-08-26 ENCOUNTER — Telehealth: Payer: Self-pay | Admitting: Family Medicine

## 2020-08-26 DIAGNOSIS — R7303 Prediabetes: Secondary | ICD-10-CM

## 2020-08-26 DIAGNOSIS — E7800 Pure hypercholesterolemia, unspecified: Secondary | ICD-10-CM

## 2020-08-26 DIAGNOSIS — Z0000 Encounter for general adult medical examination without abnormal findings: Secondary | ICD-10-CM

## 2020-08-26 DIAGNOSIS — E559 Vitamin D deficiency, unspecified: Secondary | ICD-10-CM

## 2020-08-26 NOTE — Telephone Encounter (Signed)
-----   Message from Ellamae Sia sent at 08/15/2020 11:44 AM EDT ----- Regarding: Lab orders for Monday,9.20.21 Patient is scheduled for CPX labs, please order future labs, Thanks , Karna Christmas

## 2020-08-27 ENCOUNTER — Other Ambulatory Visit: Payer: Self-pay

## 2020-08-27 ENCOUNTER — Other Ambulatory Visit (INDEPENDENT_AMBULATORY_CARE_PROVIDER_SITE_OTHER): Payer: PPO

## 2020-08-27 DIAGNOSIS — E559 Vitamin D deficiency, unspecified: Secondary | ICD-10-CM

## 2020-08-27 DIAGNOSIS — Z0000 Encounter for general adult medical examination without abnormal findings: Secondary | ICD-10-CM

## 2020-08-27 DIAGNOSIS — R7303 Prediabetes: Secondary | ICD-10-CM | POA: Diagnosis not present

## 2020-08-27 DIAGNOSIS — E7800 Pure hypercholesterolemia, unspecified: Secondary | ICD-10-CM | POA: Diagnosis not present

## 2020-08-27 LAB — LIPID PANEL
Cholesterol: 228 mg/dL — ABNORMAL HIGH (ref 0–200)
HDL: 53.8 mg/dL (ref >39.00)
LDL Cholesterol: 154 mg/dL — ABNORMAL HIGH (ref 0–99)
NonHDL: 173.9
Total CHOL/HDL Ratio: 4
Triglycerides: 102 mg/dL (ref 0.0–149.0)
VLDL: 20.4 mg/dL (ref 0.0–40.0)

## 2020-08-27 LAB — HEMOGLOBIN A1C: Hgb A1c MFr Bld: 5.7 % (ref 4.6–6.5)

## 2020-08-27 LAB — CBC WITH DIFFERENTIAL/PLATELET
Basophils Absolute: 0 10*3/uL (ref 0.0–0.1)
Basophils Relative: 0.5 % (ref 0.0–3.0)
Eosinophils Absolute: 0.2 10*3/uL (ref 0.0–0.7)
Eosinophils Relative: 3.6 % (ref 0.0–5.0)
HCT: 37.7 % (ref 36.0–46.0)
Hemoglobin: 12.2 g/dL (ref 12.0–15.0)
Lymphocytes Relative: 34.4 % (ref 12.0–46.0)
Lymphs Abs: 1.8 10*3/uL (ref 0.7–4.0)
MCHC: 32.4 g/dL (ref 30.0–36.0)
MCV: 85.6 fl (ref 78.0–100.0)
Monocytes Absolute: 0.3 10*3/uL (ref 0.1–1.0)
Monocytes Relative: 6.4 % (ref 3.0–12.0)
Neutro Abs: 2.8 10*3/uL (ref 1.4–7.7)
Neutrophils Relative %: 55.1 % (ref 43.0–77.0)
Platelets: 264 10*3/uL (ref 150.0–400.0)
RBC: 4.4 Mil/uL (ref 3.87–5.11)
RDW: 13.4 % (ref 11.5–15.5)
WBC: 5.1 10*3/uL (ref 4.0–10.5)

## 2020-08-27 LAB — COMPREHENSIVE METABOLIC PANEL
ALT: 12 U/L (ref 0–35)
AST: 15 U/L (ref 0–37)
Albumin: 4.1 g/dL (ref 3.5–5.2)
Alkaline Phosphatase: 97 U/L (ref 39–117)
BUN: 12 mg/dL (ref 6–23)
CO2: 30 mEq/L (ref 19–32)
Calcium: 9 mg/dL (ref 8.4–10.5)
Chloride: 102 mEq/L (ref 96–112)
Creatinine, Ser: 0.92 mg/dL (ref 0.40–1.20)
GFR: 59.68 mL/min — ABNORMAL LOW (ref >60.00)
Glucose, Bld: 92 mg/dL (ref 70–99)
Potassium: 4.5 mEq/L (ref 3.5–5.1)
Sodium: 138 mEq/L (ref 135–145)
Total Bilirubin: 0.7 mg/dL (ref 0.2–1.2)
Total Protein: 6.2 g/dL (ref 6.0–8.3)

## 2020-08-27 LAB — TSH: TSH: 2.04 u[IU]/mL (ref 0.35–4.50)

## 2020-08-27 LAB — VITAMIN D 25 HYDROXY (VIT D DEFICIENCY, FRACTURES): VITD: 72.43 ng/mL (ref 30.00–100.00)

## 2020-09-03 ENCOUNTER — Encounter: Payer: PPO | Admitting: Family Medicine

## 2020-09-07 ENCOUNTER — Ambulatory Visit (INDEPENDENT_AMBULATORY_CARE_PROVIDER_SITE_OTHER): Payer: PPO | Admitting: Family Medicine

## 2020-09-07 ENCOUNTER — Other Ambulatory Visit: Payer: Self-pay

## 2020-09-07 ENCOUNTER — Encounter: Payer: Self-pay | Admitting: Family Medicine

## 2020-09-07 VITALS — BP 118/68 | HR 81 | Temp 97.2°F | Ht 63.75 in | Wt 164.4 lb

## 2020-09-07 DIAGNOSIS — M85851 Other specified disorders of bone density and structure, right thigh: Secondary | ICD-10-CM | POA: Diagnosis not present

## 2020-09-07 DIAGNOSIS — E559 Vitamin D deficiency, unspecified: Secondary | ICD-10-CM | POA: Diagnosis not present

## 2020-09-07 DIAGNOSIS — Z0000 Encounter for general adult medical examination without abnormal findings: Secondary | ICD-10-CM | POA: Diagnosis not present

## 2020-09-07 DIAGNOSIS — F4323 Adjustment disorder with mixed anxiety and depressed mood: Secondary | ICD-10-CM | POA: Diagnosis not present

## 2020-09-07 DIAGNOSIS — R7303 Prediabetes: Secondary | ICD-10-CM

## 2020-09-07 DIAGNOSIS — E7800 Pure hypercholesterolemia, unspecified: Secondary | ICD-10-CM

## 2020-09-07 DIAGNOSIS — K219 Gastro-esophageal reflux disease without esophagitis: Secondary | ICD-10-CM | POA: Diagnosis not present

## 2020-09-07 DIAGNOSIS — Z23 Encounter for immunization: Secondary | ICD-10-CM

## 2020-09-07 DIAGNOSIS — I272 Pulmonary hypertension, unspecified: Secondary | ICD-10-CM | POA: Diagnosis not present

## 2020-09-07 DIAGNOSIS — E2839 Other primary ovarian failure: Secondary | ICD-10-CM | POA: Diagnosis not present

## 2020-09-07 MED ORDER — FAMOTIDINE 20 MG PO TABS: 20.0000 mg | ORAL_TABLET | Freq: Two times a day (BID) | ORAL | 3 refills | Status: DC

## 2020-09-07 MED ORDER — PANTOPRAZOLE SODIUM 40 MG PO TBEC
40.0000 mg | DELAYED_RELEASE_TABLET | Freq: Every day | ORAL | 3 refills | Status: DC | PRN
Start: 2020-09-07 — End: 2021-05-02

## 2020-09-07 MED ORDER — METOPROLOL SUCCINATE ER 25 MG PO TB24
25.0000 mg | ORAL_TABLET | Freq: Every day | ORAL | 3 refills | Status: DC
Start: 2020-09-07 — End: 2021-11-27

## 2020-09-07 MED ORDER — TRAZODONE HCL 100 MG PO TABS
100.0000 mg | ORAL_TABLET | Freq: Every day | ORAL | 3 refills | Status: DC
Start: 2020-09-07 — End: 2021-08-29

## 2020-09-07 MED ORDER — ROSUVASTATIN CALCIUM 5 MG PO TABS: ORAL_TABLET | ORAL | 11 refills | Status: DC

## 2020-09-07 MED ORDER — ISOSORBIDE MONONITRATE ER 60 MG PO TB24
ORAL_TABLET | ORAL | 3 refills | Status: DC
Start: 2020-09-07 — End: 2022-03-17

## 2020-09-07 MED ORDER — SERTRALINE HCL 50 MG PO TABS
50.0000 mg | ORAL_TABLET | Freq: Every day | ORAL | 3 refills | Status: DC
Start: 2020-09-07 — End: 2021-10-11

## 2020-09-07 NOTE — Patient Instructions (Addendum)
If you are interested in the new shingles vaccine (Shingrix) - call your local pharmacy to check on coverage and availability  If affordable, get on a wait list at your pharmacy to get the vaccine.   Continue the protonix  Add pepcid 20 twice daily to help more and see if this helps cough and throat clearing   Flu shot today   Try the crestor twice weekly  Stop at checkout to schedule fasting labs in about 6 weeks
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
## 2020-09-09 NOTE — Assessment & Plan Note (Signed)
Reviewed health habits including diet and exercise and skin cancer prevention Reviewed appropriate screening tests for age  Also reviewed health mt list, fam hx and immunization status , as well as social and family history   See HPI Labs reviewed  Flu vaccine given covid immunized Discussed shingrix vaccine  dexa ref done (no falls or fx and taking D with tx level) Great exercise  Advance directive utd  No cognitive concerns Nl hearing screen and vision /eye exam utd

## 2020-09-09 NOTE — Assessment & Plan Note (Signed)
Still no symptoms at all

## 2020-09-09 NOTE — Assessment & Plan Note (Signed)
Rev dexa 1/17 dexa ref done  No falls or fx  Good D level and good exercise

## 2020-09-09 NOTE — Assessment & Plan Note (Signed)
Continues sertraline and trazodone  Caring for her father and also stress at home with husband's mental state  Overall good coping skills

## 2020-09-09 NOTE — Assessment & Plan Note (Signed)
Not totally controlled with protonix 40  Will add pepcid 20 mg bid  inst to update if this does not resolve symptoms

## 2020-09-09 NOTE — Assessment & Plan Note (Signed)
LDL in 150s still  Intol to simvastatin in the past  Diet is good Will try low dose crestor twice weekly  inst to let us know if side effects Check lab in 6 wk

## 2020-09-20 ENCOUNTER — Encounter: Payer: Self-pay | Admitting: Family Medicine

## 2020-09-20 ENCOUNTER — Ambulatory Visit (INDEPENDENT_AMBULATORY_CARE_PROVIDER_SITE_OTHER): Payer: PPO | Admitting: Family Medicine

## 2020-09-20 ENCOUNTER — Other Ambulatory Visit: Payer: Self-pay

## 2020-09-20 VITALS — BP 116/70 | HR 63 | Temp 96.9°F | Ht 63.75 in | Wt 165.4 lb

## 2020-09-20 DIAGNOSIS — S41111A Laceration without foreign body of right upper arm, initial encounter: Secondary | ICD-10-CM

## 2020-09-20 DIAGNOSIS — S41119A Laceration without foreign body of unspecified upper arm, initial encounter: Secondary | ICD-10-CM | POA: Insufficient documentation

## 2020-09-20 DIAGNOSIS — Z23 Encounter for immunization: Secondary | ICD-10-CM | POA: Diagnosis not present

## 2020-09-20 DIAGNOSIS — W540XXA Bitten by dog, initial encounter: Secondary | ICD-10-CM | POA: Diagnosis not present

## 2020-09-20 MED ORDER — DOXYCYCLINE HYCLATE 100 MG PO TABS: 100.0000 mg | ORAL_TABLET | Freq: Two times a day (BID) | ORAL | 0 refills | Status: DC

## 2020-09-20 NOTE — Patient Instructions (Signed)
Tdap shot today   Wash wound with soap and water  Cover if needed  Antibiotic ointment is ok  Take doxycycline as directed   Watch for redness/swelling/pain -worse or drainage

## 2020-09-20 NOTE — Assessment & Plan Note (Signed)
From dog bite No active bleeding  inst to keep clean  Tdap vaccine updated

## 2020-09-20 NOTE — Assessment & Plan Note (Signed)
R arm in antecubital area -does not appear infected  inst to keep clean with soap and water  Cover if needed Px doxycycline 100 mg bid for 7d (pt cannot take augmentin) inst to call if any inc in pain/redness/swelling or any drainage Update if not starting to improve in a week or if worsening   Tdap given

## 2020-09-20 NOTE — Progress Notes (Signed)
Subjective:    Patient ID: Mary Gordon, female    DOB: 01/08/1946, 74 y.o.   MRN: [id number]  This visit occurred during the SARS-CoV-2 public health emergency.  Safety protocols were in place, including screening questions prior to the visit, additional usage of staff PPE, and extensive cleaning of exam room while observing appropriate contact time as indicated for disinfecting solutions.    HPI Pt presents with c/o a dog bite   Wt Readings from Last 3 Encounters:  09/20/20 165 lb 6 oz (75 kg)  09/07/20 164 lb 6 oz (74.6 kg)  05/08/20 165 lb 8 oz (75.1 kg)   Her dog Fully immunized   R inner arm- antecubital area  Not a lot of bleeding  Happened Tuesday night  Bruised  Not a lot of redness  Very slt sore and swollen  occ seeped fluid-that stopped   No pus  Feeling ok   Washing and neosporin    Td was 9/10  She is around neighbor's newborn   Patient Active Problem List   Diagnosis Date Noted  . Dog bite 09/20/2020  . Laceration of arm 09/20/2020  . Estrogen deficiency 09/07/2020  . Sprain of ankle 06/09/2018  . Laryngopharyngeal reflux (LPR) 01/08/2017  . Pharyngeal dysphagia 01/08/2017  . Prediabetes 08/25/2016  . Routine general medical examination at a health care facility 08/19/2016  . Stress reaction 09/17/2015  . 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 Rosacea 12/27/2014  . Pulmonary HTN ([place]) 10/20/2014  . DOE (dyspnea on exertion) 09/26/2014  . PVCs (premature ventricular contractions) 09/26/2014  . Encounter for Medicare annual wellness exam 03/18/2014  . Fibromyalgia 05/31/2012  . Special screening for malignant neoplasms, colon 09/26/2011  . GERD (gastroesophageal reflux disease) 07/08/2011  . CERVICAL RADICULOPATHY, RIGHT 09/04/2010  . PULMONARY NODULE 09/14/2009  . PALPITATIONS 08/17/2009  . Vitamin D deficiency 02/22/2009  . HYPERCHOLESTEROLEMIA 02/22/2009  . Adjustment disorder with mixed anxiety and depressed mood 02/22/2009  .  ALLERGIC RHINITIS 02/22/2009  . MENOPAUSAL SYNDROME 02/22/2009  . Osteopenia 02/22/2009   Past Medical History:  Diagnosis Date  . Allergy    all year  . Anxiety   . Bursitis of hip   . Cataract   . Cervical cancer ([place]) 1978  . Chronic kidney disease    RENAL INSUFF  . Depression   . Dysrhythmia    PVC'S  . Fibromyalgia   . GERD (gastroesophageal reflux disease)   . High cholesterol   . History of Clostridium difficile infection    In past from augmentin  . History of hiatal hernia   . Osteopenia   . Palpitations   . Panic disorder   . Pulmonary HTN ([place]) 10/20/2014   moderate with PASP 69mmHg  . Pulmonary nodule    Being observed  . Vitamin D deficiency    Past Surgical History:  Procedure Laterality Date  . ABDOMINAL HYSTERECTOMY  1978  . CARDIAC CATHETERIZATION    . CATARACT EXTRACTION W/PHACO Right 07/14/2017   Procedure: CATARACT EXTRACTION PHACO AND INTRAOCULAR LENS PLACEMENT (IOC);  Surgeon: Birder Robson, MD;  Location: [facility] ORS;  Service: Ophthalmology;  Laterality: Right;  [place] 00:31 AP% 15.7 CDE 4.97 Fluid pack lot # [id number]  . CATARACT EXTRACTION W/PHACO Left 08/04/2017   Procedure: CATARACT EXTRACTION PHACO AND INTRAOCULAR LENS PLACEMENT (IOC);  Surgeon: Birder Robson, MD;  Location: [facility] ORS;  Service: Ophthalmology;  Laterality: Left;  [place] 00:34 AP% 18.7 CDE 6.42 Fluid pack lot # [id number]  . COLONOSCOPY  2002  Normal  . HAND SURGERY  2005   Left hand thrombosis  . NASAL HEMORRHAGE CONTROL  2007  . RIGHT HEART CATHETERIZATION N/A 03/16/2015   Procedure: RIGHT HEART CATH;  Surgeon: Larey Dresser, MD;  Location: [facility] CATH LAB;  Service: Cardiovascular;  Laterality: N/A;  . SHOULDER SURGERY  2008   Right shoulder bone spur  . THYROGLOSSAL DUCT CYST     Social History   Tobacco Use  . Smoking status: Former Smoker    Packs/day: 1.00    Years: 17.00    Pack years: 17.00    Types: Cigarettes    Quit date: 12/08/1978    Years since quitting:  41.8  . Smokeless tobacco: Never Used  Vaping Use  . Vaping Use: Never used  Substance Use Topics  . Alcohol use: No    Alcohol/week: 0.0 standard drinks  . Drug use: No   Family History  Problem Relation Age of Onset  . Heart attack Mother 80  . Emphysema Mother   . Allergies Mother   . Asthma Mother   . Allergies Father   . Glaucoma Father   . Arrhythmia Sister        Atrial fibrillation  . Cancer Sister        breast  . Breast cancer Sister   . Emphysema Sister   . Heart disease Sister   . Rheumatologic disease Sister   . Lung cancer Sister   . Diabetes Other        Parent  . Hyperlipidemia Other        Other relative  . Hypertension Other        Parent, other relative  . Breast cancer Other        Other relative, sister  . Allergies Other        Siblings  . Asthma Other        Sisters  . Colon cancer Neg Hx   . Esophageal cancer Neg Hx   . Rectal cancer Neg Hx   . Stomach cancer Neg Hx    Allergies  Allergen Reactions  . Adhesive [Tape] Rash and Other (See Comments)    "Burns" the skin  . Amoxicillin-Pot Clavulanate Other (See Comments)    Resulted in C-DIFF!!!!  . Macrobid [Nitrofurantoin] Diarrhea    Severe abdominal cramping  . Simvastatin Other (See Comments)    Caused severe muscle pain   Current Outpatient Medications on File Prior to Visit  Medication Sig Dispense Refill  . chlorpheniramine (CHLOR-TRIMETON) 4 MG tablet Take 8 mg by mouth 2 (two) times daily.     . Cholecalciferol (VITAMIN D3) 10000 units capsule Take 10,000 Units by mouth daily.    . famotidine (PEPCID) 20 MG tablet Take 1 tablet (20 mg total) by mouth 2 (two) times daily. 180 tablet 3  . isosorbide mononitrate (IMDUR) 60 MG 24 hr tablet TAKE 1 TABLET (60 MG TOTAL) BY MOUTH DAILY. 90 tablet 3  . metoprolol succinate (TOPROL-XL) 25 MG 24 hr tablet Take 1 tablet (25 mg total) by mouth daily. 90 tablet 3  . metroNIDAZOLE (METROCREAM) 0.75 % cream Apply topically 2 (two) times daily  as needed. To affected facial areas for rosacea (Patient taking differently: Apply 1 application topically 2 (two) times daily as needed (to affected facial areas for rosacea). ) 45 g 3  . OVER THE COUNTER MEDICATION Take 5-7 mLs by mouth See admin instructions. Life Tones (uric acid cleanser- has celery extract and other herbs) liquid: Take  5-7 ml's by mouth once a day    . pantoprazole (PROTONIX) 40 MG tablet Take 1 tablet (40 mg total) by mouth daily as needed (for reflux). 90 tablet 3  . rosuvastatin (CRESTOR) 5 MG tablet Take one pill by mouth twice weekly on Monday and Thursday 8 tablet 11  . sertraline (ZOLOFT) 50 MG tablet Take 1 tablet (50 mg total) by mouth daily. 90 tablet 3  . traZODone (DESYREL) 100 MG tablet Take 1 tablet (100 mg total) by mouth at bedtime. 90 tablet 3   No current facility-administered medications on file prior to visit.    Review of Systems  Constitutional: Negative for activity change, appetite change, fatigue, fever and unexpected weight change.  HENT: Negative for congestion, ear pain, rhinorrhea, sinus pressure and sore throat.   Eyes: Negative for pain, redness and visual disturbance.  Respiratory: Negative for cough, shortness of breath and wheezing.   Cardiovascular: Negative for chest pain and palpitations.  Gastrointestinal: Negative for abdominal pain, blood in stool, constipation and diarrhea.  Endocrine: Negative for polydipsia and polyuria.  Genitourinary: Negative for dysuria, frequency and urgency.  Musculoskeletal: Negative for arthralgias, back pain and myalgias.  Skin: Positive for wound. Negative for pallor and rash.  Allergic/Immunologic: Negative for environmental allergies.  Neurological: Negative for dizziness, syncope and headaches.  Hematological: Negative for adenopathy. Does not bruise/bleed easily.  Psychiatric/Behavioral: Negative for decreased concentration and dysphoric mood. The patient is not nervous/anxious.          Objective:   Physical Exam Constitutional:      General: She is not in acute distress.    Appearance: Normal appearance. She is normal weight. She is not ill-appearing.  HENT:     Head: Normocephalic and atraumatic.  Eyes:     Conjunctiva/sclera: Conjunctivae normal.     Pupils: Pupils are equal, round, and reactive to light.  Cardiovascular:     Rate and Rhythm: Normal rate and regular rhythm.  Skin:    Findings: Bruising present.     Comments: Wound comprised of 5 tiny puncture marks in a circle on R arm in antecubital area with bruising  Very slight swelling , no warmth or erythema  Mildly tender  Neurological:     Mental Status: She is alert.     Sensory: No sensory deficit.     Coordination: Coordination normal.  Psychiatric:        Mood and Affect: Mood normal.           Assessment & Plan:   Problem List Items Addressed This Visit      Other   Dog bite - Primary    R arm in antecubital area -does not appear infected  inst to keep clean with soap and water  Cover if needed Px doxycycline 100 mg bid for 7d (pt cannot take augmentin) inst to call if any inc in pain/redness/swelling or any drainage Update if not starting to improve in a week or if worsening   Tdap given      Relevant Orders   Tdap vaccine greater than or equal to 7yo IM (Completed)   Laceration of arm    From dog bite No active bleeding  inst to keep clean  Tdap vaccine updated      Relevant Orders   Tdap vaccine greater than or equal to 7yo IM (Completed)

## 2020-10-16 DIAGNOSIS — D225 Melanocytic nevi of trunk: Secondary | ICD-10-CM | POA: Diagnosis not present

## 2020-10-16 DIAGNOSIS — L718 Other rosacea: Secondary | ICD-10-CM | POA: Diagnosis not present

## 2020-10-16 DIAGNOSIS — L821 Other seborrheic keratosis: Secondary | ICD-10-CM | POA: Diagnosis not present

## 2020-10-16 DIAGNOSIS — D1801 Hemangioma of skin and subcutaneous tissue: Secondary | ICD-10-CM | POA: Diagnosis not present

## 2020-10-16 DIAGNOSIS — L814 Other melanin hyperpigmentation: Secondary | ICD-10-CM | POA: Diagnosis not present

## 2020-10-16 DIAGNOSIS — L570 Actinic keratosis: Secondary | ICD-10-CM | POA: Diagnosis not present

## 2020-10-16 DIAGNOSIS — L820 Inflamed seborrheic keratosis: Secondary | ICD-10-CM | POA: Diagnosis not present

## 2020-10-30 ENCOUNTER — Other Ambulatory Visit: Payer: Self-pay

## 2020-10-30 ENCOUNTER — Other Ambulatory Visit (INDEPENDENT_AMBULATORY_CARE_PROVIDER_SITE_OTHER): Payer: PPO

## 2020-10-30 DIAGNOSIS — E7800 Pure hypercholesterolemia, unspecified: Secondary | ICD-10-CM | POA: Diagnosis not present

## 2020-10-30 LAB — LIPID PANEL
Cholesterol: 182 mg/dL (ref 0–200)
HDL: 59.9 mg/dL (ref >39.00)
LDL Cholesterol: 98 mg/dL (ref 0–99)
NonHDL: 122.36
Total CHOL/HDL Ratio: 3
Triglycerides: 123 mg/dL (ref 0.0–149.0)
VLDL: 24.6 mg/dL (ref 0.0–40.0)

## 2020-10-30 LAB — ALT: ALT: 18 U/L (ref 0–35)

## 2020-10-30 LAB — AST: AST: 21 U/L (ref 0–37)

## 2020-12-08 DIAGNOSIS — C4492 Squamous cell carcinoma of skin, unspecified: Secondary | ICD-10-CM

## 2020-12-08 HISTORY — DX: Squamous cell carcinoma of skin, unspecified: C44.92

## 2020-12-26 ENCOUNTER — Other Ambulatory Visit: Payer: Self-pay | Admitting: Family Medicine

## 2020-12-26 DIAGNOSIS — E2839 Other primary ovarian failure: Secondary | ICD-10-CM

## 2020-12-27 ENCOUNTER — Other Ambulatory Visit: Payer: PPO

## 2021-03-13 DIAGNOSIS — H26491 Other secondary cataract, right eye: Secondary | ICD-10-CM | POA: Diagnosis not present

## 2021-04-23 ENCOUNTER — Other Ambulatory Visit: Payer: Self-pay

## 2021-04-23 ENCOUNTER — Ambulatory Visit
Admission: RE | Admit: 2021-04-23 | Discharge: 2021-04-23 | Disposition: A | Discharge status: Home / Self Care | Payer: PPO | Source: Ambulatory Visit | Attending: Family Medicine | Admitting: Family Medicine

## 2021-04-23 DIAGNOSIS — M85851 Other specified disorders of bone density and structure, right thigh: Secondary | ICD-10-CM | POA: Diagnosis not present

## 2021-04-23 DIAGNOSIS — Z780 Asymptomatic menopausal state: Secondary | ICD-10-CM | POA: Diagnosis not present

## 2021-04-23 DIAGNOSIS — E2839 Other primary ovarian failure: Secondary | ICD-10-CM

## 2021-04-24 ENCOUNTER — Encounter: Payer: Self-pay | Admitting: Family Medicine

## 2021-05-02 ENCOUNTER — Ambulatory Visit (INDEPENDENT_AMBULATORY_CARE_PROVIDER_SITE_OTHER): Payer: PPO | Admitting: Family Medicine

## 2021-05-02 ENCOUNTER — Encounter: Payer: Self-pay | Admitting: Family Medicine

## 2021-05-02 ENCOUNTER — Ambulatory Visit (INDEPENDENT_AMBULATORY_CARE_PROVIDER_SITE_OTHER)
Admission: RE | Admit: 2021-05-02 | Discharge: 2021-05-02 | Disposition: A | Discharge status: Home / Self Care | Payer: PPO | Source: Ambulatory Visit | Attending: Family Medicine | Admitting: Family Medicine

## 2021-05-02 ENCOUNTER — Other Ambulatory Visit: Payer: Self-pay

## 2021-05-02 VITALS — BP 114/68 | HR 72 | Temp 97.0°F | Ht 63.75 in | Wt 158.5 lb

## 2021-05-02 DIAGNOSIS — M25562 Pain in left knee: Secondary | ICD-10-CM

## 2021-05-02 DIAGNOSIS — R519 Headache, unspecified: Secondary | ICD-10-CM | POA: Diagnosis not present

## 2021-05-02 LAB — CBC WITH DIFFERENTIAL/PLATELET
Basophils Absolute: 0 10*3/uL (ref 0.0–0.1)
Basophils Relative: 0.7 % (ref 0.0–3.0)
Eosinophils Absolute: 0.3 10*3/uL (ref 0.0–0.7)
Eosinophils Relative: 4 % (ref 0.0–5.0)
HCT: 40.8 % (ref 36.0–46.0)
Hemoglobin: 13.3 g/dL (ref 12.0–15.0)
Lymphocytes Relative: 29.6 % (ref 12.0–46.0)
Lymphs Abs: 1.8 10*3/uL (ref 0.7–4.0)
MCHC: 32.6 g/dL (ref 30.0–36.0)
MCV: 85.4 fl (ref 78.0–100.0)
Monocytes Absolute: 0.5 10*3/uL (ref 0.1–1.0)
Monocytes Relative: 7.8 % (ref 3.0–12.0)
Neutro Abs: 3.6 10*3/uL (ref 1.4–7.7)
Neutrophils Relative %: 57.9 % (ref 43.0–77.0)
Platelets: 286 10*3/uL (ref 150.0–400.0)
RBC: 4.78 Mil/uL (ref 3.87–5.11)
RDW: 13.9 % (ref 11.5–15.5)
WBC: 6.3 10*3/uL (ref 4.0–10.5)

## 2021-05-02 LAB — C-REACTIVE PROTEIN: CRP: <1 mg/dL (ref 0.5–20.0)

## 2021-05-02 LAB — SEDIMENTATION RATE: Sed Rate: 16 mm/hr (ref 0–30)

## 2021-05-02 NOTE — Progress Notes (Signed)
Subjective:    Patient ID: Mary Gordon, female    DOB: [birth year], 75 y.o.   MRN: [id number]  This visit occurred during the SARS-CoV-2 public health emergency.  Safety protocols were in place, including screening questions prior to the visit, additional usage of staff PPE, and extensive cleaning of exam room while observing appropriate contact time as indicated for disinfecting solutions.    HPI Pt presents with HA and L knee pain   Wt Readings from Last 3 Encounters:  05/02/21 158 lb 8 oz (71.9 kg)  09/20/20 165 lb 6 oz (75 kg)  09/07/20 164 lb 6 oz (74.6 kg)   27.42 kg/m  2 1/2 weeks of headaches (new)  Right now dull ache L side  At times worse  Monday she played pickle ball- did not play well and felt disoriented  Thought eyesight was off  Had trouble using her phone  The next day she felt fine  Does not think she was dehydrated or low on sugar   One of her friends told her they had symptoms of that with an iron deficiency   No speech trouble No facial droop  No weakness or numbness   No sinus or resp symptoms or covid  No urinary symptoms      Last xray of L knee 2015  DG Knee Complete 4 Views Left (Accession [id number]) (Order [id number]) Imaging Date: 08/18/2014 Department: [facility] Released By: Ellamae Sia Authorizing: Sadako Cegielski, Wynelle Fanny, MD    Exam Status  Status  Final [99]   PACS Intelerad Image Link  Show images for DG Knee Complete 4 Views Left  Study Result  Narrative  CLINICAL DATA: Knee pain.   EXAM:  LEFT KNEE - COMPLETE 4+ VIEW   COMPARISON: None.   FINDINGS:  Soft tissue structures are unremarkable. No evidence of effusion. No  evidence of fracture or dislocation.   IMPRESSION:  No acute or focal abnormality identified.    3 mo ago-fell playing pickle ball and fell on lateral side of L knee Used a brace  Normal range of movement Walking /twisting  Able to play ball with brace  Used  muscle rub No swelling  No pain medicine   BP Readings from Last 3 Encounters:  05/02/21 114/68  09/20/20 116/70  09/07/20 118/68   Pulse Readings from Last 3 Encounters:  05/02/21 72  09/20/20 63  09/07/20 81   Patient Active Problem List   Diagnosis Date Noted  . Headache 05/02/2021  . Dog bite 09/20/2020  . Laceration of arm 09/20/2020  . Estrogen deficiency 09/07/2020  . Sprain of ankle 06/09/2018  . Laryngopharyngeal reflux (LPR) 01/08/2017  . Pharyngeal dysphagia 01/08/2017  . Prediabetes 08/25/2016  . Routine general medical examination at a health care facility 08/19/2016  . Stress reaction 09/17/2015  . 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 Rosacea 12/27/2014  . Pulmonary HTN ([place]) 10/20/2014  . DOE (dyspnea on exertion) 09/26/2014  . PVCs (premature ventricular contractions) 09/26/2014  . Left knee pain 08/18/2014  . Encounter for Medicare annual wellness exam 03/18/2014  . Fibromyalgia 05/31/2012  . Special screening for malignant neoplasms, colon 09/26/2011  . GERD (gastroesophageal reflux disease) 07/08/2011  . CERVICAL RADICULOPATHY, RIGHT 09/04/2010  . PULMONARY NODULE 09/14/2009  . PALPITATIONS 08/17/2009  . Vitamin D deficiency 02/22/2009  . HYPERCHOLESTEROLEMIA 02/22/2009  . Adjustment disorder with mixed anxiety and depressed mood 02/22/2009  . ALLERGIC RHINITIS 02/22/2009  . MENOPAUSAL SYNDROME 02/22/2009  . Osteopenia  02/22/2009   Past Medical History:  Diagnosis Date  . Allergy    all year  . Anxiety   . Bursitis of hip   . Cataract   . Cervical cancer ([place]) 1978  . Chronic kidney disease    RENAL INSUFF  . Depression   . Dysrhythmia    PVC'S  . Fibromyalgia   . GERD (gastroesophageal reflux disease)   . High cholesterol   . History of Clostridium difficile infection    In past from augmentin  . History of hiatal hernia   . Osteopenia   . Palpitations   . Panic disorder   . Pulmonary HTN ([place]) 10/20/2014   moderate with PASP  63mHg  . Pulmonary nodule    Being observed  . Vitamin D deficiency    Past Surgical History:  Procedure Laterality Date  . ABDOMINAL HYSTERECTOMY  1978  . CARDIAC CATHETERIZATION    . CATARACT EXTRACTION W/PHACO Right 07/14/2017   Procedure: CATARACT EXTRACTION PHACO AND INTRAOCULAR LENS PLACEMENT (IOC);  Surgeon: PBirder Robson MD;  Location: [facility] ORS;  Service: Ophthalmology;  Laterality: Right;  [place] AP% 15.7 CDE 4.97 Fluid pack lot # [id number]  . CATARACT EXTRACTION W/PHACO Left 08/04/2017   Procedure: CATARACT EXTRACTION PHACO AND INTRAOCULAR LENS PLACEMENT (IOC);  Surgeon: PBirder Robson MD;  Location: [facility] ORS;  Service: Ophthalmology;  Laterality: Left;  [place]:34 AP% 18.7 CDE 6.42 Fluid pack lot # [id number]  . COLONOSCOPY  2002   Normal  . HAND SURGERY  2005   Left hand thrombosis  . NASAL HEMORRHAGE CONTROL  2007  . RIGHT HEART CATHETERIZATION N/A 03/16/2015   Procedure: RIGHT HEART CATH;  Surgeon: DLarey Dresser MD;  [facility];  Service: Cardiovascular;  Laterality: N/A;  . SHOULDER SURGERY  2008   Right shoulder bone spur  . THYROGLOSSAL DUCT CYST     Social History   Tobacco Use  . Smoking status: Former Smoker    Packs/day: 1.00    Years: 17.00    Pack years: 17.00    Types: Cigarettes    Quit date: 12/08/1978    Years since quitting: 42.4  . Smokeless tobacco: Never Used  Vaping Use  . Vaping Use: Never used  Substance Use Topics  . Alcohol use: No    Alcohol/week: 0.0 standard drinks  . Drug use: No   Family History  Problem Relation Age of Onset  . Heart attack Mother 633 . Emphysema Mother   . Allergies Mother   . Asthma Mother   . Allergies Father   . Glaucoma Father   . Arrhythmia Sister        Atrial fibrillation  . Cancer Sister        breast  . Breast cancer Sister   . Emphysema Sister   . Heart disease Sister   . Rheumatologic disease Sister   . Lung cancer Sister   . Diabetes Other        Parent  .  Hyperlipidemia Other        Other relative  . Hypertension Other        Parent, other relative  . Breast cancer Other        Other relative, sister  . Allergies Other        Siblings  . Asthma Other        Sisters  . Colon cancer Neg Hx   . Esophageal cancer Neg Hx   . Rectal cancer Neg Hx   .  Stomach cancer Neg Hx    Allergies  Allergen Reactions  . Adhesive [Tape] Rash and Other (See Comments)    "Burns" the skin  . Amoxicillin-Pot Clavulanate Other (See Comments)    Resulted in C-DIFF!!!!  . Macrobid [Nitrofurantoin] Diarrhea    Severe abdominal cramping  . Simvastatin Other (See Comments)    Caused severe muscle pain   Current Outpatient Medications on File Prior to Visit  Medication Sig Dispense Refill  . chlorpheniramine (CHLOR-TRIMETON) 4 MG tablet Take 8 mg by mouth 2 (two) times daily.    . Cholecalciferol (VITAMIN D3) 10000 units capsule Take 10,000 Units by mouth daily.    . famotidine (PEPCID) 20 MG tablet Take 1 tablet (20 mg total) by mouth 2 (two) times daily. 180 tablet 3  . isosorbide mononitrate (IMDUR) 60 MG 24 hr tablet TAKE 1 TABLET (60 MG TOTAL) BY MOUTH DAILY. 90 tablet 3  . metoprolol succinate (TOPROL-XL) 25 MG 24 hr tablet Take 1 tablet (25 mg total) by mouth daily. 90 tablet 3  . metroNIDAZOLE (METROCREAM) 0.75 % cream Apply topically 2 (two) times daily as needed. To affected facial areas for rosacea (Patient taking differently: Apply 1 application topically 2 (two) times daily as needed (to affected facial areas for rosacea).) 45 g 3  . OVER THE COUNTER MEDICATION Take 5-7 mLs by mouth See admin instructions. Life Tones (uric acid cleanser- has celery extract and other herbs) liquid: Take 5-7 ml's by mouth once a day    . rosuvastatin (CRESTOR) 5 MG tablet Take one pill by mouth twice weekly on Monday and Thursday 8 tablet 11  . sertraline (ZOLOFT) 50 MG tablet Take 1 tablet (50 mg total) by mouth daily. 90 tablet 3  . traZODone (DESYREL) 100 MG  tablet Take 1 tablet (100 mg total) by mouth at bedtime. 90 tablet 3   No current facility-administered medications on file prior to visit.     Review of Systems  Constitutional: Positive for fatigue. Negative for activity change, appetite change, fever and unexpected weight change.  HENT: Negative for congestion, ear pain, rhinorrhea, sinus pressure and sore throat.   Eyes: Negative for pain, redness and visual disturbance.  Respiratory: Negative for cough, shortness of breath and wheezing.   Cardiovascular: Negative for chest pain, palpitations and leg swelling.  Gastrointestinal: Negative for abdominal pain, blood in stool, constipation and diarrhea.  Endocrine: Negative for polydipsia and polyuria.  Genitourinary: Negative for dysuria, frequency and urgency.  Musculoskeletal: Positive for arthralgias. Negative for back pain and myalgias.  Skin: Negative for pallor and rash.  Allergic/Immunologic: Negative for environmental allergies.  Neurological: Positive for headaches. Negative for dizziness, tremors, seizures, syncope, facial asymmetry, speech difficulty, weakness, light-headedness and numbness.  Hematological: Negative for adenopathy. Does not bruise/bleed easily.  Psychiatric/Behavioral: Negative for decreased concentration and dysphoric mood. The patient is not nervous/anxious.        Objective:   Physical Exam Constitutional:      General: She is not in acute distress.    Appearance: She is well-developed and normal weight. She is not ill-appearing, toxic-appearing or diaphoretic.  HENT:     Head: Normocephalic and atraumatic.     Comments: Very mild tenderness of L temple   No TMJ tenderness    Right Ear: External ear normal.     Left Ear: External ear normal.     Nose: Nose normal.     Mouth/Throat:     Mouth: Mucous membranes are moist.     Pharynx:  No oropharyngeal exudate.  Eyes:     General: No scleral icterus.       Right eye: No discharge.        Left  eye: No discharge.     Extraocular Movements: Extraocular movements intact.     Conjunctiva/sclera: Conjunctivae normal.     Pupils: Pupils are equal, round, and reactive to light.     Comments: No nystagmus   Neck:     Thyroid: No thyromegaly.     Vascular: No carotid bruit or JVD.     Trachea: No tracheal deviation.  Cardiovascular:     Rate and Rhythm: Normal rate and regular rhythm.     Heart sounds: Normal heart sounds. No murmur heard.   Pulmonary:     Effort: Pulmonary effort is normal. No respiratory distress.     Breath sounds: Normal breath sounds. No wheezing or rales.  Abdominal:     General: Bowel sounds are normal. There is no distension.     Palpations: Abdomen is soft. There is no mass.     Tenderness: There is no abdominal tenderness.  Musculoskeletal:     Cervical back: Full passive range of motion without pain, normal range of motion and neck supple.     Left knee: No swelling, deformity, effusion, erythema, ecchymosis or crepitus. Normal range of motion. Tenderness present over the medial joint line. Normal alignment, normal meniscus and normal patellar mobility. Normal pulse.     Comments: Neg mc murray test for L knee   Lymphadenopathy:     Cervical: No cervical adenopathy.  Skin:    General: Skin is warm and dry.     Coloration: Skin is not pale.     Findings: No rash.  Neurological:     Mental Status: She is alert and oriented to person, place, and time.     Cranial Nerves: Cranial nerves are intact. No cranial nerve deficit, dysarthria or facial asymmetry.     Sensory: Sensation is intact. No sensory deficit.     Motor: Motor function is intact. No tremor, atrophy, abnormal muscle tone or pronator drift.     Coordination: Coordination is intact. Romberg sign negative. Coordination normal.     Gait: Gait is intact. Gait normal.     Deep Tendon Reflexes: Reflexes are normal and symmetric.     Comments: No focal cerebellar signs   Psychiatric:         Mood and Affect: Mood normal.        Behavior: Behavior normal.        Thought Content: Thought content normal.           Assessment & Plan:   Problem List Items Addressed This Visit      Other   Left knee pain    Since a fall on L side 3 wk ago  Pain was lateral and now medial  Joint line pain on exam  xr ordered   OA is in the differential  Reassuring exam  Recommended voltaren gel prn, ice/brace when needed        Relevant Orders   DG Knee 4 Views W/Patella Left   Headache - Primary    New L sided headache in 75 yo (concern re: age) Some L temple tenderness  A day of fuzzy cognition (resolved) Reassuring exam  CT head ordered w/o contrast  CRP and ESR ordered to r/o temporal arteritis  inst that if HA becomes severe or she dev any neuro symptoms to go  to ER (911 if needed)      Relevant Orders   CBC with Differential/Platelet (Completed)   Sedimentation Rate (Completed)   C-reactive protein (Completed)   CT Head Wo Contrast

## 2021-05-02 NOTE — Patient Instructions (Signed)
Labs  Xray  We will call you about a CT scan  If symptoms worsen let us know  If severe go to the ER  Use voltaren gel over the counter on knee up to 4 times per day  Brace if needed Ice when able

## 2021-05-02 NOTE — Assessment & Plan Note (Signed)
Since a fall on L side 3 wk ago  Pain was lateral and now medial  Joint line pain on exam  xr ordered   OA is in the differential  Reassuring exam  Recommended voltaren gel prn, ice/brace when needed

## 2021-05-02 NOTE — Assessment & Plan Note (Signed)
New L sided headache in 75 yo (concern re: age) Some L temple tenderness  A day of fuzzy cognition (resolved) Reassuring exam  CT head ordered w/o contrast  CRP and ESR ordered to r/o temporal arteritis  inst that if HA becomes severe or she dev any neuro symptoms to go to ER (911 if needed)

## 2021-05-08 ENCOUNTER — Ambulatory Visit
Admission: RE | Admit: 2021-05-08 | Discharge: 2021-05-08 | Disposition: A | Discharge status: Home / Self Care | Payer: PPO | Source: Ambulatory Visit | Attending: Family Medicine | Admitting: Family Medicine

## 2021-05-08 ENCOUNTER — Other Ambulatory Visit: Payer: Self-pay

## 2021-05-08 DIAGNOSIS — R410 Disorientation, unspecified: Secondary | ICD-10-CM | POA: Diagnosis not present

## 2021-05-08 DIAGNOSIS — R519 Headache, unspecified: Secondary | ICD-10-CM | POA: Diagnosis not present

## 2021-05-10 ENCOUNTER — Encounter: Payer: Self-pay | Admitting: Neurology

## 2021-05-10 ENCOUNTER — Telehealth: Payer: Self-pay | Admitting: Family Medicine

## 2021-05-10 DIAGNOSIS — R519 Headache, unspecified: Secondary | ICD-10-CM

## 2021-05-10 NOTE — Telephone Encounter (Signed)
I put in an urgent referral.

## 2021-05-10 NOTE — Telephone Encounter (Signed)
-----   Message from Ronna Polio, RN sent at 05/10/2021  2:34 PM EDT ----- Patient stated that she prefers the [place] area.

## 2021-06-24 NOTE — Progress Notes (Signed)
NEUROLOGY CONSULTATION NOTE  Mary Gordon MRN: [id number] DOB: 11-Jun-1946  Referring provider: Loura Pardon, MD Primary care provider: Loura Pardon, MD  Reason for consult:  headaches  Assessment/Plan:   New onset persistent headache Transient confusion - consider migraine aura (in setting of new onset migraines) vs atypical seizure vs TIA  1.Check MRI of brain with and without contrast.  Would also check MRA of head to rule out cerebral aneurysm or other vascular abnormality 2 Check EEG 3 Follow up after testing   Subjective:  Mary Gordon is a 75 year old female with pulmonary HTN, CKD, high cholesterol, depression/anxiety and fibromyalgia who presents for headaches.  History supplemented by referring provider's note.  She started having near persistent headaches in the beginning of May.  She describes an intense left sided headache involving the eye and parietal/temporal region.  For the first 3 days, her left eye was swollen, painful and watery.  Maybe a little photophobia but no nausea, vomiting, phonophobia, visual disturbance, numbness or weakness.  She denies prior history of headaches and migraines.  On May 23, she had an episode of confusion.  She was looking at her phone and realized that she didn't know how to operate it.  She didn't know what all of the icons meant.  Her head felt a little hazy but it was not associated with increased severity of her headache.  She drove to the movies without any difficulty.  She came home and then next morning, she felt normal and understood how to operate her phone.  She followed up with her PCP.  Sed rate and CRP were 16 and < 1.0 respectively.  CT head on 05/09/2021 personally reviewed and was normal.  The headaches lasted about a month.  Her last headache was June 6.  The left side of her head feels a little tender but nothing significant.   PAST MEDICAL HISTORY: Past Medical History:  Diagnosis Date   Allergy    all year    Anxiety    Bursitis of hip    Cataract    Cervical cancer ([place]) 1978   Chronic kidney disease    RENAL INSUFF   Depression    Dysrhythmia    PVC'S   Fibromyalgia    GERD (gastroesophageal reflux disease)    High cholesterol    History of Clostridium difficile infection    In past from augmentin   History of hiatal hernia    Osteopenia    Palpitations    Panic disorder    Pulmonary HTN ([place]) 10/20/2014   moderate with PASP 62mmHg   Pulmonary nodule    Being observed   Vitamin D deficiency     PAST SURGICAL HISTORY: Past Surgical History:  Procedure Laterality Date   ABDOMINAL HYSTERECTOMY  1978   CARDIAC CATHETERIZATION     CATARACT EXTRACTION W/PHACO Right 07/14/2017   Procedure: CATARACT EXTRACTION PHACO AND INTRAOCULAR LENS PLACEMENT ([place]);  Surgeon: Birder Robson, MD;  Location: [facility] ORS;  Service: Ophthalmology;  Laterality: Right;  [place] 00:31 AP% 15.7 CDE 4.97 Fluid pack lot # [id number]   CATARACT EXTRACTION W/PHACO Left 08/04/2017   Procedure: CATARACT EXTRACTION PHACO AND INTRAOCULAR LENS PLACEMENT ([facility]);  Surgeon: Birder Robson, MD;  Location: [facility] ORS;  Service: Ophthalmology;  Laterality: Left;  [place] 00:34 AP% 18.7 CDE 6.42 Fluid pack lot # [id number]   COLONOSCOPY  2002   Normal   HAND SURGERY  2005   Left hand thrombosis   NASAL HEMORRHAGE CONTROL  2007   RIGHT HEART CATHETERIZATION N/A 03/16/2015   Procedure: RIGHT HEART CATH;  Surgeon: Larey Dresser, MD;  Location: [facility] C[facility] LAB;  Service: Cardiovascular;  Laterality: N/A;   SHOULDER SURGERY  2008   Right shoulder bone spur   THYROGLOSSAL DUCT CYST      MEDICATIONS: Current Outpatient Medications on File Prior to Visit  Medication Sig Dispense Refill   chlorpheniramine (CHLOR-TRIMETON) 4 MG tablet Take 8 mg by mouth 2 (two) times daily.     Cholecalciferol (VITAMIN D3) 10000 units capsule Take 10,000 Units by mouth daily.     famotidine (PEPCID) 20 MG tablet Take 1 tablet (20 mg total) by mouth 2  (two) times daily. 180 tablet 3   isosorbide mononitrate (IMDUR) 60 MG 24 hr tablet TAKE 1 TABLET (60 MG TOTAL) BY MOUTH DAILY. 90 tablet 3   metoprolol succinate (TOPROL-XL) 25 MG 24 hr tablet Take 1 tablet (25 mg total) by mouth daily. 90 tablet 3   metroNIDAZOLE (METROCREAM) 0.75 % cream Apply topically 2 (two) times daily as needed. To affected facial areas for rosacea (Patient taking differently: Apply 1 application topically 2 (two) times daily as needed (to affected facial areas for rosacea).) 45 g 3   OVER THE COUNTER MEDICATION Take 5-7 mLs by mouth See admin instructions. Life Tones (uric acid cleanser- has celery extract and other herbs) liquid: Take 5-7 ml's by mouth once a day     rosuvastatin (CRESTOR) 5 MG tablet Take one pill by mouth twice weekly on Monday and Thursday 8 tablet 11   sertraline (ZOLOFT) 50 MG tablet Take 1 tablet (50 mg total) by mouth daily. 90 tablet 3   traZODone (DESYREL) 100 MG tablet Take 1 tablet (100 mg total) by mouth at bedtime. 90 tablet 3   No current facility-administered medications on file prior to visit.    ALLERGIES: Allergies  Allergen Reactions   Adhesive [Tape] Rash and Other (See Comments)    "Burns" the skin   Amoxicillin-Pot Clavulanate Other (See Comments)    Resulted in C-DIFF!!!!   Macrobid [Nitrofurantoin] Diarrhea    Severe abdominal cramping   Simvastatin Other (See Comments)    Caused severe muscle pain    FAMILY HISTORY: Family History  Problem Relation Age of Onset   Heart attack Mother 38   Emphysema Mother    Allergies Mother    Asthma Mother    Allergies Father    Glaucoma Father    Arrhythmia Sister        Atrial fibrillation   Cancer Sister        breast   Breast cancer Sister    Emphysema Sister    Heart disease Sister    Rheumatologic disease Sister    Lung cancer Sister    Diabetes Other        Parent   Hyperlipidemia Other        Other relative   Hypertension Other        Parent, other relative    Breast cancer Other        Other relative, sister   Allergies Other        Siblings   Asthma Other        Sisters   Colon cancer Neg Hx    Esophageal cancer Neg Hx    Rectal cancer Neg Hx    Stomach cancer Neg Hx     Objective:  Blood pressure 128/77, pulse 83, height 5' 3.75" (1.619 m), weight 164  lb 4 oz (74.5 kg), SpO2 96 %. General: No acute distress.  Patient appears well-groomed.   Head:  Normocephalic/atraumatic Eyes:  fundi examined but not visualized Neck: supple, no paraspinal tenderness, full range of motion Back: No paraspinal tenderness Heart: regular rate and rhythm Lungs: Clear to auscultation bilaterally. Vascular: No carotid bruits. Neurological Exam: Mental status: alert and oriented to person, place, and time, recent and remote memory intact, fund of knowledge intact, attention and concentration intact, speech fluent and not dysarthric, language intact. Cranial nerves: CN I: not tested CN II: pupils equal, round and reactive to light, visual fields intact CN III, IV, VI:  full range of motion, no nystagmus, no ptosis CN V: facial sensation intact. CN VII: upper and lower face symmetric CN VIII: hearing intact CN IX, X: gag intact, uvula midline CN XI: sternocleidomastoid and trapezius muscles intact CN XII: tongue midline Bulk & Tone: normal, no fasciculations. Motor:  muscle strength 5/5 throughout Sensation:  Pinprick, temperature and vibratory sensation intact. Deep Tendon Reflexes:  2+ throughout,  toes downgoing.   Finger to nose testing:  Without dysmetria.   Heel to shin:  Without dysmetria.   Gait:  Normal station and stride.  Romberg negative.    Thank you for allowing me to take part in the care of this patient.  Metta Clines, DO  CC: Loura Pardon, MD

## 2021-06-25 ENCOUNTER — Other Ambulatory Visit: Payer: Self-pay

## 2021-06-25 ENCOUNTER — Encounter: Payer: Self-pay | Admitting: Neurology

## 2021-06-25 ENCOUNTER — Ambulatory Visit: Payer: PPO | Admitting: Neurology

## 2021-06-25 VITALS — BP 128/77 | HR 83 | Ht 63.75 in | Wt 164.2 lb

## 2021-06-25 DIAGNOSIS — R519 Headache, unspecified: Secondary | ICD-10-CM | POA: Diagnosis not present

## 2021-06-25 DIAGNOSIS — R2990 Unspecified symptoms and signs involving the nervous system: Secondary | ICD-10-CM | POA: Diagnosis not present

## 2021-06-25 NOTE — Patient Instructions (Signed)
Check MRI of brain with and without contrast and MRA of head Check EEG Follow up after testing

## 2021-06-26 ENCOUNTER — Other Ambulatory Visit: Payer: Self-pay | Admitting: Family Medicine

## 2021-06-26 DIAGNOSIS — Z1231 Encounter for screening mammogram for malignant neoplasm of breast: Secondary | ICD-10-CM

## 2021-07-03 ENCOUNTER — Other Ambulatory Visit: Payer: Self-pay

## 2021-07-03 ENCOUNTER — Ambulatory Visit: Payer: PPO | Admitting: Neurology

## 2021-07-03 DIAGNOSIS — R2990 Unspecified symptoms and signs involving the nervous system: Secondary | ICD-10-CM | POA: Diagnosis not present

## 2021-07-04 NOTE — Procedures (Signed)
ELECTROENCEPHALOGRAM REPORT  Date of Study: 07/03/2021  Patient's Name: Mary Gordon MRN: [id number] Date of Birth: 06/12/1946  Referring Provider: Loura Pardon, MD  Clinical History: 75 year old female with new onset persistent headache and episode of confusion  Medications: CHLOR-TRIMETON 4 MG tablet VITAMIN D3 10000 units capsule PEPCID 20 MG tablet IMDUR 60 MG 24 hr tablet TOPROL-XL 25 MG 24 hr tablet METROCREAM 0.75 % cream CRESTOR 5 MG tablet OVER THE COUNTER MEDICATION  Life Tones (uric acid cleanser- has celery extract and other herbs) liquid ZOLOFT 50 MG tablet DESYREL 100 MG tablet  Technical Summary: A multichannel digital EEG recording measured by the international 10-20 system with electrodes applied with paste and impedances below 5000 ohms performed in our laboratory with EKG monitoring in an awake patient.  Photic stimulation was performed.  The digital EEG was referentially recorded, reformatted, and digitally filtered in a variety of bipolar and referential montages for optimal display.    Description: The patient is awake during the recording.  During maximal wakefulness, there is a symmetric, medium voltage 9.5 Hz posterior dominant rhythm that attenuates with eye opening.  The record is symmetric.  Stage 2 sleep is not seen.  Photic stimulation did not elicit any abnormalities.  There were no epileptiform discharges or electrographic seizures seen.    EKG lead was unremarkable.  Impression: This awake EEG is normal.    Clinical Correlation: A normal EEG does not exclude a clinical diagnosis of epilepsy.  If further clinical questions remain, prolonged EEG may be helpful.  Clinical correlation is advised.   Metta Clines, DO

## 2021-07-09 ENCOUNTER — Ambulatory Visit
Admission: RE | Admit: 2021-07-09 | Discharge: 2021-07-09 | Disposition: A | Discharge status: Home / Self Care | Payer: PPO | Source: Ambulatory Visit | Attending: Neurology | Admitting: Neurology

## 2021-07-09 ENCOUNTER — Other Ambulatory Visit: Payer: Self-pay

## 2021-07-09 DIAGNOSIS — R519 Headache, unspecified: Secondary | ICD-10-CM | POA: Diagnosis not present

## 2021-07-09 MED ORDER — GADOBENATE DIMEGLUMINE 529 MG/ML IV SOLN
15.0000 mL | Freq: Once | INTRAVENOUS | Status: AC | PRN
  Administered 2021-07-09: 15 mL via INTRAVENOUS

## 2021-07-11 NOTE — Progress Notes (Signed)
LMOVM for pt, To call the office back  in regards to her MRI results.

## 2021-07-15 DIAGNOSIS — H35371 Puckering of macula, right eye: Secondary | ICD-10-CM | POA: Diagnosis not present

## 2021-07-24 NOTE — Progress Notes (Signed)
Tried calling pt, no answer. LMOVM to call the occie back.

## 2021-07-29 ENCOUNTER — Other Ambulatory Visit: Payer: Self-pay

## 2021-07-29 ENCOUNTER — Ambulatory Visit
Admission: RE | Admit: 2021-07-29 | Discharge: 2021-07-29 | Disposition: A | Discharge status: Home / Self Care | Payer: PPO | Source: Ambulatory Visit

## 2021-07-29 DIAGNOSIS — Z1231 Encounter for screening mammogram for malignant neoplasm of breast: Secondary | ICD-10-CM

## 2021-08-10 ENCOUNTER — Other Ambulatory Visit: Payer: Self-pay | Admitting: Family Medicine

## 2021-08-23 DIAGNOSIS — R5383 Other fatigue: Secondary | ICD-10-CM | POA: Diagnosis not present

## 2021-08-23 DIAGNOSIS — N951 Menopausal and female climacteric states: Secondary | ICD-10-CM | POA: Diagnosis not present

## 2021-08-28 ENCOUNTER — Other Ambulatory Visit: Payer: Self-pay | Admitting: Family Medicine

## 2021-08-28 DIAGNOSIS — I493 Ventricular premature depolarization: Secondary | ICD-10-CM | POA: Diagnosis not present

## 2021-08-28 DIAGNOSIS — Z6827 Body mass index (BMI) 27.0-27.9, adult: Secondary | ICD-10-CM | POA: Diagnosis not present

## 2021-08-28 DIAGNOSIS — N898 Other specified noninflammatory disorders of vagina: Secondary | ICD-10-CM | POA: Diagnosis not present

## 2021-08-28 DIAGNOSIS — E782 Mixed hyperlipidemia: Secondary | ICD-10-CM | POA: Diagnosis not present

## 2021-08-28 DIAGNOSIS — R232 Flushing: Secondary | ICD-10-CM | POA: Diagnosis not present

## 2021-08-28 DIAGNOSIS — N951 Menopausal and female climacteric states: Secondary | ICD-10-CM | POA: Diagnosis not present

## 2021-08-28 DIAGNOSIS — F5101 Primary insomnia: Secondary | ICD-10-CM | POA: Diagnosis not present

## 2021-08-28 DIAGNOSIS — I2589 Other forms of chronic ischemic heart disease: Secondary | ICD-10-CM | POA: Diagnosis not present

## 2021-08-29 NOTE — Telephone Encounter (Signed)
CPE scheduled on 10/09/21, both filled on 09/07/20  Trazodone #90 tabs with 3 refills an pepcid #180 with 3 refills

## 2021-09-19 ENCOUNTER — Encounter: Payer: PPO | Admitting: Family Medicine

## 2021-09-28 ENCOUNTER — Other Ambulatory Visit: Payer: Self-pay | Admitting: Family Medicine

## 2021-10-09 ENCOUNTER — Encounter: Payer: Self-pay | Admitting: Family Medicine

## 2021-10-09 ENCOUNTER — Other Ambulatory Visit: Payer: Self-pay

## 2021-10-09 ENCOUNTER — Ambulatory Visit (INDEPENDENT_AMBULATORY_CARE_PROVIDER_SITE_OTHER): Payer: PPO | Admitting: Family Medicine

## 2021-10-09 VITALS — BP 116/74 | HR 80 | Temp 97.6°F | Ht 64.25 in | Wt 160.5 lb

## 2021-10-09 DIAGNOSIS — E7800 Pure hypercholesterolemia, unspecified: Secondary | ICD-10-CM

## 2021-10-09 DIAGNOSIS — R002 Palpitations: Secondary | ICD-10-CM

## 2021-10-09 DIAGNOSIS — I272 Pulmonary hypertension, unspecified: Secondary | ICD-10-CM

## 2021-10-09 DIAGNOSIS — M85851 Other specified disorders of bone density and structure, right thigh: Secondary | ICD-10-CM | POA: Diagnosis not present

## 2021-10-09 DIAGNOSIS — F4323 Adjustment disorder with mixed anxiety and depressed mood: Secondary | ICD-10-CM | POA: Diagnosis not present

## 2021-10-09 DIAGNOSIS — R7303 Prediabetes: Secondary | ICD-10-CM

## 2021-10-09 DIAGNOSIS — Z1211 Encounter for screening for malignant neoplasm of colon: Secondary | ICD-10-CM | POA: Diagnosis not present

## 2021-10-09 DIAGNOSIS — E559 Vitamin D deficiency, unspecified: Secondary | ICD-10-CM | POA: Diagnosis not present

## 2021-10-09 DIAGNOSIS — Z0000 Encounter for general adult medical examination without abnormal findings: Secondary | ICD-10-CM | POA: Diagnosis not present

## 2021-10-09 LAB — COMPREHENSIVE METABOLIC PANEL
ALT: 18 U/L (ref 0–35)
AST: 23 U/L (ref 0–37)
Albumin: 4.3 g/dL (ref 3.5–5.2)
Alkaline Phosphatase: 107 U/L (ref 39–117)
BUN: 9 mg/dL (ref 6–23)
CO2: 31 mEq/L (ref 19–32)
Calcium: 9.2 mg/dL (ref 8.4–10.5)
Chloride: 101 mEq/L (ref 96–112)
Creatinine, Ser: 0.93 mg/dL (ref 0.40–1.20)
GFR: 60.32 mL/min (ref >60.00)
Glucose, Bld: 95 mg/dL (ref 70–99)
Potassium: 4.6 mEq/L (ref 3.5–5.1)
Sodium: 138 mEq/L (ref 135–145)
Total Bilirubin: 0.7 mg/dL (ref 0.2–1.2)
Total Protein: 6.5 g/dL (ref 6.0–8.3)

## 2021-10-09 LAB — CBC WITH DIFFERENTIAL/PLATELET
Basophils Absolute: 0 10*3/uL (ref 0.0–0.1)
Basophils Relative: 0.6 % (ref 0.0–3.0)
Eosinophils Absolute: 0.2 10*3/uL (ref 0.0–0.7)
Eosinophils Relative: 4.6 % (ref 0.0–5.0)
HCT: 39 % (ref 36.0–46.0)
Hemoglobin: 12.6 g/dL (ref 12.0–15.0)
Lymphocytes Relative: 28.7 % (ref 12.0–46.0)
Lymphs Abs: 1.5 10*3/uL (ref 0.7–4.0)
MCHC: 32.4 g/dL (ref 30.0–36.0)
MCV: 86.2 fl (ref 78.0–100.0)
Monocytes Absolute: 0.4 10*3/uL (ref 0.1–1.0)
Monocytes Relative: 7.2 % (ref 3.0–12.0)
Neutro Abs: 3.1 10*3/uL (ref 1.4–7.7)
Neutrophils Relative %: 58.9 % (ref 43.0–77.0)
Platelets: 252 10*3/uL (ref 150.0–400.0)
RBC: 4.53 Mil/uL (ref 3.87–5.11)
RDW: 13.5 % (ref 11.5–15.5)
WBC: 5.2 10*3/uL (ref 4.0–10.5)

## 2021-10-09 LAB — LIPID PANEL
Cholesterol: 179 mg/dL (ref 0–200)
HDL: 54.2 mg/dL (ref >39.00)
LDL Cholesterol: 99 mg/dL (ref 0–99)
NonHDL: 124.52
Total CHOL/HDL Ratio: 3
Triglycerides: 130 mg/dL (ref 0.0–149.0)
VLDL: 26 mg/dL (ref 0.0–40.0)

## 2021-10-09 LAB — HEMOGLOBIN A1C: Hgb A1c MFr Bld: 5.6 % (ref 4.6–6.5)

## 2021-10-09 LAB — VITAMIN D 25 HYDROXY (VIT D DEFICIENCY, FRACTURES): VITD: 100.66 ng/mL — ABNORMAL HIGH (ref 30.00–100.00)

## 2021-10-09 LAB — TSH: TSH: 2.17 u[IU]/mL (ref 0.35–5.50)

## 2021-10-09 NOTE — Assessment & Plan Note (Signed)
Stable dexa 5/22 One fall/no fractures Taking vit D  Very good exercise   Discussed fall prevention

## 2021-10-09 NOTE — Assessment & Plan Note (Signed)
A1C ordered  Is mindful of added sugar in diet   disc imp of low glycemic diet and wt loss to prevent DM2

## 2021-10-09 NOTE — Assessment & Plan Note (Signed)
Reviewed health habits including diet and exercise and skin cancer prevention Reviewed appropriate screening tests for age  Also reviewed health mt list, fam hx and immunization status , as well as social and family history   See HPI Pt declines colon cancer screening of any kind Mammogram is utd Plans to see if shingrix is covered Dexa utd, one fall and no fx Discussed fall prev Adv directive is utd  No cognitive concerns /handles own affairs  Nl hearing screen  utd eye/vision care phq score of 0 No problems with ADLs

## 2021-10-09 NOTE — Patient Instructions (Addendum)
If you are interested in the new shingles vaccine (Shingrix) - call your local pharmacy to check on coverage and availability  If affordable, get on a wait list at your pharmacy to get the vaccine.  Labs today   Keep exercising and taking care of yourself

## 2021-10-09 NOTE — Assessment & Plan Note (Signed)
No problems or clinical changes

## 2021-10-09 NOTE — Assessment & Plan Note (Signed)
Takes metoprolol prn  No recent problems

## 2021-10-09 NOTE — Assessment & Plan Note (Signed)
phq score of 0 Doing well with sertraline 50 mg daily and trazodone 100 mg daily

## 2021-10-09 NOTE — Assessment & Plan Note (Signed)
Disc goals for lipids and reasons to control them Rev last labs with pt Rev low sat fat diet in detail Takes crestor 5 mg twice weekly   Lab ordered

## 2021-10-09 NOTE — Assessment & Plan Note (Signed)
Declines screening at her age

## 2021-10-09 NOTE — Assessment & Plan Note (Signed)
Level ordered  Discussed imp to bone and overall health  Taking 10,000 iu daily

## 2021-10-09 NOTE — Progress Notes (Signed)
Subjective:    Patient ID: Mary Gordon, female    DOB: 20-Oct-1946, 75 y.o.   MRN: [id number]  This visit occurred during the SARS-CoV-2 public health emergency.  Safety protocols were in place, including screening questions prior to the visit, additional usage of staff PPE, and extensive cleaning of exam room while observing appropriate contact time as indicated for disinfecting solutions.   HPI Pt presents for amw and health mt visit   I have personally reviewed the Medicare Annual Wellness questionnaire and have noted 1. The patient's medical and social history 2. Their use of alcohol, tobacco or illicit drugs 3. Their current medications and supplements 4. The patient's functional ability including ADL's, fall risks, home safety risks and hearing or visual             impairment. 5. Diet and physical activities 6. Evidence for depression or mood disorders  The patients weight, height, BMI have been recorded in the chart and visual acuity is per eye clinic.  I have made referrals, counseling and provided education to the patient based review of the above and I have provided the pt with a written personalized care plan for preventive services. Reviewed and updated provider list, see scanned forms.  See scanned forms.  Routine anticipatory guidance given to patient.  See health maintenance. Colon cancer screening  colonoscopy 11/12 -due but not interested No family h/o colon cancer , would rather not do that, declines any screening  Breast cancer screening 8/22 Self breast exam: no lumps  Flu vaccine: had at pharmacy in [place] vaccinated: interested in shingrix  Tetanus vaccine 10/21 Tdap Pneumovax completed Zoster vaccine:  interested if covered Dexa 5/22 fairly stable osteopenia  Falls : fell once this year on pickleball ct, got up and finished game  Fractures: none Supplements: vit D  Exercise : pickleball, regular and stairs in house   Advance directive: up  to date  Cognitive function addressed- see scanned forms- and if abnormal then additional documentation follows.   No concerns Mentally sharp Occ forgets something/short term  No confusion   PMH and SH reviewed  Meds, vitals, and allergies reviewed.   ROS: See HPI.  Otherwise negative.    Weight : Wt Readings from Last 3 Encounters:  10/09/21 160 lb 8 oz (72.8 kg)  06/25/21 164 lb 4 oz (74.5 kg)  05/02/21 158 lb 8 oz (71.9 kg)   27.34 kg/m  BP Readings from Last 3 Encounters:  10/09/21 116/74  06/25/21 128/77  05/02/21 114/68   Pulse Readings from Last 3 Encounters:  10/09/21 80  06/25/21 83  05/02/21 72   Hearing/vision: Hearing Screening   500Hz  1000Hz  2000Hz  4000Hz   Right ear 40 40 40 40  Left ear 40 40 40 40  Vision Screening - Comments:: [place] in 09/22   PHQ: Depression screen [facility] 2/9 10/09/2021 09/07/2020 09/02/2019 08/20/2018 08/19/2017  Decreased Interest 0 0 0 0 0  Down, Depressed, Hopeless 0 0 0 0 0  PHQ - 2 Score 0 0 0 0 0  Altered sleeping - - - 0 0  Tired, decreased energy - - - 0 0  Change in appetite - - - 0 0  Feeling bad or failure about yourself  - - - 0 0  Trouble concentrating - - - 0 0  Moving slowly or fidgety/restless - - - 0 0  Suicidal thoughts - - - 0 0  PHQ-9 Score - - - 0 0  Difficult  doing work/chores - - - Not difficult at all Not difficult at all   Taking zoloft for mood in setting of stress Also trazodone  Doing fair with mood / tough with care of husband and father  Son is getting over bacterial meningitis   ADLs: no problems at all  Functionality: excellent   Care team  Shammond Arave-pcp Porfilio-oph [place]  Due for labs today   Hyperlipidemia Lab Results  Component Value Date   CHOL 182 10/30/2020   HDL 59.90 10/30/2020   LDLCALC 98 10/30/2020   LDLDIRECT 161.5 10/24/2013   TRIG 123.0 10/30/2020   CHOLHDL 3 10/30/2020   Takes crestor twice weekly  Prediabetes Lab  Results  Component Value Date   HGBA1C 5.7 08/27/2020     Patient Active Problem List   Diagnosis Date Noted   Estrogen deficiency 09/07/2020   Sprain of ankle 06/09/2018   Laryngopharyngeal reflux (LPR) 01/08/2017   Pharyngeal dysphagia 01/08/2017   Prediabetes 08/25/2016   Routine general medical examination at a health care facility 08/19/2016   Stress reaction 09/17/2015   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   Rosacea 12/27/2014   Pulmonary HTN ([place]) 10/20/2014   DOE (dyspnea on exertion) 09/26/2014   PVCs (premature ventricular contractions) 09/26/2014   Encounter for Medicare annual wellness exam 03/18/2014   Fibromyalgia 05/31/2012   Special screening for malignant neoplasms, colon 09/26/2011   GERD (gastroesophageal reflux disease) 07/08/2011   CERVICAL RADICULOPATHY, RIGHT 09/04/2010   PULMONARY NODULE 09/14/2009   PALPITATIONS 08/17/2009   Vitamin D deficiency 02/22/2009   HYPERCHOLESTEROLEMIA 02/22/2009   Adjustment disorder with mixed anxiety and depressed mood 02/22/2009   ALLERGIC RHINITIS 02/22/2009   MENOPAUSAL SYNDROME 02/22/2009   Osteopenia 02/22/2009   Past Medical History:  Diagnosis Date   Allergy    all year   Anxiety    Bursitis of hip    Cataract    Cervical cancer ([place]) 1978   Chronic kidney disease    RENAL INSUFF   Depression    Dysrhythmia    PVC'S   Fibromyalgia    GERD (gastroesophageal reflux disease)    High cholesterol    History of Clostridium difficile infection    In past from augmentin   History of hiatal hernia    Osteopenia    Palpitations    Panic disorder    Pulmonary HTN ([place]) 10/20/2014   moderate with PASP 90mmHg   Pulmonary nodule    Being observed   Vitamin D deficiency    Past Surgical History:  Procedure Laterality Date   ABDOMINAL HYSTERECTOMY  1978   CARDIAC CATHETERIZATION     CATARACT EXTRACTION W/PHACO Right 07/14/2017   Procedure: CATARACT EXTRACTION PHACO AND INTRAOCULAR LENS PLACEMENT ([place]);   Surgeon: Birder Robson, MD;  Location: [facility] ORS;  Service: Ophthalmology;  Laterality: Right;  [place] 00:31 AP% 15.7 CDE 4.97 Fluid pack lot # [id number]   CATARACT EXTRACTION W/PHACO Left 08/04/2017   Procedure: CATARACT EXTRACTION PHACO AND INTRAOCULAR LENS PLACEMENT ([facility]);  Surgeon: Birder Robson, MD;  Location: [facility] ORS;  Service: Ophthalmology;  Laterality: Left;  [place] 00:34 AP% 18.7 CDE 6.42 Fluid pack lot # [id number]   COLONOSCOPY  2002   Normal   HAND SURGERY  2005   Left hand thrombosis   NASAL HEMORRHAGE CONTROL  2007   RIGHT HEART CATHETERIZATION N/A 03/16/2015   Procedure: RIGHT HEART CATH;  Surgeon: Larey Dresser, MD;  Location: [facility] CATH LAB;  Service: Cardiovascular;  Laterality: N/A;   SHOULDER  SURGERY  2008   Right shoulder bone spur   THYROGLOSSAL DUCT CYST     Social History   Tobacco Use   Smoking status: Former    Packs/day: 1.00    Years: 17.00    Pack years: 17.00    Types: Cigarettes    Quit date: 12/08/1978    Years since quitting: 42.8   Smokeless tobacco: Never  Vaping Use   Vaping Use: Never used  Substance Use Topics   Alcohol use: No    Alcohol/week: 0.0 standard drinks   Drug use: No   Family History  Problem Relation Age of Onset   Heart attack Mother 17   Emphysema Mother    Allergies Mother    Asthma Mother    Allergies Father    Glaucoma Father    Arrhythmia Sister        Atrial fibrillation   Cancer Sister        breast   Breast cancer Sister    Emphysema Sister    Heart disease Sister    Rheumatologic disease Sister    Lung cancer Sister    Diabetes Other        Parent   Hyperlipidemia Other        Other relative   Hypertension Other        Parent, other relative   Breast cancer Other        Other relative, sister   Allergies Other        Siblings   Asthma Other        Sisters   Colon cancer Neg Hx    Esophageal cancer Neg Hx    Rectal cancer Neg Hx    Stomach cancer Neg Hx    Allergies  Allergen Reactions    Adhesive [Tape] Rash and Other (See Comments)    "Burns" the skin   Amoxicillin-Pot Clavulanate Other (See Comments)    Resulted in C-DIFF!!!!   Macrobid [Nitrofurantoin] Diarrhea    Severe abdominal cramping   Simvastatin Other (See Comments)    Caused severe muscle pain   Current Outpatient Medications on File Prior to Visit  Medication Sig Dispense Refill   chlorpheniramine (CHLOR-TRIMETON) 4 MG tablet Take 8 mg by mouth 2 (two) times daily.     Cholecalciferol (VITAMIN D3) 10000 units capsule Take 10,000 Units by mouth daily.     famotidine (PEPCID) 20 MG tablet TAKE 1 TABLET BY MOUTH TWICE A DAY 180 tablet 0   isosorbide mononitrate (IMDUR) 60 MG 24 hr tablet TAKE 1 TABLET (60 MG TOTAL) BY MOUTH DAILY. 90 tablet 3   metoprolol succinate (TOPROL-XL) 25 MG 24 hr tablet Take 1 tablet (25 mg total) by mouth daily. 90 tablet 3   NONFORMULARY OR COMPOUNDED ITEM compound testosterone cream     OVER THE COUNTER MEDICATION Take 5-7 mLs by mouth See admin instructions. Life Tones (uric acid cleanser- has celery extract and other herbs) liquid: Take 5-7 ml's by mouth once a day     pantoprazole (PROTONIX) 40 MG tablet TAKE 1 TABLET BY MOUTH EVERY DAY AS NEEDED FOR REFLUX 90 tablet 0   rosuvastatin (CRESTOR) 5 MG tablet TAKE 1 TABLET BY MOUTH TWICE A WEEK ON MONDAY/THURSDAY 24 tablet 0   sertraline (ZOLOFT) 50 MG tablet Take 1 tablet (50 mg total) by mouth daily. 90 tablet 3   traZODone (DESYREL) 100 MG tablet TAKE 1 TABLET BY MOUTH EVERYDAY AT BEDTIME 90 tablet 0   No current facility-administered medications  on file prior to visit.     Review of Systems  Constitutional:  Negative for activity change, appetite change, fatigue, fever and unexpected weight change.  HENT:  Negative for congestion, ear pain, rhinorrhea, sinus pressure and sore throat.   Eyes:  Negative for pain, redness and visual disturbance.  Respiratory:  Negative for cough, shortness of breath and wheezing.   Cardiovascular:   Negative for chest pain and palpitations.  Gastrointestinal:  Negative for abdominal pain, blood in stool, constipation and diarrhea.  Endocrine: Negative for polydipsia and polyuria.  Genitourinary:  Negative for dysuria, frequency and urgency.  Musculoskeletal:  Negative for arthralgias, back pain and myalgias.  Skin:  Negative for pallor and rash.  Allergic/Immunologic: Negative for environmental allergies.  Neurological:  Negative for dizziness, syncope and headaches.  Hematological:  Negative for adenopathy. Does not bruise/bleed easily.  Psychiatric/Behavioral:  Negative for decreased concentration and dysphoric mood. The patient is not nervous/anxious.       Objective:   Physical Exam Constitutional:      General: She is not in acute distress.    Appearance: Normal appearance. She is well-developed and normal weight. She is not ill-appearing or diaphoretic.  HENT:     Head: Normocephalic and atraumatic.     Right Ear: Tympanic membrane, ear canal and external ear normal.     Left Ear: Tympanic membrane, ear canal and external ear normal.     Nose: Nose normal. No congestion.     Mouth/Throat:     Mouth: Mucous membranes are moist.     Pharynx: Oropharynx is clear. No posterior oropharyngeal erythema.  Eyes:     General: No scleral icterus.    Extraocular Movements: Extraocular movements intact.     Conjunctiva/sclera: Conjunctivae normal.     Pupils: Pupils are equal, round, and reactive to light.  Neck:     Thyroid: No thyromegaly.     Vascular: No carotid bruit or JVD.  Cardiovascular:     Rate and Rhythm: Normal rate and regular rhythm.     Pulses: Normal pulses.     Heart sounds: Normal heart sounds.    No gallop.  Pulmonary:     Effort: Pulmonary effort is normal. No respiratory distress.     Breath sounds: Normal breath sounds. No wheezing.     Comments: Good air exch Chest:     Chest wall: No tenderness.  Abdominal:     General: Bowel sounds are normal.  There is no distension or abdominal bruit.     Palpations: Abdomen is soft. There is no mass.     Tenderness: There is no abdominal tenderness.     Hernia: No hernia is present.  Genitourinary:    Comments: Breast exam: No mass, nodules, thickening, tenderness, bulging, retraction, inflamation, nipple discharge or skin changes noted.  No axillary or clavicular LA.     Musculoskeletal:        General: No tenderness. Normal range of motion.     Cervical back: Normal range of motion and neck supple. No rigidity. No muscular tenderness.     Right lower leg: No edema.     Left lower leg: No edema.     Comments: No kyphosis   Lymphadenopathy:     Cervical: No cervical adenopathy.  Skin:    General: Skin is warm and dry.     Coloration: Skin is not pale.     Findings: No erythema or rash.     Comments: Solar lentigines diffusely Some scattered  SKs  Neurological:     Mental Status: She is alert. Mental status is at baseline.     Cranial Nerves: No cranial nerve deficit.     Motor: No abnormal muscle tone.     Coordination: Coordination normal.     Gait: Gait normal.     Deep Tendon Reflexes: Reflexes are normal and symmetric. Reflexes normal.  Psychiatric:        Mood and Affect: Mood normal.        Cognition and Memory: Cognition and memory normal.          Assessment & Plan:   Problem List Items Addressed This Visit       Cardiovascular and Mediastinum   Pulmonary HTN ([place])    No problems or clinical changes        Musculoskeletal and Integument   Osteopenia    Stable dexa 5/22 One fall/no fractures Taking vit D  Very good exercise   Discussed fall prevention         Other   Vitamin D deficiency    Level ordered  Discussed imp to bone and overall health  Taking 10,000 iu daily      Relevant Orders   VITAMIN D 25 Hydroxy (Vit-D Deficiency, Fractures)   HYPERCHOLESTEROLEMIA    Disc goals for lipids and reasons to control them Rev last labs with pt Rev low  sat fat diet in detail Takes crestor 5 mg twice weekly   Lab ordered      Relevant Orders   Lipid panel   Adjustment disorder with mixed anxiety and depressed mood    phq score of 0 Doing well with sertraline 50 mg daily and trazodone 100 mg daily         PALPITATIONS    Takes metoprolol prn  No recent problems       Special screening for malignant neoplasms, colon    Declines screening at her age       Encounter for Medicare annual wellness exam - Primary    Reviewed health habits including diet and exercise and skin cancer prevention Reviewed appropriate screening tests for age  Also reviewed health mt list, fam hx and immunization status , as well as social and family history   See HPI Pt declines colon cancer screening of any kind Mammogram is utd Plans to see if shingrix is covered Dexa utd, one fall and no fx Discussed fall prev Adv directive is utd  No cognitive concerns /handles own affairs  Nl hearing screen  utd eye/vision care phq score of 0 No problems with ADLs        Routine general medical examination at a health care facility    Reviewed health habits including diet and exercise and skin cancer prevention Reviewed appropriate screening tests for age  Also reviewed health mt list, fam hx and immunization status , as well as social and family history   See HPI Pt declines colon cancer screening of any kind Mammogram is utd Plans to see if shingrix is covered Dexa utd, one fall and no fx Discussed fall prev Adv directive is utd  No cognitive concerns /handles own affairs  Nl hearing screen  utd eye/vision care phq score of 0 No problems with ADLs       Relevant Orders   CBC with Differential/Platelet   Comprehensive metabolic panel   TSH   Prediabetes    A1C ordered  Is mindful of added sugar in diet   disc imp  of low glycemic diet and wt loss to prevent DM2       Relevant Orders   Hemoglobin A1c

## 2021-10-11 ENCOUNTER — Other Ambulatory Visit: Payer: Self-pay | Admitting: Family Medicine

## 2021-10-17 DIAGNOSIS — L570 Actinic keratosis: Secondary | ICD-10-CM | POA: Diagnosis not present

## 2021-10-17 DIAGNOSIS — D225 Melanocytic nevi of trunk: Secondary | ICD-10-CM | POA: Diagnosis not present

## 2021-10-17 DIAGNOSIS — R5383 Other fatigue: Secondary | ICD-10-CM | POA: Diagnosis not present

## 2021-10-17 DIAGNOSIS — C44622 Squamous cell carcinoma of skin of right upper limb, including shoulder: Secondary | ICD-10-CM | POA: Diagnosis not present

## 2021-10-17 DIAGNOSIS — D485 Neoplasm of uncertain behavior of skin: Secondary | ICD-10-CM | POA: Diagnosis not present

## 2021-10-17 DIAGNOSIS — L538 Other specified erythematous conditions: Secondary | ICD-10-CM | POA: Diagnosis not present

## 2021-10-17 DIAGNOSIS — L718 Other rosacea: Secondary | ICD-10-CM | POA: Diagnosis not present

## 2021-10-17 DIAGNOSIS — L820 Inflamed seborrheic keratosis: Secondary | ICD-10-CM | POA: Diagnosis not present

## 2021-10-17 DIAGNOSIS — N951 Menopausal and female climacteric states: Secondary | ICD-10-CM | POA: Diagnosis not present

## 2021-10-17 DIAGNOSIS — L821 Other seborrheic keratosis: Secondary | ICD-10-CM | POA: Diagnosis not present

## 2021-10-17 DIAGNOSIS — L814 Other melanin hyperpigmentation: Secondary | ICD-10-CM | POA: Diagnosis not present

## 2021-10-21 DIAGNOSIS — Z6828 Body mass index (BMI) 28.0-28.9, adult: Secondary | ICD-10-CM | POA: Diagnosis not present

## 2021-10-21 DIAGNOSIS — N951 Menopausal and female climacteric states: Secondary | ICD-10-CM | POA: Diagnosis not present

## 2021-10-21 DIAGNOSIS — R6882 Decreased libido: Secondary | ICD-10-CM | POA: Diagnosis not present

## 2021-10-21 DIAGNOSIS — R232 Flushing: Secondary | ICD-10-CM | POA: Diagnosis not present

## 2021-10-24 DIAGNOSIS — D0461 Carcinoma in situ of skin of right upper limb, including shoulder: Secondary | ICD-10-CM | POA: Diagnosis not present

## 2021-11-08 ENCOUNTER — Other Ambulatory Visit: Payer: Self-pay | Admitting: Family Medicine

## 2021-11-21 ENCOUNTER — Ambulatory Visit: Payer: PPO | Admitting: Neurology

## 2021-11-21 DIAGNOSIS — D485 Neoplasm of uncertain behavior of skin: Secondary | ICD-10-CM | POA: Diagnosis not present

## 2021-11-21 DIAGNOSIS — C44729 Squamous cell carcinoma of skin of left lower limb, including hip: Secondary | ICD-10-CM | POA: Diagnosis not present

## 2021-11-27 ENCOUNTER — Other Ambulatory Visit: Payer: Self-pay | Admitting: Family Medicine

## 2021-11-27 NOTE — Telephone Encounter (Signed)
Trazodone last filled on 08/29/21 #90 tabs with 0 refills, CPE was on 10/09/21

## 2021-12-16 DIAGNOSIS — N951 Menopausal and female climacteric states: Secondary | ICD-10-CM | POA: Diagnosis not present

## 2021-12-16 DIAGNOSIS — R5383 Other fatigue: Secondary | ICD-10-CM | POA: Diagnosis not present

## 2021-12-19 DIAGNOSIS — N898 Other specified noninflammatory disorders of vagina: Secondary | ICD-10-CM | POA: Diagnosis not present

## 2021-12-19 DIAGNOSIS — Z6828 Body mass index (BMI) 28.0-28.9, adult: Secondary | ICD-10-CM | POA: Diagnosis not present

## 2021-12-19 DIAGNOSIS — R232 Flushing: Secondary | ICD-10-CM | POA: Diagnosis not present

## 2021-12-19 DIAGNOSIS — N951 Menopausal and female climacteric states: Secondary | ICD-10-CM | POA: Diagnosis not present

## 2021-12-21 ENCOUNTER — Encounter: Payer: Self-pay | Admitting: Gastroenterology

## 2021-12-30 DIAGNOSIS — C44729 Squamous cell carcinoma of skin of left lower limb, including hip: Secondary | ICD-10-CM | POA: Diagnosis not present

## 2022-01-27 DIAGNOSIS — L905 Scar conditions and fibrosis of skin: Secondary | ICD-10-CM | POA: Diagnosis not present

## 2022-01-27 DIAGNOSIS — D2262 Melanocytic nevi of left upper limb, including shoulder: Secondary | ICD-10-CM | POA: Diagnosis not present

## 2022-01-27 DIAGNOSIS — D2261 Melanocytic nevi of right upper limb, including shoulder: Secondary | ICD-10-CM | POA: Diagnosis not present

## 2022-01-27 DIAGNOSIS — Z4801 Encounter for change or removal of surgical wound dressing: Secondary | ICD-10-CM | POA: Diagnosis not present

## 2022-01-27 DIAGNOSIS — L821 Other seborrheic keratosis: Secondary | ICD-10-CM | POA: Diagnosis not present

## 2022-01-27 DIAGNOSIS — Z08 Encounter for follow-up examination after completed treatment for malignant neoplasm: Secondary | ICD-10-CM | POA: Diagnosis not present

## 2022-01-27 DIAGNOSIS — L814 Other melanin hyperpigmentation: Secondary | ICD-10-CM | POA: Diagnosis not present

## 2022-01-27 DIAGNOSIS — Z86007 Personal history of in-situ neoplasm of skin: Secondary | ICD-10-CM | POA: Diagnosis not present

## 2022-01-31 ENCOUNTER — Other Ambulatory Visit: Payer: Self-pay

## 2022-01-31 ENCOUNTER — Ambulatory Visit (INDEPENDENT_AMBULATORY_CARE_PROVIDER_SITE_OTHER): Payer: PPO | Admitting: Family

## 2022-01-31 ENCOUNTER — Encounter: Payer: Self-pay | Admitting: Family

## 2022-01-31 VITALS — BP 104/74 | HR 80 | Ht 64.0 in | Wt 165.0 lb

## 2022-01-31 DIAGNOSIS — N3000 Acute cystitis without hematuria: Secondary | ICD-10-CM | POA: Diagnosis not present

## 2022-01-31 DIAGNOSIS — R208 Other disturbances of skin sensation: Secondary | ICD-10-CM

## 2022-01-31 LAB — POCT URINALYSIS DIPSTICK
Bilirubin, UA: NEGATIVE
Blood, UA: NEGATIVE
Glucose, UA: NEGATIVE
Ketones, UA: NEGATIVE
Nitrite, UA: POSITIVE
Protein, UA: NEGATIVE
Spec Grav, UA: 1.015 (ref 1.010–1.025)
Urobilinogen, UA: 0.2 E.U./dL
pH, UA: 6 (ref 5.0–8.0)

## 2022-01-31 MED ORDER — SULFAMETHOXAZOLE-TRIMETHOPRIM 800-160 MG PO TABS: 1.0000 | ORAL_TABLET | Freq: Two times a day (BID) | ORAL | 0 refills | Status: AC

## 2022-01-31 NOTE — Progress Notes (Signed)
Established Patient Office Visit  Subjective:  Patient ID: Mary Gordon, female    DOB: Nov 18, 1946  Age: 76 y.o. MRN: [id number]  CC:  Chief Complaint  Patient presents with   Urinary Tract Infection    Burning, cloudy, odor--10 days    HPI Vibha Ferdig is here today with concerns.   10 days ago with dysuria, cloudy, urinary ordor.  Also with urinary frequency and urgency.  Denies flank pain no fever or chills.   No vaginal discharge.   Past Medical History:  Diagnosis Date   Allergy    all year   Anxiety    Bursitis of hip    Cataract    Cervical cancer ([place]) 1978   Chronic kidney disease    RENAL INSUFF   Depression    Dysrhythmia    PVC'S   Fibromyalgia    GERD (gastroesophageal reflux disease)    High cholesterol    History of Clostridium difficile infection    In past from augmentin   History of hiatal hernia    Osteopenia    Palpitations    Panic disorder    Pulmonary HTN ([place]) 10/20/2014   moderate with PASP 3mmHg   Pulmonary nodule    Being observed   Vitamin D deficiency     Past Surgical History:  Procedure Laterality Date   ABDOMINAL HYSTERECTOMY  1978   CARDIAC CATHETERIZATION     CATARACT EXTRACTION W/PHACO Right 07/14/2017   Procedure: CATARACT EXTRACTION PHACO AND INTRAOCULAR LENS PLACEMENT ([place]);  Surgeon: Birder Robson, MD;  Location: [facility] ORS;  Service: Ophthalmology;  Laterality: Right;  [place] 00:31 AP% 15.7 CDE 4.97 Fluid pack lot # [id number]   CATARACT EXTRACTION W/PHACO Left 08/04/2017   Procedure: CATARACT EXTRACTION PHACO AND INTRAOCULAR LENS PLACEMENT ([facility]);  Surgeon: Birder Robson, MD;  Location: [facility] ORS;  Service: Ophthalmology;  Laterality: Left;  [place] 00:34 AP% 18.7 CDE 6.42 Fluid pack lot # [id number]   COLONOSCOPY  2002   Normal   HAND SURGERY  2005   Left hand thrombosis   NASAL HEMORRHAGE CONTROL  2007   RIGHT HEART CATHETERIZATION N/A 03/16/2015   Procedure: RIGHT HEART  CATH;  Surgeon: Larey Dresser, MD;  Location: [facility] CATH LAB;  Service: Cardiovascular;  Laterality: N/A;   SHOULDER SURGERY  2008   Right shoulder bone spur   THYROGLOSSAL DUCT CYST      Family History  Problem Relation Age of Onset   Heart attack Mother 57   Emphysema Mother    Allergies Mother    Asthma Mother    Allergies Father    Glaucoma Father    Arrhythmia Sister        Atrial fibrillation   Cancer Sister        breast   Breast cancer Sister    Emphysema Sister    Heart disease Sister    Rheumatologic disease Sister    Lung cancer Sister    Diabetes Other        Parent   Hyperlipidemia Other        Other relative   Hypertension Other        Parent, other relative   Breast cancer Other        Other relative, sister   Allergies Other        Siblings   Asthma Other        Sisters   Colon cancer Neg Hx    Esophageal cancer Neg Hx  Rectal cancer Neg Hx    Stomach cancer Neg Hx     Social History   Socioeconomic History   Marital status: Married    Spouse name: Not on file   Number of children: Not on file   Years of education: Not on file   Highest education level: Not on file  Occupational History   Occupation: Retired from book keeping  Tobacco Use   Smoking status: Former    Packs/day: 1.00    Years: 17.00    Pack years: 17.00    Types: Cigarettes    Quit date: 12/08/1978    Years since quitting: 43.1   Smokeless tobacco: Never  Vaping Use   Vaping Use: Never used  Substance and Sexual Activity   Alcohol use: No    Alcohol/week: 0.0 standard drinks   Drug use: No   Sexual activity: Yes    Partners: Male  Other Topics Concern   Not on file  Social History Narrative   Married (husband is colon cancer survivor)   Does not get regular exercise   G4P1   Social Determinants of Health   Financial Resource Strain: Not on file  Food Insecurity: Not on file  Transportation Needs: Not on file  Physical  Activity: Not on file  Stress: Not on file  Social Connections: Not on file  Intimate Partner Violence: Not on file    Outpatient Medications Prior to Visit  Medication Sig Dispense Refill   chlorpheniramine (CHLOR-TRIMETON) 4 MG tablet Take 8 mg by mouth 2 (two) times daily.     Cholecalciferol (VITAMIN D3) 10000 units capsule Take 10,000 Units by mouth daily.     famotidine (PEPCID) 20 MG tablet TAKE 1 TABLET BY MOUTH TWICE A DAY 180 tablet 2   isosorbide mononitrate (IMDUR) 60 MG 24 hr tablet TAKE 1 TABLET (60 MG TOTAL) BY MOUTH DAILY. 90 tablet 3   metoprolol succinate (TOPROL-XL) 25 MG 24 hr tablet TAKE 1 TABLET BY MOUTH EVERY DAY 90 tablet 2   NONFORMULARY OR COMPOUNDED ITEM compound testosterone cream     OVER THE COUNTER MEDICATION Take 5-7 mLs by mouth See admin instructions. Life Tones (uric acid cleanser- has celery extract and other herbs) liquid: Take 5-7 ml's by mouth once a day     pantoprazole (PROTONIX) 40 MG tablet TAKE 1 TABLET BY MOUTH EVERY DAY AS NEEDED FOR REFLUX 90 tablet 0   rosuvastatin (CRESTOR) 5 MG tablet TAKE 1 TABLET BY MOUTH TWICE A WEEK ON MONDAY/THURSDAY 24 tablet 2   sertraline (ZOLOFT) 50 MG tablet TAKE 1 TABLET BY MOUTH EVERY DAY 90 tablet 3   traZODone (DESYREL) 100 MG tablet TAKE 1 TABLET BY MOUTH EVERYDAY AT BEDTIME 90 tablet 3   No facility-administered medications prior to visit.    Allergies  Allergen Reactions   Adhesive [Tape] Rash and Other (See Comments)    "Burns" the skin   Amoxicillin-Pot Clavulanate Other (See Comments)    Resulted in C-DIFF!!!!   Macrobid [Nitrofurantoin] Diarrhea    Severe abdominal cramping   Simvastatin Other (See Comments)    Caused severe muscle pain    ROS Review of Systems  Constitutional:  Negative for chills and fever.  Gastrointestinal:  Negative for abdominal pain.  Genitourinary:  Positive for dysuria, frequency and urgency. Negative for difficulty urinating, flank pain, hematuria,  pelvic pain and vaginal discharge.  Musculoskeletal:  Negative for arthralgias.     Objective:    Physical Exam Constitutional:  General: She is not in acute distress.    Appearance: Normal appearance. She is well-developed and well-groomed. She is not ill-appearing.  HENT:     Nose: No congestion or rhinorrhea.     Mouth/Throat:     Pharynx: No pharyngeal swelling, oropharyngeal exudate or posterior oropharyngeal erythema.     Tonsils: No tonsillar exudate.  Neck:     Thyroid: No thyroid mass.  Abdominal:     General: Abdomen is flat.     Tenderness: There is no abdominal tenderness.  Musculoskeletal:     Lumbar back: Normal. No tenderness.  Lymphadenopathy:     Cervical:     Right cervical: No superficial cervical adenopathy.    Left cervical: No superficial cervical adenopathy.  Neurological:     Mental Status: She is alert.    BP 104/74    Pulse 80    Ht 5\' 4"  (1.626 m)    Wt 165 lb (74.8 kg)    SpO2 97%    BMI 28.32 kg/m  Wt Readings from Last 3 Encounters:  01/31/22 165 lb (74.8 kg)  10/09/21 160 lb 8 oz (72.8 kg)  06/25/21 164 lb 4 oz (74.5 kg)     Health Maintenance Due  Topic Date Due   Zoster Vaccines- Shingrix (1 of 2) Never done   COVID-19 Vaccine (5 - Booster for Pfizer series) 05/17/2021    There are no preventive care reminders to display for this patient.  Lab Results  Component Value Date   TSH 2.17 10/09/2021   Lab Results  Component Value Date   WBC 5.2 10/09/2021   HGB 12.6 10/09/2021   HCT 39.0 10/09/2021   MCV 86.2 10/09/2021   PLT 252.0 10/09/2021   Lab Results  Component Value Date   NA 138 10/09/2021   K 4.6 10/09/2021   CO2 31 10/09/2021   GLUCOSE 95 10/09/2021   BUN 9 10/09/2021   CREATININE 0.93 10/09/2021   BILITOT 0.7 10/09/2021   ALKPHOS 107 10/09/2021   AST 23 10/09/2021   ALT 18 10/09/2021   PROT 6.5 10/09/2021   ALBUMIN 4.3 10/09/2021   CALCIUM 9.2 10/09/2021   ANIONGAP 11 12/08/2018   GFR 60.32  10/09/2021   Lab Results  Component Value Date   HGBA1C 5.6 10/09/2021      Assessment & Plan:   Problem List Items Addressed This Visit       Genitourinary   Acute cystitis without hematuria    Suspected UTI will treat with Bactrim over the weekend is symptomatic.  Pending urine culture to determine antibiotic sensitivity increase oral hydration      Relevant Medications   sulfamethoxazole-trimethoprim (BACTRIM DS) 800-160 MG tablet   Other Relevant Orders   Urine Culture (Completed)     Other   Burning sensation - Primary    Urinary culture ordered and pending results      Relevant Medications   sulfamethoxazole-trimethoprim (BACTRIM DS) 800-160 MG tablet   Other Relevant Orders   POCT Urinalysis Dipstick (Completed)   Urine Culture (Completed)    Meds ordered this encounter  Medications   sulfamethoxazole-trimethoprim (BACTRIM DS) 800-160 MG tablet    Sig: Take 1 tablet by mouth 2 (two) times daily for 7 days.    Dispense:  14 tablet    Refill:  0    Order Specific Question:   Supervising Provider    Answer:   BEDSOLE, AMY E [[id number]]    Follow-up: Return if symptoms worsen or fail to improve.  Eugenia Pancoast, FNP

## 2022-01-31 NOTE — Patient Instructions (Signed)
It was a pleasure seeing you today.   You were found to have a urinary tract infection, you have been prescribed an antibiotic to your preferred pharmacy. Please start antibiotic today as directed.   We are sending your urine for a culture to make sure you do not have a resistant bacteria. We will call you if we need to change your medications.   Please make sure you are drinking plenty of fluids over the next few days.  If your symptoms do not improve over the next 5-7 days, or if they worsen, please let us know. Please also let us know if you have worsening back pain, fevers, chills, or body aches.   Regards,   Nashalie Sallis  

## 2022-02-02 LAB — URINE CULTURE
MICRO NUMBER:: 13054628
SPECIMEN QUALITY:: ADEQUATE

## 2022-02-03 DIAGNOSIS — R208 Other disturbances of skin sensation: Secondary | ICD-10-CM | POA: Insufficient documentation

## 2022-02-03 DIAGNOSIS — N3000 Acute cystitis without hematuria: Secondary | ICD-10-CM | POA: Insufficient documentation

## 2022-02-03 NOTE — Progress Notes (Signed)
Your urine culture sensitivity came back and it shows that the medication you are currently being treated with is appropriate for your current urinary tract infection.

## 2022-02-03 NOTE — Assessment & Plan Note (Signed)
Urinary culture ordered and pending results

## 2022-02-03 NOTE — Assessment & Plan Note (Signed)
Suspected UTI will treat with Bactrim over the weekend is symptomatic.  Pending urine culture to determine antibiotic sensitivity increase oral hydration

## 2022-03-12 ENCOUNTER — Encounter: Payer: Self-pay | Admitting: Family Medicine

## 2022-03-15 ENCOUNTER — Other Ambulatory Visit: Payer: Self-pay | Admitting: Family Medicine

## 2022-03-18 DIAGNOSIS — N951 Menopausal and female climacteric states: Secondary | ICD-10-CM | POA: Diagnosis not present

## 2022-03-18 DIAGNOSIS — R5383 Other fatigue: Secondary | ICD-10-CM | POA: Diagnosis not present

## 2022-03-18 MED ORDER — PANTOPRAZOLE SODIUM 40 MG PO TBEC: DELAYED_RELEASE_TABLET | ORAL | 1 refills | Status: DC

## 2022-03-18 NOTE — Addendum Note (Signed)
Addended by: Tammi Sou on: 03/18/2022 09:06 AM ? ? Modules accepted: Orders ? ?

## 2022-03-20 DIAGNOSIS — Z6827 Body mass index (BMI) 27.0-27.9, adult: Secondary | ICD-10-CM | POA: Diagnosis not present

## 2022-03-20 DIAGNOSIS — R5383 Other fatigue: Secondary | ICD-10-CM | POA: Diagnosis not present

## 2022-03-20 DIAGNOSIS — G479 Sleep disorder, unspecified: Secondary | ICD-10-CM | POA: Diagnosis not present

## 2022-03-20 DIAGNOSIS — N951 Menopausal and female climacteric states: Secondary | ICD-10-CM | POA: Diagnosis not present

## 2022-06-06 ENCOUNTER — Ambulatory Visit (INDEPENDENT_AMBULATORY_CARE_PROVIDER_SITE_OTHER): Payer: PPO | Admitting: Family Medicine

## 2022-06-06 ENCOUNTER — Ambulatory Visit: Payer: PPO | Admitting: Family

## 2022-06-06 ENCOUNTER — Encounter: Payer: Self-pay | Admitting: Family Medicine

## 2022-06-06 DIAGNOSIS — R499 Unspecified voice and resonance disorder: Secondary | ICD-10-CM

## 2022-06-06 MED ORDER — BENZONATATE 200 MG PO CAPS: 200.0000 mg | ORAL_CAPSULE | Freq: Three times a day (TID) | ORAL | 1 refills | Status: DC | PRN

## 2022-06-06 NOTE — Progress Notes (Unsigned)
She had a cold "or something" about 2 weeks ago.  No fever but coughing and sneezing.  Fatigue.  Then had laryngitis.  Her voice is still a little hoarse and her throat still feels irritated.  She can still swallow but it doesn't feel right.  No wheeze.  No FCNAVD.  No rash.  Her voice is making some progress.  No facial pain but R ear still feels blocked.  Muffled hearing R ear.  She felt well enough to play pickleball this AM.    Meds, vitals, and allergies reviewed.   ROS: Per HPI unless specifically indicated in ROS section   Nad Ncat Whisper intact, weber wnl B, air>bone B.  TM wnl B Minimal OP irritation, minimal nasal congestion.   Neck supple, no LA Rrr Ctab Skin well perfused. No stridor.  No wheeze. No neck asymmetry seen.

## 2022-06-06 NOTE — Patient Instructions (Addendum)
Voice rest, gargle with salt water.   Tessalon if needed for cough.   Take care.  Glad to see you. Update [place] as needed.

## 2022-06-08 DIAGNOSIS — R499 Unspecified voice and resonance disorder: Secondary | ICD-10-CM | POA: Insufficient documentation

## 2022-06-08 NOTE — Assessment & Plan Note (Signed)
Likely postinfectious symptoms that should resolve.  Discussed options Voice rest, gargle with salt water.   Tessalon if needed for cough.   Update [place] as needed.  No sign of ominous process.

## 2022-06-17 DIAGNOSIS — N951 Menopausal and female climacteric states: Secondary | ICD-10-CM | POA: Diagnosis not present

## 2022-06-17 DIAGNOSIS — E039 Hypothyroidism, unspecified: Secondary | ICD-10-CM | POA: Diagnosis not present

## 2022-06-19 DIAGNOSIS — L820 Inflamed seborrheic keratosis: Secondary | ICD-10-CM | POA: Diagnosis not present

## 2022-06-19 DIAGNOSIS — R232 Flushing: Secondary | ICD-10-CM | POA: Diagnosis not present

## 2022-06-19 DIAGNOSIS — L570 Actinic keratosis: Secondary | ICD-10-CM | POA: Diagnosis not present

## 2022-06-19 DIAGNOSIS — Z6827 Body mass index (BMI) 27.0-27.9, adult: Secondary | ICD-10-CM | POA: Diagnosis not present

## 2022-06-19 DIAGNOSIS — Z08 Encounter for follow-up examination after completed treatment for malignant neoplasm: Secondary | ICD-10-CM | POA: Diagnosis not present

## 2022-06-19 DIAGNOSIS — L821 Other seborrheic keratosis: Secondary | ICD-10-CM | POA: Diagnosis not present

## 2022-06-19 DIAGNOSIS — L814 Other melanin hyperpigmentation: Secondary | ICD-10-CM | POA: Diagnosis not present

## 2022-06-19 DIAGNOSIS — E039 Hypothyroidism, unspecified: Secondary | ICD-10-CM | POA: Diagnosis not present

## 2022-06-19 DIAGNOSIS — N951 Menopausal and female climacteric states: Secondary | ICD-10-CM | POA: Diagnosis not present

## 2022-06-19 DIAGNOSIS — L578 Other skin changes due to chronic exposure to nonionizing radiation: Secondary | ICD-10-CM | POA: Diagnosis not present

## 2022-06-19 DIAGNOSIS — D1801 Hemangioma of skin and subcutaneous tissue: Secondary | ICD-10-CM | POA: Diagnosis not present

## 2022-06-19 DIAGNOSIS — Z85828 Personal history of other malignant neoplasm of skin: Secondary | ICD-10-CM | POA: Diagnosis not present

## 2022-06-19 DIAGNOSIS — N898 Other specified noninflammatory disorders of vagina: Secondary | ICD-10-CM | POA: Diagnosis not present

## 2022-07-17 DIAGNOSIS — H35371 Puckering of macula, right eye: Secondary | ICD-10-CM | POA: Diagnosis not present

## 2022-07-23 ENCOUNTER — Other Ambulatory Visit: Payer: Self-pay | Admitting: Family Medicine

## 2022-07-23 DIAGNOSIS — Z1231 Encounter for screening mammogram for malignant neoplasm of breast: Secondary | ICD-10-CM

## 2022-07-29 ENCOUNTER — Other Ambulatory Visit: Payer: Self-pay | Admitting: Family Medicine

## 2022-08-05 ENCOUNTER — Encounter: Payer: Self-pay | Admitting: Family Medicine

## 2022-08-05 ENCOUNTER — Ambulatory Visit (INDEPENDENT_AMBULATORY_CARE_PROVIDER_SITE_OTHER): Payer: PPO | Admitting: Family Medicine

## 2022-08-05 VITALS — BP 134/80 | HR 83 | Temp 97.8°F | Ht 64.0 in | Wt 159.5 lb

## 2022-08-05 DIAGNOSIS — L603 Nail dystrophy: Secondary | ICD-10-CM | POA: Diagnosis not present

## 2022-08-05 DIAGNOSIS — R5383 Other fatigue: Secondary | ICD-10-CM

## 2022-08-05 DIAGNOSIS — R8290 Unspecified abnormal findings in urine: Secondary | ICD-10-CM | POA: Diagnosis not present

## 2022-08-05 DIAGNOSIS — N3000 Acute cystitis without hematuria: Secondary | ICD-10-CM

## 2022-08-05 DIAGNOSIS — L659 Nonscarring hair loss, unspecified: Secondary | ICD-10-CM | POA: Diagnosis not present

## 2022-08-05 DIAGNOSIS — R7989 Other specified abnormal findings of blood chemistry: Secondary | ICD-10-CM

## 2022-08-05 DIAGNOSIS — E538 Deficiency of other specified B group vitamins: Secondary | ICD-10-CM

## 2022-08-05 LAB — COMPREHENSIVE METABOLIC PANEL
ALT: 15 U/L (ref 0–35)
AST: 18 U/L (ref 0–37)
Albumin: 4.1 g/dL (ref 3.5–5.2)
Alkaline Phosphatase: 125 U/L — ABNORMAL HIGH (ref 39–117)
BUN: 8 mg/dL (ref 6–23)
CO2: 27 mEq/L (ref 19–32)
Calcium: 9.4 mg/dL (ref 8.4–10.5)
Chloride: 101 mEq/L (ref 96–112)
Creatinine, Ser: 0.86 mg/dL (ref 0.40–1.20)
GFR: 65.87 mL/min (ref >60.00)
Glucose, Bld: 96 mg/dL (ref 70–99)
Potassium: 5.1 mEq/L (ref 3.5–5.1)
Sodium: 136 mEq/L (ref 135–145)
Total Bilirubin: 0.5 mg/dL (ref 0.2–1.2)
Total Protein: 6.6 g/dL (ref 6.0–8.3)

## 2022-08-05 LAB — POC URINALSYSI DIPSTICK (AUTOMATED)
Bilirubin, UA: NEGATIVE
Blood, UA: NEGATIVE
Glucose, UA: NEGATIVE
Ketones, UA: NEGATIVE
Nitrite, UA: POSITIVE
Protein, UA: POSITIVE — AB
Spec Grav, UA: 1.01 (ref 1.010–1.025)
Urobilinogen, UA: 0.2 E.U./dL
pH, UA: 6 (ref 5.0–8.0)

## 2022-08-05 LAB — CBC WITH DIFFERENTIAL/PLATELET
Basophils Absolute: 0 10*3/uL (ref 0.0–0.1)
Basophils Relative: 0.5 % (ref 0.0–3.0)
Eosinophils Absolute: 0.2 10*3/uL (ref 0.0–0.7)
Eosinophils Relative: 3.6 % (ref 0.0–5.0)
HCT: 37.4 % (ref 36.0–46.0)
Hemoglobin: 12.4 g/dL (ref 12.0–15.0)
Lymphocytes Relative: 23.8 % (ref 12.0–46.0)
Lymphs Abs: 1.6 10*3/uL (ref 0.7–4.0)
MCHC: 33 g/dL (ref 30.0–36.0)
MCV: 85.2 fl (ref 78.0–100.0)
Monocytes Absolute: 0.5 10*3/uL (ref 0.1–1.0)
Monocytes Relative: 6.9 % (ref 3.0–12.0)
Neutro Abs: 4.3 10*3/uL (ref 1.4–7.7)
Neutrophils Relative %: 65.2 % (ref 43.0–77.0)
Platelets: 303 10*3/uL (ref 150.0–400.0)
RBC: 4.39 Mil/uL (ref 3.87–5.11)
RDW: 13.1 % (ref 11.5–15.5)
WBC: 6.6 10*3/uL (ref 4.0–10.5)

## 2022-08-05 LAB — FERRITIN: Ferritin: 48.1 ng/mL (ref 10.0–291.0)

## 2022-08-05 LAB — VITAMIN B12: Vitamin B-12: 303 pg/mL (ref 211–911)

## 2022-08-05 LAB — IRON: Iron: 70 ug/dL (ref 42–145)

## 2022-08-05 LAB — TSH: TSH: 0.92 u[IU]/mL (ref 0.35–5.50)

## 2022-08-05 MED ORDER — SULFAMETHOXAZOLE-TRIMETHOPRIM 800-160 MG PO TABS: 1.0000 | ORAL_TABLET | Freq: Two times a day (BID) | ORAL | 0 refills | Status: DC

## 2022-08-05 NOTE — Assessment & Plan Note (Signed)
Positive ua cx pending  Bactrim px - bid 5 d Fluids recommended inst to call if suddenly worse Handout given

## 2022-08-05 NOTE — Patient Instructions (Addendum)
Take bactrim as directed  We will call with culture result  If symptoms suddenly worsen let us know   Let us know what supplement you take from [facility] for thyroid   Labs today for fatigue/hair/skin   Eat a balanced diet with healthy fats (nuts and avacados)

## 2022-08-05 NOTE — Progress Notes (Signed)
Subjective:    Patient ID: Mary Gordon, female    DOB: 1946/06/11, 76 y.o.   MRN: [id number]  HPI Pt presents for urinary symptoms and hair and nail problems   Wt Readings from Last 3 Encounters:  08/05/22 159 lb 8 oz (72.3 kg)  06/06/22 158 lb (71.7 kg)  01/31/22 165 lb (74.8 kg)   27.38 kg/m  Urinary odor and cloudy now  No dysuria  Some frequency /urgency No blood  No new flank pain   Results for orders placed or performed in visit on 08/05/22  POCT Urinalysis Dipstick (Automated)  Result Value Ref Range   Color, UA Light Yellow    Clarity, UA Cloudy    Glucose, UA Negative Negative   Bilirubin, UA Negative    Ketones, UA Negative    Spec Grav, UA 1.010 1.010 - 1.025   Blood, UA Negative    pH, UA 6.0 5.0 - 8.0   Protein, UA Positive (A) Negative   Urobilinogen, UA 0.2 0.2 or 1.0 E.U./dL   Nitrite, UA Positive    Leukocytes, UA Large (3+) (A) Negative    Last urine culture -e coli pan sensitive in February She cannot take macrobid and augmentin   Drinking lots of water   Lab Results  Component Value Date   CREATININE 0.93 10/09/2021   BUN 9 10/09/2021   NA 138 10/09/2021   K 4.6 10/09/2021   CL 101 10/09/2021   CO2 31 10/09/2021   Hair is coming out more  Nails are peeling and breaking   Feels tired a lot    Does dishes- not excessive  No swimming   Lab Results  Component Value Date   TSH 2.17 10/09/2021    She takes some sort of thyroid prep from [facility]?  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   Rosacea 12/27/2014   Pulmonary HTN ([place]) 10/20/2014   DOE (dyspnea on exertion) 09/26/2014   PVCs (premature ventricular contractions) 09/26/2014   Encounter for Medicare annual wellness exam 03/18/2014   Fatigue 05/31/2012   Fibromyalgia 05/31/2012   Special screening for malignant neoplasms, colon 09/26/2011   GERD (gastroesophageal reflux disease) 07/08/2011   CERVICAL RADICULOPATHY, RIGHT 09/04/2010   PULMONARY NODULE 09/14/2009   PALPITATIONS 08/17/2009   Vitamin D deficiency 02/22/2009   HYPERCHOLESTEROLEMIA 02/22/2009   Adjustment disorder with mixed anxiety and depressed mood 02/22/2009   ALLERGIC RHINITIS 02/22/2009   MENOPAUSAL SYNDROME 02/22/2009   Osteopenia 02/22/2009   Past Medical History:  Diagnosis Date   Allergy    all year   Anxiety    Bursitis of hip    Cataract    Cervical cancer ([place]) 1978   Chronic kidney disease    RENAL INSUFF   Depression    Dysrhythmia    PVC'S   Fibromyalgia    GERD (gastroesophageal reflux disease)    High  cholesterol    History of Clostridium difficile infection    In past from augmentin   History of hiatal hernia    Osteopenia    Palpitations    Panic disorder    Pulmonary HTN ([place]) 10/20/2014   moderate with PASP 75mHg   Pulmonary nodule    Being observed   Vitamin D deficiency    Past Surgical History:  Procedure Laterality Date   ABDOMINAL HYSTERECTOMY  1978   CARDIAC CATHETERIZATION     CATARACT EXTRACTION W/PHACO Right 07/14/2017   Procedure: CATARACT EXTRACTION PHACO AND INTRAOCULAR LENS PLACEMENT ([place];  Surgeon: PBirder Robson MD;  Location: [facility] ORS;  Service: Ophthalmology;  Laterality: Right;  [place]:31 AP% 15.7 CDE 4.97 Fluid pack lot # [id number]   CATARACT EXTRACTION W/PHACO Left 08/04/2017   Procedure: CATARACT EXTRACTION PHACO AND  INTRAOCULAR LENS PLACEMENT (IOC);  Surgeon: PBirder Robson MD;  Location: [facility] ORS;  Service: Ophthalmology;  Laterality: Left;  [place]:34 AP% 18.7 CDE 6.42 Fluid pack lot # [id number]   COLONOSCOPY  2002   Normal   HAND SURGERY  2005   Left hand thrombosis   NASAL HEMORRHAGE CONTROL  2007   RIGHT HEART CATHETERIZATION N/A 03/16/2015   Procedure: RIGHT HEART CATH;  Surgeon: DLarey Dresser MD;  [facility];  Service: Cardiovascular;  Laterality: N/A;   SHOULDER SURGERY  2008   Right shoulder bone spur   THYROGLOSSAL DUCT CYST     Social History   Tobacco Use   Smoking status: Former    Packs/day: 1.00    Years: 17.00    Total pack years: 17.00    Types: Cigarettes    Quit date: 12/08/1978    Years since quitting: 43.6   Smokeless tobacco: Never  Vaping Use   Vaping Use: Never used  Substance Use Topics   Alcohol use: No    Alcohol/week: 0.0 standard drinks of alcohol   Drug use: No   Family History  Problem Relation Age of Onset   Heart attack Mother 6101  Emphysema Mother    Allergies Mother    Asthma Mother    Allergies Father    Glaucoma Father    Arrhythmia Sister        Atrial fibrillation   Cancer Sister        breast   Breast cancer Sister    Emphysema Sister    Heart disease Sister    Rheumatologic disease Sister    Lung cancer Sister    Diabetes Other        Parent   Hyperlipidemia Other        Other relative   Hypertension Other        Parent, other relative   Breast cancer Other        Other relative, sister   Allergies Other        Siblings   Asthma Other        Sisters   Colon cancer Neg Hx    Esophageal cancer Neg Hx    Rectal cancer Neg Hx    Stomach cancer Neg Hx    Allergies  Allergen Reactions   Adhesive [Tape] Rash and Other (See Comments)    "Burns" the skin   Amoxicillin-Pot Clavulanate Other (See Comments)    Resulted in C-DIFF!!!!   Macrobid [Nitrofurantoin] Diarrhea    Severe abdominal cramping   Simvastatin  Other (See Comments)    Caused severe muscle pain   Current Outpatient Medications  on File Prior to Visit  Medication Sig Dispense Refill   benzonatate (TESSALON) 200 MG capsule Take 1 capsule (200 mg total) by mouth 3 (three) times daily as needed. 30 capsule 1   chlorpheniramine (CHLOR-TRIMETON) 4 MG tablet Take 8 mg by mouth 2 (two) times daily.     famotidine (PEPCID) 20 MG tablet TAKE 1 TABLET BY MOUTH TWICE A DAY 180 tablet 2   NONFORMULARY OR COMPOUNDED ITEM compound testosterone cream     OVER THE COUNTER MEDICATION Take 5-7 mLs by mouth See admin instructions. Life Tones (uric acid cleanser- has celery extract and other herbs) liquid: Take 5-7 ml's by mouth once a day     pantoprazole (PROTONIX) 40 MG tablet TAKE 1 TABLET BY MOUTH EVERY DAY AS NEEDED FOR REFLUX 90 tablet 1   progesterone (PROMETRIUM) 100 MG capsule Take 100 mg by mouth at bedtime.     rosuvastatin (CRESTOR) 5 MG tablet TAKE 1 TABLET BY MOUTH TWICE A WEEK ON MONDAY/THURSDAY 24 tablet 2   sertraline (ZOLOFT) 50 MG tablet TAKE 1 TABLET BY MOUTH EVERY DAY 90 tablet 3   traZODone (DESYREL) 100 MG tablet TAKE 1 TABLET BY MOUTH EVERYDAY AT BEDTIME 90 tablet 3   No current facility-administered medications on file prior to visit.     Review of Systems  Constitutional:  Positive for fatigue. Negative for activity change, appetite change, fever and unexpected weight change.  HENT:  Negative for congestion, ear pain, rhinorrhea, sinus pressure and sore throat.   Eyes:  Negative for pain, redness and visual disturbance.  Respiratory:  Negative for cough, shortness of breath and wheezing.   Cardiovascular:  Negative for chest pain and palpitations.  Gastrointestinal:  Negative for abdominal pain, blood in stool, constipation and diarrhea.  Endocrine: Negative for polydipsia and polyuria.  Genitourinary:  Negative for dysuria, frequency and urgency.  Musculoskeletal:  Negative for arthralgias, back pain and myalgias.  Skin:   Negative for color change, pallor and rash.       Dry Brittle nails  More diffuse hairs lost when brusing  Allergic/Immunologic: Negative for environmental allergies.  Neurological:  Negative for dizziness, syncope and headaches.  Hematological:  Negative for adenopathy. Does not bruise/bleed easily.  Psychiatric/Behavioral:  Negative for decreased concentration and dysphoric mood. The patient is not nervous/anxious.        Objective:   Physical Exam Constitutional:      General: She is not in acute distress.    Appearance: Normal appearance. She is well-developed and normal weight. She is not ill-appearing or diaphoretic.  HENT:     Head: Normocephalic and atraumatic.     Mouth/Throat:     Mouth: Mucous membranes are moist.  Eyes:     General:        Right eye: No discharge.        Left eye: No discharge.     Conjunctiva/sclera: Conjunctivae normal.     Pupils: Pupils are equal, round, and reactive to light.  Neck:     Thyroid: No thyromegaly.     Vascular: No carotid bruit or JVD.  Cardiovascular:     Rate and Rhythm: Normal rate and regular rhythm.     Heart sounds: Normal heart sounds.     No gallop.  Pulmonary:     Effort: Pulmonary effort is normal. No respiratory distress.     Breath sounds: Normal breath sounds. No wheezing, rhonchi or rales.  Abdominal:     General: There is no distension or  abdominal bruit.     Palpations: Abdomen is soft.     Tenderness: There is no abdominal tenderness. There is no right CVA tenderness, left CVA tenderness, guarding or rebound.     Comments: No suprapubic tenderness or fullness    Musculoskeletal:     Cervical back: Normal range of motion and neck supple. No tenderness.     Right lower leg: No edema.     Left lower leg: No edema.  Lymphadenopathy:     Cervical: No cervical adenopathy.  Skin:    General: Skin is warm and dry.     Coloration: Skin is not pale.     Findings: No rash.  Neurological:     Mental Status: She  is alert.     Coordination: Coordination normal.     Deep Tendon Reflexes: Reflexes are normal and symmetric. Reflexes normal.  Psychiatric:        Mood and Affect: Mood normal.           Assessment & Plan:   Problem List Items Addressed This Visit       Musculoskeletal and Integument   Brittle nails    Adv to avoid chemicals and prolonged water contact  Lab today       Relevant Orders   Comprehensive metabolic panel   TSH   Vitamin B12   CBC with Differential/Platelet   Iron   Ferritin     Genitourinary   Acute cystitis without hematuria - Primary    Positive ua cx pending  Bactrim px - bid 5 d Fluids recommended inst to call if suddenly worse Handout given      Relevant Orders   Urine Culture     Other   Fatigue    Lab today  Is taking a [facility] (? Thyroid) treatment and hrt compounded   Mood is good        Relevant Orders   Comprehensive metabolic panel   TSH   Vitamin B12   CBC with Differential/Platelet   Iron   Ferritin   Hair loss    Not focal  No scalp changes today  Lab ordered incl tsh        Relevant Orders   Comprehensive metabolic panel   TSH   Vitamin B12   CBC with Differential/Platelet   Iron   Ferritin   Low vitamin B12 level    Years ago  Now more hair/skin changes and fatigue  B12 level today      Relevant Orders   Vitamin B12   Other Visit Diagnoses     Cloudy urine       Relevant Orders   POCT Urinalysis Dipstick (Automated) (Completed)

## 2022-08-05 NOTE — Assessment & Plan Note (Signed)
Years ago  Now more hair/skin changes and fatigue  B12 level today

## 2022-08-05 NOTE — Assessment & Plan Note (Signed)
Adv to avoid chemicals and prolonged water contact  Lab today

## 2022-08-05 NOTE — Assessment & Plan Note (Signed)
Lab today  Is taking a [facility] (? Thyroid) treatment and hrt compounded   Mood is good

## 2022-08-05 NOTE — Assessment & Plan Note (Signed)
Not focal  No scalp changes today  Lab ordered incl tsh

## 2022-08-06 ENCOUNTER — Ambulatory Visit
Admission: RE | Admit: 2022-08-06 | Discharge: 2022-08-06 | Disposition: A | Discharge status: Home / Self Care | Payer: PPO | Source: Ambulatory Visit | Attending: Family Medicine | Admitting: Family Medicine

## 2022-08-06 DIAGNOSIS — Z1231 Encounter for screening mammogram for malignant neoplasm of breast: Secondary | ICD-10-CM

## 2022-08-08 LAB — URINE CULTURE
MICRO NUMBER:: 13846808
SPECIMEN QUALITY:: ADEQUATE

## 2022-09-16 DIAGNOSIS — N951 Menopausal and female climacteric states: Secondary | ICD-10-CM | POA: Diagnosis not present

## 2022-09-16 DIAGNOSIS — E039 Hypothyroidism, unspecified: Secondary | ICD-10-CM | POA: Diagnosis not present

## 2022-09-18 DIAGNOSIS — E039 Hypothyroidism, unspecified: Secondary | ICD-10-CM | POA: Diagnosis not present

## 2022-09-18 DIAGNOSIS — N951 Menopausal and female climacteric states: Secondary | ICD-10-CM | POA: Diagnosis not present

## 2022-09-18 DIAGNOSIS — Z6826 Body mass index (BMI) 26.0-26.9, adult: Secondary | ICD-10-CM | POA: Diagnosis not present

## 2022-09-18 DIAGNOSIS — N898 Other specified noninflammatory disorders of vagina: Secondary | ICD-10-CM | POA: Diagnosis not present

## 2022-09-18 DIAGNOSIS — R232 Flushing: Secondary | ICD-10-CM | POA: Diagnosis not present

## 2022-10-02 ENCOUNTER — Other Ambulatory Visit: Payer: Self-pay | Admitting: Family Medicine

## 2022-10-07 ENCOUNTER — Telehealth: Payer: Self-pay | Admitting: Family Medicine

## 2022-10-07 NOTE — Telephone Encounter (Signed)
Left message for patient to call back and schedule Medicare Annual Wellness Visit (AWV) either virtually or phone  Left  my jabber number [phone]   Last AWV  10/09/21    45 min for awv-i and in office appointments 30 min for awv-s  phone/virtual appointments

## 2022-10-16 ENCOUNTER — Encounter: Payer: Self-pay | Admitting: Family Medicine

## 2022-10-16 ENCOUNTER — Telehealth: Payer: Self-pay | Admitting: Family Medicine

## 2022-10-16 ENCOUNTER — Telehealth (INDEPENDENT_AMBULATORY_CARE_PROVIDER_SITE_OTHER): Payer: PPO | Admitting: Family Medicine

## 2022-10-16 VITALS — Ht 64.0 in | Wt 152.0 lb

## 2022-10-16 DIAGNOSIS — U071 COVID-19: Secondary | ICD-10-CM | POA: Diagnosis not present

## 2022-10-16 MED ORDER — NIRMATRELVIR/RITONAVIR (PAXLOVID)TABLET: 3.0000 | ORAL_TABLET | Freq: Two times a day (BID) | ORAL | 0 refills | Status: AC

## 2022-10-16 NOTE — Progress Notes (Signed)
Virtual Visit via Video Note  I connected with Mary Gordon  on 10/16/22 at 12:20 PM EST by a video enabled telemedicine application and verified that I am speaking with the correct person using two identifiers.  Location patient: [place] Location provider:work or home office Persons participating in the virtual visit: patient, provider  I discussed the limitations and requested verbal permission for telemedicine visit. The patient expressed understanding and agreed to proceed.   HPI:  Acute telemedicine visit for Covid: -Onset: yesterday, tested positive yesterday -Symptoms include: nasal congestion, cough, sore throat, sinus pressure, body ache -Denies:fever, CP, SOB, NVD -Pertinent past medical history: see below, GFR was 65 2 months ago -Pertinent medication allergies: Allergies  Allergen Reactions   Adhesive [Tape] Rash and Other (See Comments)    "Burns" the skin   Amoxicillin-Pot Clavulanate Other (See Comments)    Resulted in C-DIFF!!!!   Macrobid [Nitrofurantoin] Diarrhea    Severe abdominal cramping   Simvastatin Other (See Comments)    Caused severe muscle pain  -COVID-19 vaccine status:  has had multiple covid vaccines, most recently about 6 months ago Immunization History  Administered Date(s) Administered   Fluad Quad(high Dose 65+) 09/02/2019, 09/07/2020   Influenza Split 09/26/2011, 10/15/2012   Influenza Whole 09/04/2010   Influenza, High Dose Seasonal PF 08/30/2021   Influenza,inj,Quad PF,6+ Mos 09/06/2013, 09/08/2014, 09/17/2015, 08/18/2016, 08/19/2017, 11/18/2018   PFIZER Comirnaty(Gray Top)Covid-19 Tri-Sucrose Vaccine 03/22/2021   PFIZER(Purple Top)SARS-COV-2 Vaccination 01/30/2020, 02/20/2020, 09/16/2020   Pneumococcal Conjugate-13 08/25/2016   Pneumococcal Polysaccharide-23 10/07/2008, 08/19/2017   Td 08/08/2009   Tdap 09/20/2020   Zoster Recombinat (Shingrix) 01/20/2022   Zoster, Live 12/08/2008     ROS: See pertinent positives and negatives per  HPI.  Past Medical History:  Diagnosis Date   Allergy    all year   Anxiety    Bursitis of hip    Cataract    Cervical cancer ([place]) 1978   Chronic kidney disease    RENAL INSUFF   Depression    Dysrhythmia    PVC'S   Fibromyalgia    GERD (gastroesophageal reflux disease)    High cholesterol    History of Clostridium difficile infection    In past from augmentin   History of hiatal hernia    Osteopenia    Palpitations    Panic disorder    Pulmonary HTN ([place]) 10/20/2014   moderate with PASP 58mHg   Pulmonary nodule    Being observed   Vitamin D deficiency     Past Surgical History:  Procedure Laterality Date   ABDOMINAL HYSTERECTOMY  1978   CARDIAC CATHETERIZATION     CATARACT EXTRACTION W/PHACO Right 07/14/2017   Procedure: CATARACT EXTRACTION PHACO AND INTRAOCULAR LENS PLACEMENT ([place];  Surgeon: PBirder Robson MD;  Location: [facility] ORS;  Service: Ophthalmology;  Laterality: Right;  [place]:31 AP% 15.7 CDE 4.97 Fluid pack lot # [id number]   CATARACT EXTRACTION W/PHACO Left 08/04/2017   Procedure: CATARACT EXTRACTION PHACO AND INTRAOCULAR LENS PLACEMENT (I[facility]);  Surgeon: PBirder Robson MD;  Location: [facility] ORS;  Service: Ophthalmology;  Laterality: Left;  [place]:34 AP% 18.7 CDE 6.42 Fluid pack lot # [id number]   COLONOSCOPY  2002   Normal   HAND SURGERY  2005   Left hand thrombosis   NASAL HEMORRHAGE CONTROL  2007   RIGHT HEART CATHETERIZATION N/A 03/16/2015   Procedure: RIGHT HEART CATH;  Surgeon: DLarey Dresser MD;  [facility];  Service: Cardiovascular;  Laterality: N/A;   SHOULDER SURGERY  2008  Right shoulder bone spur   THYROGLOSSAL DUCT CYST       Current Outpatient Medications:    benzonatate (TESSALON) 200 MG capsule, Take 1 capsule (200 mg total) by mouth 3 (three) times daily as needed., Disp: 30 capsule, Rfl: 1   chlorpheniramine (CHLOR-TRIMETON) 4 MG tablet, Take 8 mg by mouth 2 (two) times daily., Disp: , Rfl:    famotidine (PEPCID) 20 MG  tablet, TAKE 1 TABLET BY MOUTH TWICE A DAY, Disp: 180 tablet, Rfl: 2   nirmatrelvir/ritonavir EUA (PAXLOVID) 20 x 150 MG & 10 x '100MG'$  TABS, Take 3 tablets by mouth 2 (two) times daily for 5 days. (Take nirmatrelvir 150 mg two tablets twice daily for 5 days and ritonavir 100 mg one tablet twice daily for 5 days) Patient GFR is > 65, Disp: 30 tablet, Rfl: 0   NONFORMULARY OR COMPOUNDED ITEM, compound testosterone cream, Disp: , Rfl:    OVER THE COUNTER MEDICATION, Take 5-7 mLs by mouth See admin instructions. Life Tones (uric acid cleanser- has celery extract and other herbs) liquid: Take 5-7 ml's by mouth once a day, Disp: , Rfl:    pantoprazole (PROTONIX) 40 MG tablet, TAKE 1 TABLET BY MOUTH EVERY DAY AS NEEDED FOR REFLUX, Disp: 90 tablet, Rfl: 0   progesterone (PROMETRIUM) 100 MG capsule, Take 100 mg by mouth at bedtime., Disp: , Rfl:    rosuvastatin (CRESTOR) 5 MG tablet, TAKE 1 TABLET BY MOUTH TWICE A WEEK ON MONDAY/THURSDAY, Disp: 24 tablet, Rfl: 2   sertraline (ZOLOFT) 50 MG tablet, TAKE 1 TABLET BY MOUTH EVERY DAY, Disp: 90 tablet, Rfl: 3   traZODone (DESYREL) 100 MG tablet, TAKE 1 TABLET BY MOUTH EVERYDAY AT BEDTIME, Disp: 90 tablet, Rfl: 3  EXAM:  VITALS per patient if applicable:  GENERAL: alert, oriented, appears well and in no acute distress  HEENT: atraumatic, conjunttiva clear, no obvious abnormalities on inspection of external nose and ears  NECK: normal movements of the head and neck  LUNGS: on inspection no signs of respiratory distress, breathing rate appears normal, no obvious gross SOB, gasping or wheezing  CV: no obvious cyanosis  MS: moves all visible extremities without noticeable abnormality  PSYCH/NEURO: pleasant and cooperative, no obvious depression or anxiety, speech and thought processing grossly intact  ASSESSMENT AND PLAN:  Discussed the following assessment and plan:  COVID-19   Discussed treatment options, side effect and risk of drug interactions,  ideal treatment window, potential complications, isolation and precautions for COVID-19.  Discussed possibility of rebound with or without antivirals. Checked for/reviewed last GFR - listed in HPI if available. After lengthy discussion, the patient opted for treatment with Paxlovid due to being higher risk for complications of covid or severe disease and other factors. Discussed EUA status of this drug and the fact that there is preliminary limited knowledge of risks/interactions/side effects per EUA document vs possible benefits and precautions. Discussed interactions with her medications and she was advised to hold the statin for 8 days and take 1/2 dose of the trazadone and monitor for s/s of interactions for 8 days. This information was shared with patient during the visit and also was provided in patient instructions. Also, advised that patient discuss risks/interactions and use with pharmacist/treatment team as well.  Other symptomatic care measures summarized in patient instructions. Advised to seek prompt virtual visit or in person care if worsening, new symptoms arise, or if is not improving with treatment as expected per our conversation of expected course. Discussed options for follow  up care. Did let this patient know that I do telemedicine on Tuesdays and Thursdays for [place] and those are the days I am logged into the system. Advised to schedule follow up visit with PCP, [place] virtual visits or [facility] if any further questions or concerns to avoid delays in care.   I discussed the assessment and treatment plan with the patient. The patient was provided an opportunity to ask questions and all were answered. The patient agreed with the plan and demonstrated an understanding of the instructions.     Lucretia Kern, DO

## 2022-10-16 NOTE — Telephone Encounter (Signed)
Patient called in and stated that she tested positive for Covid. Informed patient she will need a visit with a provider and offered her a virtual visit but patient declined stating she doesn't feel like doing anything. She would like for Dr. Glori Bickers to call something in for her. Please advise. Thank you!

## 2022-10-16 NOTE — Patient Instructions (Signed)
HOME CARE TIPS:  -COVID19 testing information: [email or web address]  Most pharmacies also offer testing and home test kits. If the Covid19 test is positive and you desire antiviral treatment, please contact a [facility] or schedule a follow up virtual visit through your primary care office or through the [facility].  Other test to treat options: [email or web address]  -I sent the medication(s) we discussed to your pharmacy: Meds ordered this encounter  Medications   nirmatrelvir/ritonavir EUA (PAXLOVID) 20 x 150 MG & 10 x '100MG'$  TABS    Sig: Take 3 tablets by mouth 2 (two) times daily for 5 days. (Take nirmatrelvir 150 mg two tablets twice daily for 5 days and ritonavir 100 mg one tablet twice daily for 5 days) Patient GFR is > 65    Dispense:  30 tablet    Refill:  0     -I sent in the [facility] treatment or referral you requested per our discussion. Please see the information provided below and discuss further with the pharmacist/treatment team.  -If taking Paxlovid, please review all medications, supplement and over the counter drugs with your pharmacist and ask them to check for any interactions. Please make the following changes to your regular medications while taking Paxlovid: *STOP your crestor (rosuvastatin) while taking paxlovid and restart 3 days after finishing paxlovid *decrease your trazadone to '50mg'$  (1/2 tablet) once daily while on paxlovid and until 4 days after finishing paxlovid.   -there is a chance of rebound illness with covid after improving. This can happen whether or not you take an antiviral treatment. If you become sick again with covid after getting better, please schedule a follow up virtual visit and isolate again.  -can use tylenol if needed for fevers, aches and pains per instructions  -nasal saline sinus rinses twice daily  -stay hydrated, drink plenty of fluids and eat small healthy  meals - avoid dairy  -can take 1000 IU (23mg) Vit D3 and 100-500 mg of Vit C daily per instructions  -follow up with your doctor in 2-3 days unless improving and feeling better  -stay home while sick, except to seek medical care. If you have COVID19, you will likely be contagious for 7-10 days. Flu or Influenza is likely contagious for about 7 days. Other respiratory viral infections remain contagious for 5-10+ days depending on the virus and many other factors. Wear a good mask that fits snugly (such as N95 or KN95) if around others to reduce the risk of transmission.  It was nice to meet you today, and I really hope you are feeling better soon. I help [facility] out with telemedicine visits on Tuesdays and Thursdays and am happy to help if you need a follow up virtual visit on those days. Otherwise, if you have any concerns or questions following this visit please schedule a follow up visit with your Primary Care doctor or seek care at a local urgent care clinic to avoid delays in care.    Seek in person care or schedule a follow up video visit promptly if your symptoms worsen, new concerns arise or you are not improving with treatment. Call 911 and/or seek emergency care if your symptoms are severe or life threatening.   See the following link for the most recent information regarding Paxlovid:  [email or web address]   Nirmatrelvir; Ritonavir Tablets What is this medication? NIRMATRELVIR; RITONAVIR (NIR ma TREL vir; ri TOE na veer) treats mild to moderate COVID-19. It may help people who are at high risk of developing severe illness.  This medication works by limiting the spread of the virus in your body. The FDA has allowed the emergency use of this medication. This medicine may be used for other purposes; ask your health care provider or pharmacist if you have questions. COMMON BRAND NAME(S): PAXLOVID What should I tell my care team before I take this medication? They need to know if you  have any of these conditions: Any allergies Any serious illness Kidney disease Liver disease An unusual or allergic reaction to nirmatrelvir, ritonavir, other medications, foods, dyes, or preservatives Pregnant or trying to get pregnant Breast-feeding How should I use this medication? This product contains 2 different medications that are packaged together. For the standard dose, take 2 pink tablets of nirmatrelvir with 1 white tablet of ritonavir (3 tablets total) by mouth with water twice daily. Talk to your care team if you have kidney disease. You may need a different dose. Swallow the tablets whole. You can take it with or without food. If it upsets your stomach, take it with food. Take all of this medication unless your care team tells you to stop it early. Keep taking it even if you think you are better. Talk to your care team about the use of this medication in children. While it may be prescribed for children as young as 12 years for selected conditions, precautions do apply. Overdosage: If you think you have taken too much of this medicine contact a poison control center or emergency room at once. NOTE: This medicine is only for you. Do not share this medicine with others. What if I miss a dose? If you miss a dose, take it as soon as you can unless it is more than 8 hours late. If it is more than 8 hours late, skip the missed dose. Take the next dose at the normal time. Do not take extra or 2 doses at the same time to make up for the missed dose. What may interact with this medication? Do not take this medication with any of the following medications: Alfuzosin Certain medications for anxiety or sleep like midazolam, triazolam Certain medications for cancer like apalutamide, enzalutamide Certain medications for cholesterol like lovastatin, simvastatin Certain medications for irregular heart beat like amiodarone, dronedarone, flecainide, propafenone, quinidine Certain medications for  pain like meperidine, piroxicam Certain medications for psychotic disorders like clozapine, lurasidone, pimozide Certain medications for seizures like carbamazepine, phenobarbital, phenytoin Colchicine Eletriptan Eplerenone Ergot alkaloids like dihydroergotamine, ergonovine, ergotamine, methylergonovine Finerenone Flibanserin Ivabradine Lomitapide Naloxegol Ranolazine Rifampin Sildenafil Silodosin [facility]. John's Wort Tolvaptan Ubrogepant Voclosporin This medication may also interact with the following medications: Bedaquiline Birth control pills Bosentan Certain antibiotics like erythromycin or clarithromycin Certain medications for blood pressure like amlodipine, diltiazem, felodipine, nicardipine, nifedipine Certain medications for cancer like abemaciclib, ceritinib, dasatinib, encorafenib, ibrutinib, ivosidenib, neratinib, nilotinib, venetoclax, vinblastine, vincristine Certain medications for cholesterol like atorvastatin, rosuvastatin Certain medications for depression like bupropion, trazodone Certain medications for fungal infections like isavuconazonium, itraconazole, ketoconazole, voriconazole Certain medications for hepatitis C like elbasvir; grazoprevir, dasabuvir; ombitasvir; paritaprevir; ritonavir, glecaprevir; pibrentasvir, sofosbuvir; velpatasvir; voxilaprevir Certain medications for HIV or AIDS Certain medications for irregular heartbeat like lidocaine Certain medications that treat or prevent blood clots like rivaroxaban, warfarin Digoxin Fentanyl Medications that lower your chance of fighting infection like cyclosporine, sirolimus, tacrolimus Methadone Quetiapine Rifabutin Salmeterol Steroid medications like betamethasone, budesonide, ciclesonide, dexamethasone, fluticasone, methylprednisone, mometasone, triamcinolone This list may not describe all possible interactions. Give your health care provider a list of all the medicines, herbs, non-prescription drugs,  or dietary supplements you use. Also tell them if you smoke, drink alcohol, or use illegal drugs. Some items may interact with your medicine. What should I watch for while using this medication? Your condition will be monitored carefully while you are receiving this medication. Visit your care team for regular checkups. Tell your care team if your symptoms do not start to get better or if they get worse. If you have untreated HIV infection, this medication may lead to some HIV medications not working as well in the future. Birth control may not work properly while you are taking this medication. Talk to your care team about using an extra method of birth control. What side effects may I notice from receiving this medication? Side effects that you should report to your care team as soon as possible: Allergic reactions--skin rash, itching, hives, swelling of the face, lips, tongue, or throat Liver injury--right upper belly pain, loss of appetite, nausea, light-colored stool, dark yellow or brown urine, yellowing skin or eyes, unusual weakness or fatigue Redness, blistering, peeling, or loosening of the skin, including inside the mouth Side effects that usually do not require medical attention (report these to your care team if they continue or are bothersome): Change in taste Diarrhea General discomfort and fatigue Increase in blood pressure Muscle pain Nausea Stomach pain This list may not describe all possible side effects. Call your doctor for medical advice about side effects. You may report side effects to FDA at 1-800-FDA-1088. Where should I keep my medication? Keep out of the reach of children and pets. Store at room temperature between 20 and 25 degrees C (68 and 77 degrees F). Get rid of any unused medication after the expiration date. To get rid of medications that are no longer needed or have expired: Take the medication to a medication take-back program. Check with your pharmacy or  law enforcement to find a location. If you cannot return the medication, check the label or package insert to see if the medication should be thrown out in the garbage or flushed down the toilet. If you are not sure, ask your care team. If it is safe to put it in the trash, take the medication out of the container. Mix the medication with cat litter, dirt, coffee grounds, or other unwanted substance. Seal the mixture in a bag or container. Put it in the trash. NOTE: This sheet is a summary. It may not cover all possible information. If you have questions about this medicine, talk to your doctor, pharmacist, or health care provider.  2022 [employer] Standard (2021-08-26 00:00:00)

## 2022-10-16 NOTE — Telephone Encounter (Signed)
Needs a virtual visit to see if she is a candidate for anti virals  Cannot call in anything without a visit

## 2022-10-16 NOTE — Telephone Encounter (Signed)
Patient has been scheduled

## 2022-10-23 DIAGNOSIS — L578 Other skin changes due to chronic exposure to nonionizing radiation: Secondary | ICD-10-CM | POA: Diagnosis not present

## 2022-10-23 DIAGNOSIS — D225 Melanocytic nevi of trunk: Secondary | ICD-10-CM | POA: Diagnosis not present

## 2022-10-23 DIAGNOSIS — L538 Other specified erythematous conditions: Secondary | ICD-10-CM | POA: Diagnosis not present

## 2022-10-23 DIAGNOSIS — L821 Other seborrheic keratosis: Secondary | ICD-10-CM | POA: Diagnosis not present

## 2022-10-23 DIAGNOSIS — Z08 Encounter for follow-up examination after completed treatment for malignant neoplasm: Secondary | ICD-10-CM | POA: Diagnosis not present

## 2022-10-23 DIAGNOSIS — L820 Inflamed seborrheic keratosis: Secondary | ICD-10-CM | POA: Diagnosis not present

## 2022-10-23 DIAGNOSIS — L814 Other melanin hyperpigmentation: Secondary | ICD-10-CM | POA: Diagnosis not present

## 2022-10-23 DIAGNOSIS — Z85828 Personal history of other malignant neoplasm of skin: Secondary | ICD-10-CM | POA: Diagnosis not present

## 2022-10-23 DIAGNOSIS — L570 Actinic keratosis: Secondary | ICD-10-CM | POA: Diagnosis not present

## 2022-10-24 ENCOUNTER — Ambulatory Visit (INDEPENDENT_AMBULATORY_CARE_PROVIDER_SITE_OTHER): Payer: PPO

## 2022-10-24 VITALS — Ht 64.0 in | Wt 152.0 lb

## 2022-10-24 DIAGNOSIS — Z0000 Encounter for general adult medical examination without abnormal findings: Secondary | ICD-10-CM

## 2022-10-24 NOTE — Progress Notes (Signed)
Subjective:   Mary Gordon is a 76 y.o. female who presents for Medicare Annual (Subsequent) preventive examination.  Review of Systems    Virtual Visit via Telephone Note  I connected with  Mary Gordon on 10/24/22 at  2:00 PM EST by telephone and verified that I am speaking with the correct person using two identifiers.  Location: Patient: Home Provider: Office Persons participating in the virtual visit: patient/Nurse Health Advisor   I discussed the limitations, risks, security and privacy concerns of performing an evaluation and management service by telephone and the availability of in person appointments. The patient expressed understanding and agreed to proceed.  Interactive audio and video telecommunications were attempted between this nurse and patient, however failed, due to patient having technical difficulties OR patient did not have access to video capability.  We continued and completed visit with audio only.  Some vital signs may be absent or patient reported.   Criselda Peaches, LPN  Cardiac Risk Factors include: advanced age (>38mn, >>98women);hypertension     Objective:    Today's Vitals   10/24/22 1406  Weight: 152 lb (68.9 kg)  Height: '5\' 4"'$  (1.626 m)   Body mass index is 26.09 kg/m.     10/24/2022    2:12 PM 06/25/2021    2:43 PM 08/20/2018   10:50 AM 08/19/2017   10:24 AM 07/14/2017   10:40 AM 05/09/2017    6:19 PM 08/18/2016    2:54 PM  Advanced Directives  Does Patient Have a Medical Advance Directive? Yes Yes Yes Yes Yes No Yes  Type of AParamedicof [place] will  [place]Living will [place] GardensLiving will Healthcare Power of [place] GroveLiving will  Does patient want to make changes to medical advance directive?       No - Patient declined  Copy of [place] Chart? No - copy requested  No - copy requested No - copy  requested   Yes    Current Medications (verified) Outpatient Encounter Medications as of 10/24/2022  Medication Sig   benzonatate (TESSALON) 200 MG capsule Take 1 capsule (200 mg total) by mouth 3 (three) times daily as needed.   chlorpheniramine (CHLOR-TRIMETON) 4 MG tablet Take 8 mg by mouth 2 (two) times daily.   famotidine (PEPCID) 20 MG tablet TAKE 1 TABLET BY MOUTH TWICE A DAY   NONFORMULARY OR COMPOUNDED ITEM compound testosterone cream   OVER THE COUNTER MEDICATION Take 5-7 mLs by mouth See admin instructions. Life Tones (uric acid cleanser- has celery extract and other herbs) liquid: Take 5-7 ml's by mouth once a day   pantoprazole (PROTONIX) 40 MG tablet TAKE 1 TABLET BY MOUTH EVERY DAY AS NEEDED FOR REFLUX   progesterone (PROMETRIUM) 100 MG capsule Take 100 mg by mouth at bedtime.   rosuvastatin (CRESTOR) 5 MG tablet TAKE 1 TABLET BY MOUTH TWICE A WEEK ON MONDAY/THURSDAY   sertraline (ZOLOFT) 50 MG tablet TAKE 1 TABLET BY MOUTH EVERY DAY   traZODone (DESYREL) 100 MG tablet TAKE 1 TABLET BY MOUTH EVERYDAY AT BEDTIME   No facility-administered encounter medications on file as of 10/24/2022.    Allergies (verified) Adhesive [tape], Amoxicillin-pot clavulanate, Macrobid [nitrofurantoin], and Simvastatin   History: Past Medical History:  Diagnosis Date   Allergy    all year   Anxiety    Bursitis of hip    Cataract    Cervical cancer ([place]ade 1978  Chronic kidney disease    RENAL INSUFF   Depression    Dysrhythmia    PVC'S   Fibromyalgia    GERD (gastroesophageal reflux disease)    High cholesterol    History of Clostridium difficile infection    In past from augmentin   History of hiatal hernia    Osteopenia    Palpitations    Panic disorder    Pulmonary HTN ([place]) 10/20/2014   moderate with PASP 14mHg   Pulmonary nodule    Being observed   Vitamin D deficiency    Past Surgical History:  Procedure Laterality Date   ABDOMINAL HYSTERECTOMY  1978   CARDIAC  CATHETERIZATION     CATARACT EXTRACTION W/PHACO Right 07/14/2017   Procedure: CATARACT EXTRACTION PHACO AND INTRAOCULAR LENS PLACEMENT ([place];  Surgeon: PBirder Robson MD;  Location: [facility] ORS;  Service: Ophthalmology;  Laterality: Right;  [place]:31 AP% 15.7 CDE 4.97 Fluid pack lot # [id number]   CATARACT EXTRACTION W/PHACO Left 08/04/2017   Procedure: CATARACT EXTRACTION PHACO AND INTRAOCULAR LENS PLACEMENT (I[facility]);  Surgeon: PBirder Robson MD;  Location: [facility] ORS;  Service: Ophthalmology;  Laterality: Left;  [place]00:34 AP% 18.7 CDE 6.42 Fluid pack lot # [id number]   COLONOSCOPY  2002   Normal   HAND SURGERY  2005   Left hand thrombosis   NASAL HEMORRHAGE CONTROL  2007   RIGHT HEART CATHETERIZATION N/A 03/16/2015   Procedure: RIGHT HEART CATH;  Surgeon: DLarey Dresser MD;  [facility];  Service: Cardiovascular;  Laterality: N/A;   SHOULDER SURGERY  2008   Right shoulder bone spur   THYROGLOSSAL DUCT CYST     Family History  Problem Relation Age of Onset   Heart attack Mother 659  Emphysema Mother    Allergies Mother    Asthma Mother    Allergies Father    Glaucoma Father    Arrhythmia Sister        Atrial fibrillation   Cancer Sister        breast   Breast cancer Sister    Emphysema Sister    Heart disease Sister    Rheumatologic disease Sister    Lung cancer Sister    Diabetes Other        Parent   Hyperlipidemia Other        Other relative   Hypertension Other        Parent, other relative   Breast cancer Other        Other relative, sister   Allergies Other        Siblings   Asthma Other        Sisters   Colon cancer Neg Hx    Esophageal cancer Neg Hx    Rectal cancer Neg Hx    Stomach cancer Neg Hx    Social History   Socioeconomic History   Marital status: Married    Spouse name: Not on file   Number of children: Not on file   Years of education: Not on file   Highest education level: Not on file  Occupational History   Occupation: Retired  from book keeping  Tobacco Use   Smoking status: Former    Packs/day: 1.00    Years: 17.00    Total pack years: 17.00    Types: Cigarettes    Quit date: 12/08/1978    Years since quitting: 43.9   Smokeless tobacco: Never  Vaping Use   Vaping Use: Never used  Substance and  Sexual Activity   Alcohol use: No    Alcohol/week: 0.0 standard drinks of alcohol   Drug use: No   Sexual activity: Yes    Partners: Male  Other Topics Concern   Not on file  Social History Narrative   Married (husband is colon cancer survivor)   Does not get regular exercise   G4P1   Social Determinants of Health   Financial Resource Strain: Low Risk  (10/24/2022)   Overall Financial Resource Strain (CARDIA)    Difficulty of Paying Living Expenses: Not hard at all  Food Insecurity: No Food Insecurity (10/24/2022)   Hunger Vital Sign    Worried About Running Out of Food in the Last Year: Never true    Ran Out of Food in the Last Year: Never true  Transportation Needs: No Transportation Needs (10/24/2022)   PRAPARE - Hydrologist (Medical): No    Lack of Transportation (Non-Medical): No  Physical Activity: Sufficiently Active (10/24/2022)   Exercise Vital Sign    Days of Exercise per Week: 3 days    Minutes of Exercise per Session: 110 min  Stress: No Stress Concern Present (10/24/2022)   [place]    Feeling of Stress : Not at all  Social Connections: [place] (10/24/2022)   Social Connection and Isolation Panel [NHANES]    Frequency of Communication with Friends and Family: More than three times a week    Frequency of Social Gatherings with Friends and Family: More than three times a week    Attends Religious Services: More than 4 times per year    Active Member of [employer] or Organizations: Yes    Attends Music therapist: More than 4 times per year    Marital Status: Married     Tobacco Counseling Counseling given: Not Answered   Clinical Intake:  Pre-visit preparation completed: No  Pain : No/denies pain     BMI - recorded: 26.09 Nutritional Status: BMI 25 -29 Overweight Nutritional Risks: None Diabetes: No  How often do you need to have someone help you when you read instructions, pamphlets, or other written materials from your doctor or pharmacy?: 1 - Never  Diabetic?  No  Interpreter Needed?: No  Information entered by :: Rolene Arbour LPN   Activities of Daily Living    10/24/2022    2:11 PM  In your present state of health, do you have any difficulty performing the following activities:  Hearing? 0  Vision? 0  Difficulty concentrating or making decisions? 0  Walking or climbing stairs? 0  Dressing or bathing? 0  Doing errands, shopping? 0  Preparing Food and eating ? N  Using the Toilet? N  In the past six months, have you accidently leaked urine? N  Do you have problems with loss of bowel control? N  Managing your Medications? N  Managing your Finances? N  Housekeeping or managing your Housekeeping? N    Patient Care Team: [name], Wynelle Fanny, MD as PCP - General Birder Robson, MD as Referring Physician (Ophthalmology) Larey Dresser, MD as Consulting Physician (Cardiology) Emily Filbert, MD as Consulting Physician (Obstetrics and Gynecology) Melida Quitter, MD as Consulting Physician (Otolaryngology)  Indicate any recent Medical Services you may have received from other than [facility] providers in the past year (date may be approximate).     Assessment:   This is a routine wellness examination for Mary Gordon.  Hearing/Vision screen Hearing Screening -  Comments:: Denies hearing difficulties   Vision Screening - Comments:: Wears rx glasses - up to date with routine eye exams with  [place] issues and exercise activities discussed: Exercise limited by: None identified   Goals Addressed                This Visit's Progress     No current goals (pt-stated)         Depression Screen    10/24/2022    2:10 PM 10/09/2021    8:26 AM 09/07/2020    2:23 PM 09/02/2019    3:28 PM 08/20/2018   10:49 AM 08/19/2017   10:19 AM 08/18/2016    2:55 PM  PHQ 2/9 Scores  PHQ - 2 Score 0 0 0 0 0 0 0  PHQ- 9 Score     0 0     Fall Risk    10/24/2022    2:11 PM 10/09/2021    8:26 AM 06/25/2021    2:43 PM 09/07/2020    2:22 PM 07/03/2020    1:37 PM  [place] in the past year? 0 1 1 0 0  Comment     Emmi Telephone Survey: data to providers prior to load  Number falls in past yr: 0 0 1    Injury with Fall? 0 0 0    Risk for fall due to : No Fall Risks      Follow up Falls prevention discussed Falls evaluation completed  Falls evaluation completed     [place]:  Any stairs in or around the home? Yes  If so, are there any without handrails? No  Home free of loose throw rugs in walkways, pet beds, electrical cords, etc? Yes  Adequate lighting in your home to reduce risk of falls? Yes   ASSISTIVE DEVICES UTILIZED TO PREVENT FALLS:  Life alert? No  Use of a cane, walker or w/c? No  Grab bars in the bathroom? Yes  Shower chair or bench in shower? No  Elevated toilet seat or a handicapped toilet? No   TIMED UP AND GO:  Was the test performed? No . Audio Visit   Cognitive Function:    08/20/2018   10:50 AM 08/19/2017   10:20 AM 08/18/2016    3:15 PM  MMSE - Mini Mental State Exam  Orientation to time '5 5 5  '$ Orientation to Place '5 5 5  '$ Registration '3 3 3  '$ Attention/ Calculation 0 0 0  Recall '3 3 3  '$ Language- name 2 objects 0 0 0  Language- repeat '1 1 1  '$ Language- follow 3 step command '3 3 3  '$ Language- read & follow direction 0 0 0  Write a sentence 0 0 0  Copy design 0 0 0  Total score '20 20 20        '$ 10/24/2022    2:12 PM  6CIT Screen  What Year? 0 points  What month? 0 points  What time? 0 points  Count back from 20 0 points   Months in reverse 0 points  Repeat phrase 0 points  Total Score 0 points    Immunizations Immunization History  Administered Date(s) Administered   Fluad Quad(high Dose 65+) 09/02/2019, 09/07/2020   Influenza Split 09/26/2011, 10/15/2012   Influenza Whole 09/04/2010   Influenza, High Dose Seasonal PF 08/30/2021   Influenza,inj,Quad PF,6+ Mos 09/06/2013, 09/08/2014, 09/17/2015, 08/18/2016, 08/19/2017, 11/18/2018   PFIZER Comirnaty(Gray Top)Covid-19 Tri-Sucrose Vaccine  03/22/2021   PFIZER(Purple Top)SARS-COV-2 Vaccination 01/30/2020, 02/20/2020, 09/16/2020   Pneumococcal Conjugate-13 08/25/2016   Pneumococcal Polysaccharide-23 10/07/2008, 08/19/2017   Td 08/08/2009   Tdap 09/20/2020   Zoster Recombinat (Shingrix) 01/20/2022   Zoster, Live 12/08/2008      Flu Vaccine status: Up to date  Pneumococcal vaccine status: Up to date  Covid-19 vaccine status: Completed vaccines  Qualifies for Shingles Vaccine? Yes   Zostavax completed Yes   Shingrix Completed?: Yes  Screening Tests Health Maintenance  Topic Date Due   COVID-19 Vaccine (5 - Pfizer series) 11/09/2022 (Originally 05/17/2021)   INFLUENZA VACCINE  03/08/2023 (Originally 07/08/2022)   MAMMOGRAM  08/07/2023   Medicare Annual Wellness (AWV)  10/25/2023   Pneumonia Vaccine 29+ Years old  Completed   DEXA SCAN  Completed   Hepatitis C Screening  Completed   HPV VACCINES  Aged Out   COLONOSCOPY (Pts 45-23yr Insurance coverage will need to be confirmed)  Discontinued   Zoster Vaccines- Shingrix  Discontinued    Health Maintenance  There are no preventive care reminders to display for this patient.   Colorectal cancer screening: No longer required.   Mammogram status: No longer required due to Age.  Bone Density status: Completed 04/23/21. Results reflect: Bone density results: OSTEOPOROSIS. Repeat every   years.  Lung Cancer Screening: (Low Dose CT Chest recommended if Age 76-80years, 30 pack-year currently  smoking OR have quit w/in 15years.) does not qualify.     Additional Screening:  Hepatitis C Screening: does qualify; Completed 08/19/17  Vision Screening: Recommended annual ophthalmology exams for early detection of glaucoma and other disorders of the eye. Is the patient up to date with their annual eye exam?  Yes  Who is the provider or what is the name of the office in which the patient attends annual eye exams? [place] is not established with a provider, would they like to be referred to a provider to establish care? No .   Dental Screening: Recommended annual dental exams for proper oral hygiene  Community Resource Referral / Chronic Care Management:  CRR required this visit?  No   CCM required this visit?  No      Plan:     I have personally reviewed and noted the following in the patient's chart:   Medical and social history Use of alcohol, tobacco or illicit drugs  Current medications and supplements including opioid prescriptions. Patient is not currently taking opioid prescriptions. Functional ability and status Nutritional status Physical activity Advanced directives List of other physicians Hospitalizations, surgeries, and ER visits in previous 12 months Vitals Screenings to include cognitive, depression, and falls Referrals and appointments  In addition, I have reviewed and discussed with patient certain preventive protocols, quality metrics, and best practice recommendations. A written personalized care plan for preventive services as well as general preventive health recommendations were provided to patient.     BCriselda Peaches LPN   [id number]  Nurse Notes: None

## 2022-10-24 NOTE — Patient Instructions (Addendum)
Mary Gordon , Thank you for taking time to come for your Medicare Wellness Visit. I appreciate your ongoing commitment to your health goals. Please review the following plan we discussed and let me know if I can assist you in the future.   These are the goals we discussed:  Goals       Increase physical activity      Starting 08/20/2018, I will continue to exercise for at least 120 min 2 days per week.       No current goals (pt-stated)        This is a list of the screening recommended for you and due dates:  Health Maintenance  Topic Date Due   COVID-19 Vaccine (5 - Pfizer series) 11/09/2022*   Flu Shot  03/08/2023*   Mammogram  08/07/2023   Medicare Annual Wellness Visit  10/25/2023   Pneumonia Vaccine  Completed   DEXA scan (bone density measurement)  Completed   Hepatitis C Screening: USPSTF Recommendation to screen - Ages 27-79 yo.  Completed   HPV Vaccine  Aged Out   Colon Cancer Screening  Discontinued   Zoster (Shingles) Vaccine  Discontinued  *Topic was postponed. The date shown is not the original due date.    Advanced directives: Please bring a copy of your health care power of attorney and living will to the office to be added to your chart at your convenience.   Conditions/risks identified: None  Next appointment: Follow up in one year for your annual wellness visit    Preventive Care 65 Years and Older, Female Preventive care refers to lifestyle choices and visits with your health care provider that can promote health and wellness. What does preventive care include? A yearly physical exam. This is also called an annual well check. Dental exams once or twice a year. Routine eye exams. Ask your health care provider how often you should have your eyes checked. Personal lifestyle choices, including: Daily care of your teeth and gums. Regular physical activity. Eating a healthy diet. Avoiding tobacco and drug use. Limiting alcohol use. Practicing safe  sex. Taking low-dose aspirin every day. Taking vitamin and mineral supplements as recommended by your health care provider. What happens during an annual well check? The services and screenings done by your health care provider during your annual well check will depend on your age, overall health, lifestyle risk factors, and family history of disease. Counseling  Your health care provider may ask you questions about your: Alcohol use. Tobacco use. Drug use. Emotional well-being. Home and relationship well-being. Sexual activity. Eating habits. History of falls. Memory and ability to understand (cognition). Work and work Statistician. Reproductive health. Screening  You may have the following tests or measurements: Height, weight, and BMI. Blood pressure. Lipid and cholesterol levels. These may be checked every 5 years, or more frequently if you are over 14 years old. Skin check. Lung cancer screening. You may have this screening every year starting at age 51 if you have a 30-pack-year history of smoking and currently smoke or have quit within the past 15 years. Fecal occult blood test (FOBT) of the stool. You may have this test every year starting at age 59. Flexible sigmoidoscopy or colonoscopy. You may have a sigmoidoscopy every 5 years or a colonoscopy every 10 years starting at age 59. Hepatitis C blood test. Hepatitis B blood test. Sexually transmitted disease (STD) testing. Diabetes screening. This is done by checking your blood sugar (glucose) after you have not eaten for  a while (fasting). You may have this done every 1-3 years. Bone density scan. This is done to screen for osteoporosis. You may have this done starting at age 96. Mammogram. This may be done every 1-2 years. Talk to your health care provider about how often you should have regular mammograms. Talk with your health care provider about your test results, treatment options, and if necessary, the need for more  tests. Vaccines  Your health care provider may recommend certain vaccines, such as: Influenza vaccine. This is recommended every year. Tetanus, diphtheria, and acellular pertussis (Tdap, Td) vaccine. You may need a Td booster every 10 years. Zoster vaccine. You may need this after age 109. Pneumococcal 13-valent conjugate (PCV13) vaccine. One dose is recommended after age 7. Pneumococcal polysaccharide (PPSV23) vaccine. One dose is recommended after age 72. Talk to your health care provider about which screenings and vaccines you need and how often you need them. This information is not intended to replace advice given to you by your health care provider. Make sure you discuss any questions you have with your health care provider. Document Released: 12/21/2015 Document Revised: 08/13/2016 Document Reviewed: 09/25/2015 Elsevier Interactive Patient Education  2017 [employer] Prevention in the Home Falls can cause injuries. They can happen to people of all ages. There are many things you can do to make your home safe and to help prevent falls. What can I do on the outside of my home? Regularly fix the edges of walkways and driveways and fix any cracks. Remove anything that might make you trip as you walk through a door, such as a raised step or threshold. Trim any bushes or trees on the path to your home. Use bright outdoor lighting. Clear any walking paths of anything that might make someone trip, such as rocks or tools. Regularly check to see if handrails are loose or broken. Make sure that both sides of any steps have handrails. Any raised decks and porches should have guardrails on the edges. Have any leaves, snow, or ice cleared regularly. Use sand or salt on walking paths during winter. Clean up any spills in your garage right away. This includes oil or grease spills. What can I do in the bathroom? Use night lights. Install grab bars by the toilet and in the tub and shower.  Do not use towel bars as grab bars. Use non-skid mats or decals in the tub or shower. If you need to sit down in the shower, use a plastic, non-slip stool. Keep the floor dry. Clean up any water that spills on the floor as soon as it happens. Remove soap buildup in the tub or shower regularly. Attach bath mats securely with double-sided non-slip rug tape. Do not have throw rugs and other things on the floor that can make you trip. What can I do in the bedroom? Use night lights. Make sure that you have a light by your bed that is easy to reach. Do not use any sheets or blankets that are too big for your bed. They should not hang down onto the floor. Have a firm chair that has side arms. You can use this for support while you get dressed. Do not have throw rugs and other things on the floor that can make you trip. What can I do in the kitchen? Clean up any spills right away. Avoid walking on wet floors. Keep items that you use a lot in easy-to-reach places. If you need to reach something above  you, use a strong step stool that has a grab bar. Keep electrical cords out of the way. Do not use floor polish or wax that makes floors slippery. If you must use wax, use non-skid floor wax. Do not have throw rugs and other things on the floor that can make you trip. What can I do with my stairs? Do not leave any items on the stairs. Make sure that there are handrails on both sides of the stairs and use them. Fix handrails that are broken or loose. Make sure that handrails are as long as the stairways. Check any carpeting to make sure that it is firmly attached to the stairs. Fix any carpet that is loose or worn. Avoid having throw rugs at the top or bottom of the stairs. If you do have throw rugs, attach them to the floor with carpet tape. Make sure that you have a light switch at the top of the stairs and the bottom of the stairs. If you do not have them, ask someone to add them for you. What else  can I do to help prevent falls? Wear shoes that: Do not have high heels. Have rubber bottoms. Are comfortable and fit you well. Are closed at the toe. Do not wear sandals. If you use a stepladder: Make sure that it is fully opened. Do not climb a closed stepladder. Make sure that both sides of the stepladder are locked into place. Ask someone to hold it for you, if possible. Clearly mark and make sure that you can see: Any grab bars or handrails. First and last steps. Where the edge of each step is. Use tools that help you move around (mobility aids) if they are needed. These include: Canes. Walkers. Scooters. Crutches. Turn on the lights when you go into a dark area. Replace any light bulbs as soon as they burn out. Set up your furniture so you have a clear path. Avoid moving your furniture around. If any of your floors are uneven, fix them. If there are any pets around you, be aware of where they are. Review your medicines with your doctor. Some medicines can make you feel dizzy. This can increase your chance of falling. Ask your doctor what other things that you can do to help prevent falls. This information is not intended to replace advice given to you by your health care provider. Make sure you discuss any questions you have with your health care provider. Document Released: 09/20/2009 Document Revised: 05/01/2016 Document Reviewed: 12/29/2014 Elsevier Interactive Patient Education  2017 [employer].

## 2022-10-28 ENCOUNTER — Telehealth: Payer: PPO | Admitting: Family Medicine

## 2022-10-28 DIAGNOSIS — U071 COVID-19: Secondary | ICD-10-CM

## 2022-10-28 NOTE — Progress Notes (Signed)
Thank you for the details you included in the comment boxes. Those details are very helpful in determining the best course of treatment for you and help [place] to provide the best care.Because COVID +, we recommend that you convert this visit to a video visit in order for the provider to better assess what is going on.  The provider will be able to give you a more accurate diagnosis and treatment plan if we can more freely discuss your symptoms and with the addition of a virtual examination.    If you convert to a video visit, we will bill your insurance (similar to an office visit) and you will not be charged for this e-Visit. You will be able to stay at home and speak with the first available [facility] advanced practice provider. The link to do a video visit is in the drop down Menu tab of your Welcome screen in [place].

## 2022-11-07 ENCOUNTER — Ambulatory Visit (INDEPENDENT_AMBULATORY_CARE_PROVIDER_SITE_OTHER): Payer: PPO | Admitting: Family Medicine

## 2022-11-07 ENCOUNTER — Encounter: Payer: Self-pay | Admitting: Family Medicine

## 2022-11-07 VITALS — BP 116/70 | HR 82 | Temp 97.3°F | Ht 64.0 in | Wt 157.2 lb

## 2022-11-07 DIAGNOSIS — J0100 Acute maxillary sinusitis, unspecified: Secondary | ICD-10-CM

## 2022-11-07 MED ORDER — DOXYCYCLINE HYCLATE 100 MG PO TABS: 100.0000 mg | ORAL_TABLET | Freq: Two times a day (BID) | ORAL | 0 refills | Status: DC

## 2022-11-07 NOTE — Assessment & Plan Note (Signed)
S/p case of covid  Sinus pain and purulent nasal discharge   Px doxycycline   (augmentin caused c diff in past) Will eat yogurt for probiotic Fluids/rest Nasal saline prn Update if not starting to improve in a week or if worsening

## 2022-11-07 NOTE — Progress Notes (Signed)
Subjective:    Patient ID: Mary Gordon, female    DOB: 07-01-1946, 76 y.o.   MRN: [id number]  HPI Pt presents for sinus symptoms   Wt Readings from Last 3 Encounters:  11/07/22 157 lb 4 oz (71.3 kg)  10/24/22 152 lb (68.9 kg)  10/16/22 152 lb (68.9 kg)   26.99 kg/m    Had covid Paxlovid gave her throat swelling   Sore throat-still  Nasal d/c is green/ yellow and with blood  Face hurts  Mostly in her cheeks Ears were blocked/ better now   Had fever to start - 101  That is better now   Breathing is ok  Congested at night  No wheezing No sob   Patient Active Problem List   Diagnosis Date Noted   Hair loss 08/05/2022   Brittle nails 08/05/2022   Low vitamin B12 level 08/05/2022   Change in voice 06/08/2022   Acute cystitis without hematuria 02/03/2022   Burning sensation 02/03/2022   Estrogen deficiency 09/07/2020   Sprain of ankle 06/09/2018   Laryngopharyngeal reflux (LPR) 01/08/2017   Pharyngeal dysphagia 01/08/2017   Prediabetes 08/25/2016   Routine general medical examination at a health care facility 08/19/2016   Stress reaction 09/17/2015   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   Rosacea 12/27/2014   Pulmonary HTN ([place]) 10/20/2014   DOE (dyspnea on exertion) 09/26/2014   PVCs (premature ventricular contractions) 09/26/2014   Encounter for Medicare annual wellness exam 03/18/2014   Fatigue 05/31/2012   Fibromyalgia 05/31/2012   Special screening for malignant neoplasms, colon 09/26/2011   GERD (gastroesophageal reflux disease) 07/08/2011   CERVICAL RADICULOPATHY, RIGHT 09/04/2010   PULMONARY NODULE 09/14/2009   PALPITATIONS 08/17/2009   Vitamin D deficiency 02/22/2009   HYPERCHOLESTEROLEMIA 02/22/2009   Adjustment disorder with mixed anxiety and depressed mood 02/22/2009   ALLERGIC RHINITIS 02/22/2009   MENOPAUSAL SYNDROME 02/22/2009   Osteopenia 02/22/2009   Past Medical History:  Diagnosis Date   Allergy    all year   Anxiety     Bursitis of hip    Cataract    Cervical cancer ([facility]) 1978   Chronic kidney disease    RENAL INSUFF   Depression    Dysrhythmia    PVC'S   Fibromyalgia    GERD (gastroesophageal reflux disease)    High cholesterol    History of Clostridium difficile infection    In past from augmentin   History of hiatal hernia    Osteopenia    Palpitations    Panic disorder    Pulmonary HTN ([place]) 10/20/2014   moderate with PASP 85mHg   Pulmonary nodule    Being observed   Vitamin D deficiency    Past Surgical History:  Procedure Laterality Date   ABDOMINAL HYSTERECTOMY  1978   CARDIAC CATHETERIZATION     CATARACT EXTRACTION W/PHACO Right 07/14/2017   Procedure: CATARACT EXTRACTION PHACO AND INTRAOCULAR LENS PLACEMENT ([place];  Surgeon: PBirder Robson MD;  Location: [facility] ORS;  Service: Ophthalmology;  Laterality: Right;  [place] AP% 15.7 CDE 4.97 Fluid pack lot # [id number]   CATARACT EXTRACTION W/PHACO Left 08/04/2017   Procedure: CATARACT EXTRACTION PHACO AND INTRAOCULAR LENS PLACEMENT ([facility]);  Surgeon: PBirder Robson MD;  Location: [facility] ORS;  Service: Ophthalmology;  Laterality: Left;  [place]:34 AP% 18.7 CDE 6.42 Fluid pack lot # [id number]   COLONOSCOPY  2002   Normal   HAND SURGERY  2005   Left hand thrombosis   NASAL HEMORRHAGE CONTROL  2007  RIGHT HEART CATHETERIZATION N/A 03/16/2015   Procedure: RIGHT HEART CATH;  Surgeon: Larey Dresser, MD;  Location: [facility] CATH LAB;  Service: Cardiovascular;  Laterality: N/A;   SHOULDER SURGERY  2008   Right shoulder bone spur   THYROGLOSSAL DUCT CYST     Social History   Tobacco Use   Smoking status: Former    Packs/day: 1.00    Years: 17.00    Total pack years: 17.00    Types: Cigarettes    Quit date: 12/08/1978    Years since quitting: 43.9   Smokeless tobacco: Never  Vaping Use   Vaping Use: Never used  Substance Use Topics   Alcohol use: No    Alcohol/week: 0.0 standard drinks of alcohol   Drug use: No   Family History   Problem Relation Age of Onset   Heart attack Mother 59   Emphysema Mother    Allergies Mother    Asthma Mother    Allergies Father    Glaucoma Father    Arrhythmia Sister        Atrial fibrillation   Cancer Sister        breast   Breast cancer Sister    Emphysema Sister    Heart disease Sister    Rheumatologic disease Sister    Lung cancer Sister    Diabetes Other        Parent   Hyperlipidemia Other        Other relative   Hypertension Other        Parent, other relative   Breast cancer Other        Other relative, sister   Allergies Other        Siblings   Asthma Other        Sisters   Colon cancer Neg Hx    Esophageal cancer Neg Hx    Rectal cancer Neg Hx    Stomach cancer Neg Hx    Allergies  Allergen Reactions   Adhesive [Tape] Rash and Other (See Comments)    "Burns" the skin   Amoxicillin-Pot Clavulanate Other (See Comments)    Resulted in C-DIFF!!!!   Paxlovid [Nirmatrelvir-Ritonavir] Anaphylaxis    Throat swelling    Macrobid [Nitrofurantoin] Diarrhea    Severe abdominal cramping   Simvastatin Other (See Comments)    Caused severe muscle pain   Current Outpatient Medications on File Prior to Visit  Medication Sig Dispense Refill   benzonatate (TESSALON) 200 MG capsule Take 1 capsule (200 mg total) by mouth 3 (three) times daily as needed. 30 capsule 1   chlorpheniramine (CHLOR-TRIMETON) 4 MG tablet Take 8 mg by mouth 2 (two) times daily.     famotidine (PEPCID) 20 MG tablet TAKE 1 TABLET BY MOUTH TWICE A DAY 180 tablet 2   NONFORMULARY OR COMPOUNDED ITEM compound testosterone cream     OVER THE COUNTER MEDICATION Take 5-7 mLs by mouth See admin instructions. Life Tones (uric acid cleanser- has celery extract and other herbs) liquid: Take 5-7 ml's by mouth once a day     pantoprazole (PROTONIX) 40 MG tablet TAKE 1 TABLET BY MOUTH EVERY DAY AS NEEDED FOR REFLUX 90 tablet 0   progesterone (PROMETRIUM) 100 MG capsule Take 100 mg by mouth at bedtime.      rosuvastatin (CRESTOR) 5 MG tablet TAKE 1 TABLET BY MOUTH TWICE A WEEK ON MONDAY/THURSDAY 24 tablet 2   sertraline (ZOLOFT) 50 MG tablet TAKE 1 TABLET BY MOUTH EVERY DAY 90 tablet 3  traZODone (DESYREL) 100 MG tablet TAKE 1 TABLET BY MOUTH EVERYDAY AT BEDTIME 90 tablet 3   No current facility-administered medications on file prior to visit.      Review of Systems  Constitutional:  Positive for appetite change. Negative for fatigue and fever.  HENT:  Positive for congestion, ear pain, postnasal drip, rhinorrhea, sinus pressure and sore throat. Negative for nosebleeds.   Eyes:  Negative for pain, redness and itching.  Respiratory:  Positive for cough. Negative for shortness of breath and wheezing.   Cardiovascular:  Negative for chest pain.  Gastrointestinal:  Negative for abdominal pain, diarrhea, nausea and vomiting.  Endocrine: Negative for polyuria.  Genitourinary:  Negative for dysuria, frequency and urgency.  Musculoskeletal:  Negative for arthralgias and myalgias.  Allergic/Immunologic: Negative for immunocompromised state.  Neurological:  Positive for headaches. Negative for dizziness, tremors, syncope, weakness and numbness.  Hematological:  Negative for adenopathy. Does not bruise/bleed easily.  Psychiatric/Behavioral:  Negative for dysphoric mood. The patient is not nervous/anxious.        Objective:   Physical Exam Constitutional:      General: She is not in acute distress.    Appearance: Normal appearance. She is well-developed and normal weight. She is not ill-appearing or diaphoretic.  HENT:     Head: Normocephalic and atraumatic.     Comments: Bilateral maxillary sinus tenderness     Right Ear: Tympanic membrane and external ear normal.     Left Ear: Tympanic membrane and external ear normal.     Nose: Congestion and rhinorrhea present.     Mouth/Throat:     Pharynx: Oropharynx is clear. No oropharyngeal exudate or posterior oropharyngeal erythema.     Comments:  Clear pnd Eyes:     General:        Right eye: No discharge.        Left eye: No discharge.     Conjunctiva/sclera: Conjunctivae normal.     Pupils: Pupils are equal, round, and reactive to light.  Cardiovascular:     Rate and Rhythm: Normal rate and regular rhythm.  Pulmonary:     Effort: Pulmonary effort is normal. No respiratory distress.     Breath sounds: Normal breath sounds. No wheezing or rales.     Comments: Good air exch No rales or rhonchi Mildly distant bs  Musculoskeletal:     Cervical back: Normal range of motion and neck supple.  Lymphadenopathy:     Cervical: No cervical adenopathy.  Skin:    General: Skin is warm and dry.     Findings: No rash.  Neurological:     Mental Status: She is alert.     Cranial Nerves: No cranial nerve deficit.     Coordination: Coordination normal.  Psychiatric:        Mood and Affect: Mood normal.           Assessment & Plan:   Problem List Items Addressed This Visit       Respiratory   Acute sinusitis - Primary    S/p case of covid  Sinus pain and purulent nasal discharge   Px doxycycline   (augmentin caused c diff in past) Will eat yogurt for probiotic Fluids/rest Nasal saline prn Update if not starting to improve in a week or if worsening        Relevant Medications   doxycycline (VIBRA-TABS) 100 MG tablet

## 2022-11-07 NOTE — Patient Instructions (Signed)
Drink lots of fluids   Nasal saline irrigation helps   Take the doxycycline as directed for sinus infection   Get some extra rest   Update if not starting to improve in a week or if worsening

## 2022-11-08 IMAGING — MG MM DIGITAL SCREENING BILAT W/ TOMO AND CAD
6 of 12 series · 6 of 36 positions shown · non-contrast
Comparison: Previous exam(s).

CLINICAL DATA: Screening.

EXAM:
DIGITAL SCREENING BILATERAL MAMMOGRAM WITH TOMOSYNTHESIS AND CAD
TECHNIQUE: Bilateral screening digital craniocaudal and mediolateral oblique
mammograms were obtained. Bilateral screening digital breast
tomosynthesis was performed. The images were evaluated with
computer-aided detection.

[L CC synth-2D]
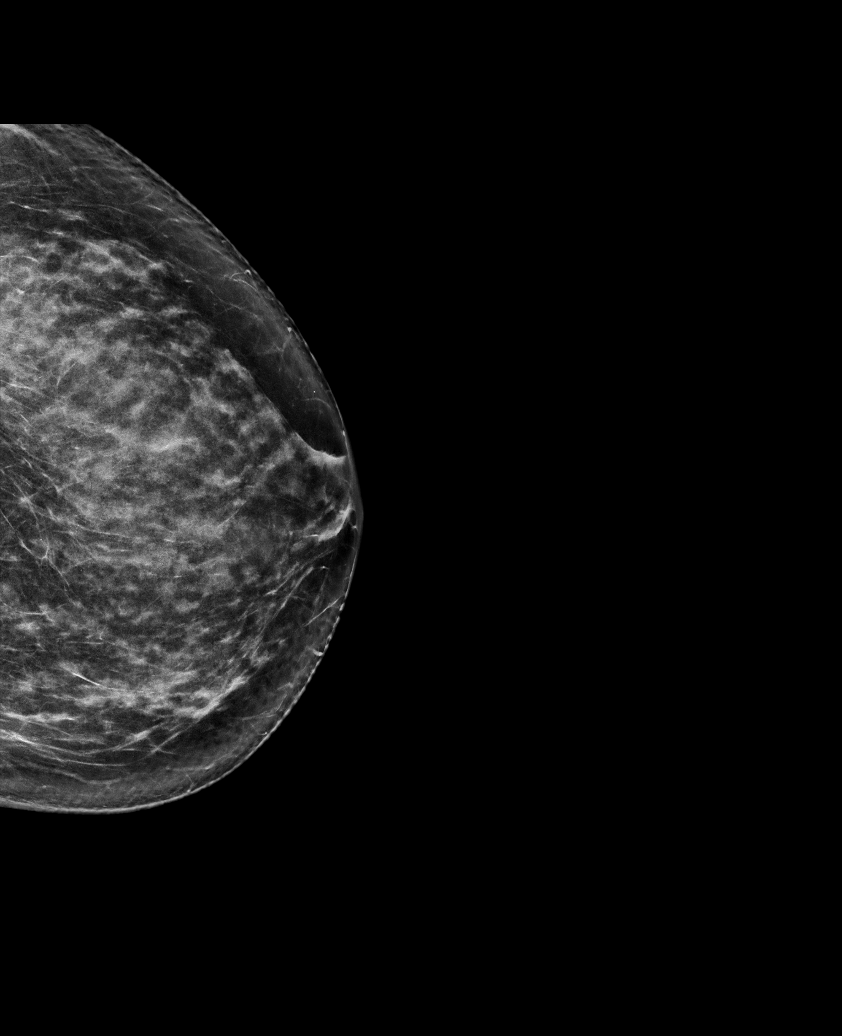

[L MLO synth-2D]
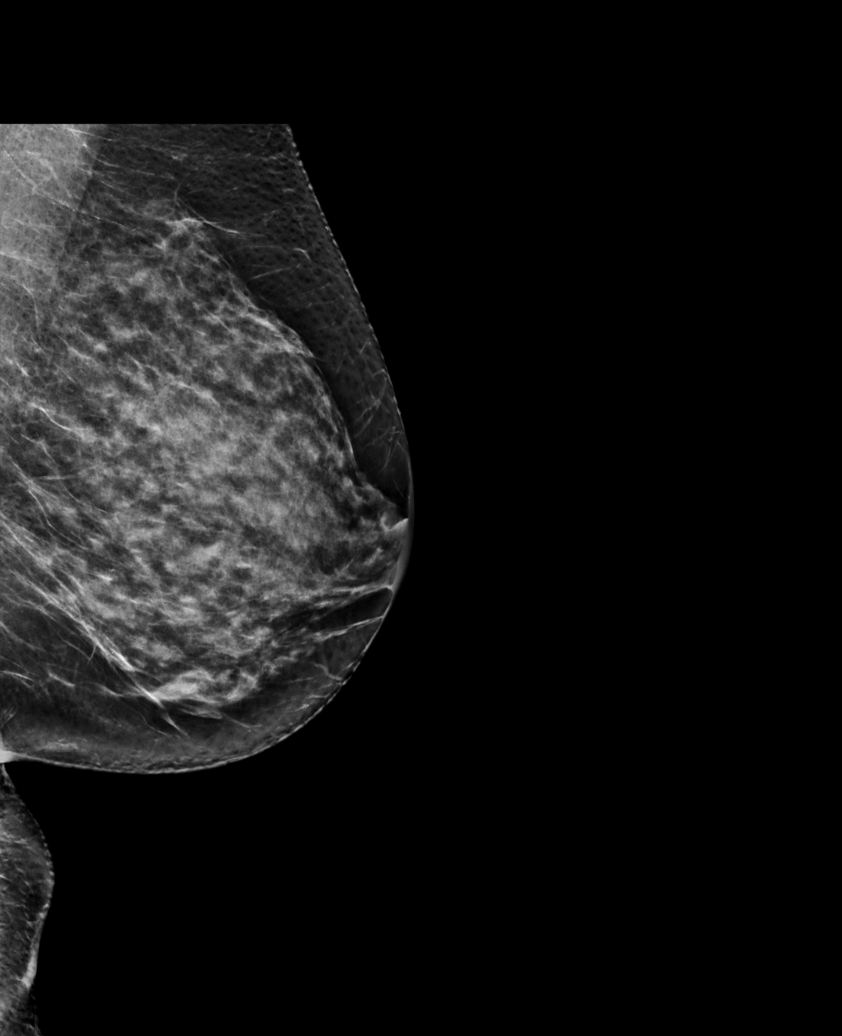

[R CC synth-2D (1 of 2)]
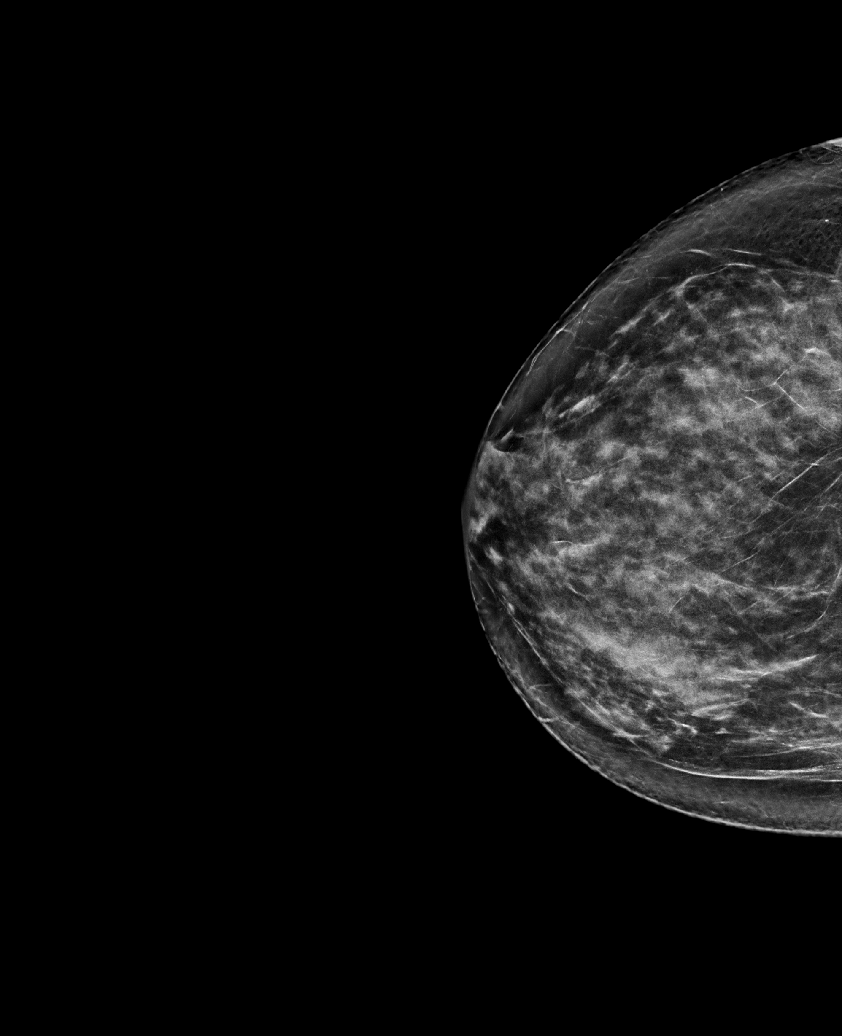

[R MLO synth-2D (1 of 2)]
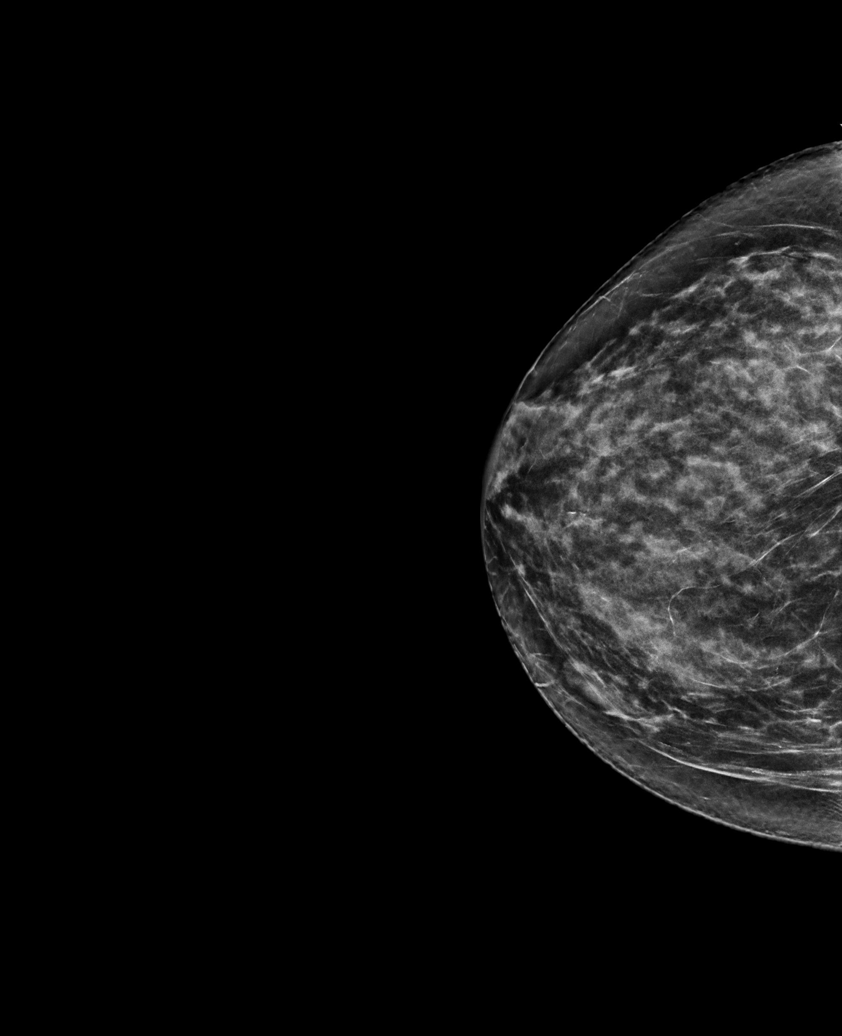

[R CC synth-2D (2 of 2)]
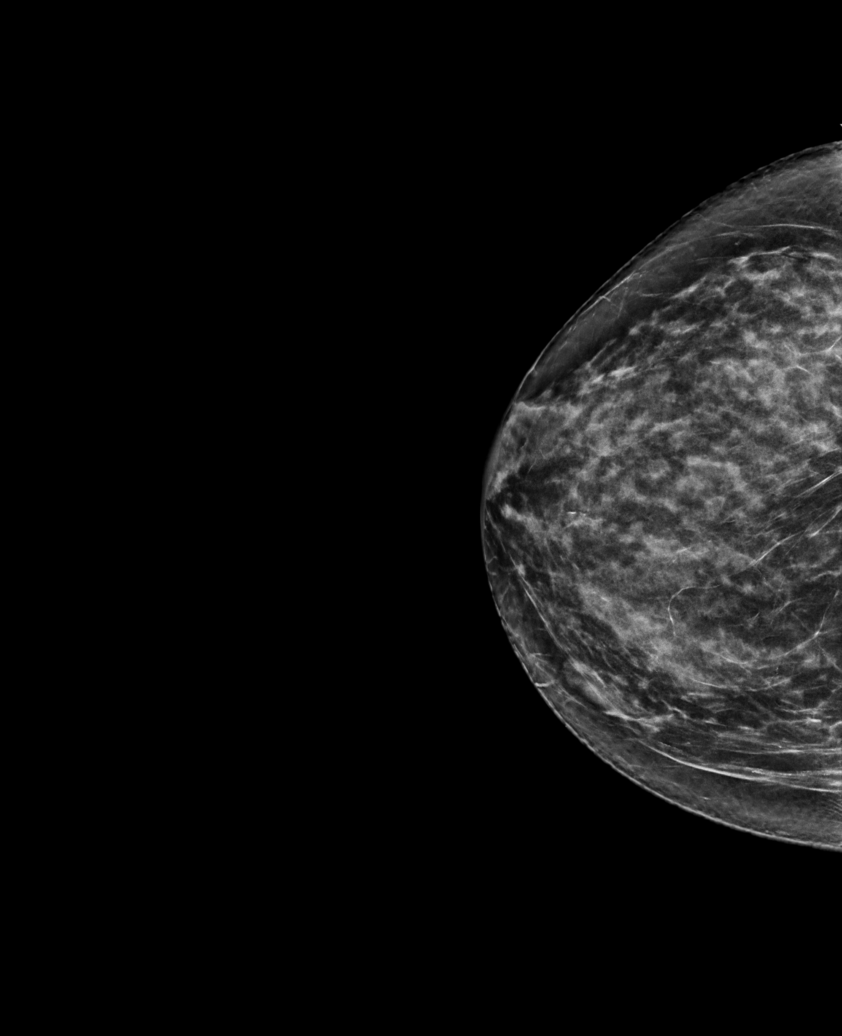

[R MLO synth-2D (2 of 2)]
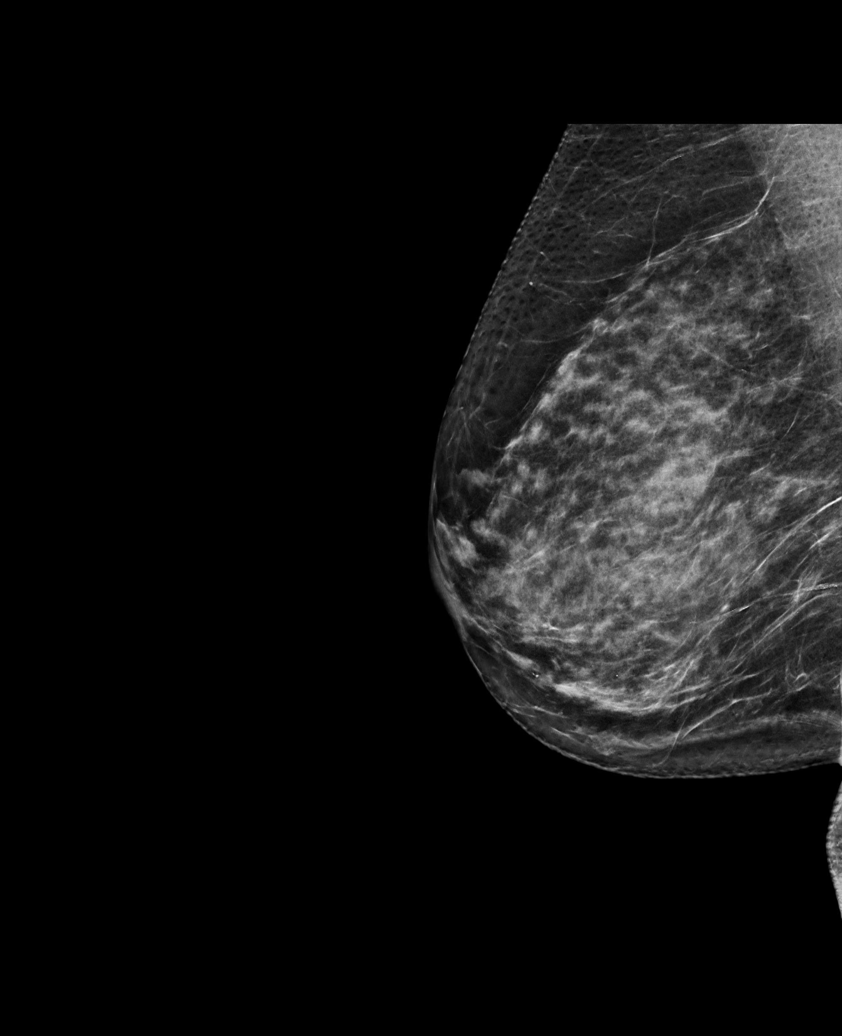

[6 of 36 positions shown; findings below may reference images not displayed]

ACR Breast Density Category c: The breast tissue is heterogeneously
dense, which may obscure small masses.
FINDINGS: There are no findings suspicious for malignancy.
IMPRESSION: No mammographic evidence of malignancy. A result letter of this
screening mammogram will be mailed directly to the patient.

RECOMMENDATION:
Screening mammogram in one year. (Code:Q3-W-BC3)

BI-RADS CATEGORY  1: Negative.

## 2022-12-06 ENCOUNTER — Other Ambulatory Visit: Payer: Self-pay | Admitting: Family Medicine

## 2022-12-09 NOTE — Telephone Encounter (Signed)
Last CPE pt had was 10/09/21, please schedule pt's part 2 CPE and then route back to me to refill

## 2022-12-09 NOTE — Telephone Encounter (Signed)
Patient has been scheduled

## 2022-12-10 ENCOUNTER — Telehealth: Payer: Self-pay | Admitting: Family Medicine

## 2022-12-10 DIAGNOSIS — I272 Pulmonary hypertension, unspecified: Secondary | ICD-10-CM

## 2022-12-10 DIAGNOSIS — E559 Vitamin D deficiency, unspecified: Secondary | ICD-10-CM

## 2022-12-10 DIAGNOSIS — R7303 Prediabetes: Secondary | ICD-10-CM

## 2022-12-10 DIAGNOSIS — R7989 Other specified abnormal findings of blood chemistry: Secondary | ICD-10-CM

## 2022-12-10 DIAGNOSIS — R5383 Other fatigue: Secondary | ICD-10-CM

## 2022-12-10 DIAGNOSIS — E7800 Pure hypercholesterolemia, unspecified: Secondary | ICD-10-CM

## 2022-12-10 NOTE — Telephone Encounter (Signed)
-----   Message from Velna Hatchet, RT sent at 12/09/2022  4:48 PM EST ----- Regarding: Thu 1/4 lab Patient is scheduled for cpx, please order future labs.  Thanks, Anda Kraft

## 2022-12-11 ENCOUNTER — Other Ambulatory Visit (INDEPENDENT_AMBULATORY_CARE_PROVIDER_SITE_OTHER): Payer: PPO

## 2022-12-11 DIAGNOSIS — R5383 Other fatigue: Secondary | ICD-10-CM | POA: Diagnosis not present

## 2022-12-11 DIAGNOSIS — E559 Vitamin D deficiency, unspecified: Secondary | ICD-10-CM | POA: Diagnosis not present

## 2022-12-11 DIAGNOSIS — R7989 Other specified abnormal findings of blood chemistry: Secondary | ICD-10-CM

## 2022-12-11 DIAGNOSIS — E7800 Pure hypercholesterolemia, unspecified: Secondary | ICD-10-CM | POA: Diagnosis not present

## 2022-12-11 DIAGNOSIS — R7303 Prediabetes: Secondary | ICD-10-CM | POA: Diagnosis not present

## 2022-12-11 LAB — LIPID PANEL
Cholesterol: 168 mg/dL (ref 0–200)
HDL: 58.8 mg/dL (ref >39.00)
LDL Cholesterol: 77 mg/dL (ref 0–99)
NonHDL: 108.88
Total CHOL/HDL Ratio: 3
Triglycerides: 158 mg/dL — ABNORMAL HIGH (ref 0.0–149.0)
VLDL: 31.6 mg/dL (ref 0.0–40.0)

## 2022-12-11 LAB — COMPREHENSIVE METABOLIC PANEL
ALT: 17 U/L (ref 0–35)
AST: 20 U/L (ref 0–37)
Albumin: 4.2 g/dL (ref 3.5–5.2)
Alkaline Phosphatase: 108 U/L (ref 39–117)
BUN: 9 mg/dL (ref 6–23)
CO2: 28 mEq/L (ref 19–32)
Calcium: 9.5 mg/dL (ref 8.4–10.5)
Chloride: 104 mEq/L (ref 96–112)
Creatinine, Ser: 0.8 mg/dL (ref 0.40–1.20)
GFR: 71.67 mL/min (ref >60.00)
Glucose, Bld: 97 mg/dL (ref 70–99)
Potassium: 4.4 mEq/L (ref 3.5–5.1)
Sodium: 139 mEq/L (ref 135–145)
Total Bilirubin: 0.7 mg/dL (ref 0.2–1.2)
Total Protein: 6.4 g/dL (ref 6.0–8.3)

## 2022-12-11 LAB — VITAMIN B12: Vitamin B-12: 255 pg/mL (ref 211–911)

## 2022-12-11 LAB — TSH: TSH: 1.88 u[IU]/mL (ref 0.35–5.50)

## 2022-12-11 LAB — POCT GLYCOSYLATED HEMOGLOBIN (HGB A1C): Hemoglobin A1C: 5.5 % (ref 4.0–5.6)

## 2022-12-11 LAB — VITAMIN D 25 HYDROXY (VIT D DEFICIENCY, FRACTURES): VITD: 64.66 ng/mL (ref 30.00–100.00)

## 2022-12-11 NOTE — Addendum Note (Signed)
Addended by: Tammi Sou on: 12/11/2022 03:02 PM   Modules accepted: Orders

## 2022-12-12 ENCOUNTER — Telehealth: Payer: Self-pay

## 2022-12-12 ENCOUNTER — Other Ambulatory Visit: Payer: PPO

## 2022-12-12 DIAGNOSIS — R7303 Prediabetes: Secondary | ICD-10-CM | POA: Diagnosis not present

## 2022-12-12 DIAGNOSIS — R5383 Other fatigue: Secondary | ICD-10-CM | POA: Diagnosis not present

## 2022-12-12 DIAGNOSIS — E7800 Pure hypercholesterolemia, unspecified: Secondary | ICD-10-CM | POA: Diagnosis not present

## 2022-12-12 NOTE — Telephone Encounter (Signed)
Error - duplicate message

## 2022-12-12 NOTE — Telephone Encounter (Signed)
Patient has been rescheduled.

## 2022-12-12 NOTE — Addendum Note (Signed)
Addended by: Tammi Sou on: 12/12/2022 03:27 PM   Modules accepted: Orders

## 2022-12-12 NOTE — Telephone Encounter (Signed)
I left a vmail for patient to call back to schedule a labs only visit.  She needs a CBC drawn again.  The last sample did not have enough to complete the test.

## 2022-12-13 LAB — CBC WITH DIFFERENTIAL/PLATELET
Absolute Monocytes: 348 cells/uL (ref 200–950)
Basophils Absolute: 42 cells/uL (ref 0–200)
Basophils Relative: 0.7 %
Eosinophils Absolute: 240 cells/uL (ref 15–500)
Eosinophils Relative: 4 %
HCT: 37.9 % (ref 35.0–45.0)
Hemoglobin: 12.4 g/dL (ref 11.7–15.5)
Lymphs Abs: 1992 cells/uL (ref 850–3900)
MCH: 27.6 pg (ref 27.0–33.0)
MCHC: 32.7 g/dL (ref 32.0–36.0)
MCV: 84.4 fL (ref 80.0–100.0)
MPV: 10.7 fL (ref 7.5–12.5)
Monocytes Relative: 5.8 %
Neutro Abs: 3378 cells/uL (ref 1500–7800)
Neutrophils Relative %: 56.3 %
Platelets: 278 10*3/uL (ref 140–400)
RBC: 4.49 10*6/uL (ref 3.80–5.10)
RDW: 12.5 % (ref 11.0–15.0)
Total Lymphocyte: 33.2 %
WBC: 6 10*3/uL (ref 3.8–10.8)

## 2022-12-16 DIAGNOSIS — N951 Menopausal and female climacteric states: Secondary | ICD-10-CM | POA: Diagnosis not present

## 2022-12-16 DIAGNOSIS — E039 Hypothyroidism, unspecified: Secondary | ICD-10-CM | POA: Diagnosis not present

## 2022-12-18 ENCOUNTER — Encounter: Payer: PPO | Admitting: Family Medicine

## 2022-12-18 DIAGNOSIS — R6882 Decreased libido: Secondary | ICD-10-CM | POA: Diagnosis not present

## 2022-12-18 DIAGNOSIS — Z6827 Body mass index (BMI) 27.0-27.9, adult: Secondary | ICD-10-CM | POA: Diagnosis not present

## 2022-12-18 DIAGNOSIS — N951 Menopausal and female climacteric states: Secondary | ICD-10-CM | POA: Diagnosis not present

## 2022-12-18 DIAGNOSIS — E039 Hypothyroidism, unspecified: Secondary | ICD-10-CM | POA: Diagnosis not present

## 2022-12-18 DIAGNOSIS — N898 Other specified noninflammatory disorders of vagina: Secondary | ICD-10-CM | POA: Diagnosis not present

## 2022-12-19 ENCOUNTER — Ambulatory Visit (INDEPENDENT_AMBULATORY_CARE_PROVIDER_SITE_OTHER): Payer: PPO | Admitting: Family Medicine

## 2022-12-19 ENCOUNTER — Encounter: Payer: Self-pay | Admitting: Family Medicine

## 2022-12-19 VITALS — BP 122/68 | HR 78 | Temp 97.7°F | Ht 63.75 in | Wt 159.2 lb

## 2022-12-19 DIAGNOSIS — E7800 Pure hypercholesterolemia, unspecified: Secondary | ICD-10-CM | POA: Diagnosis not present

## 2022-12-19 DIAGNOSIS — R7989 Other specified abnormal findings of blood chemistry: Secondary | ICD-10-CM

## 2022-12-19 DIAGNOSIS — I272 Pulmonary hypertension, unspecified: Secondary | ICD-10-CM

## 2022-12-19 DIAGNOSIS — E2839 Other primary ovarian failure: Secondary | ICD-10-CM

## 2022-12-19 DIAGNOSIS — K219 Gastro-esophageal reflux disease without esophagitis: Secondary | ICD-10-CM | POA: Diagnosis not present

## 2022-12-19 DIAGNOSIS — E559 Vitamin D deficiency, unspecified: Secondary | ICD-10-CM | POA: Diagnosis not present

## 2022-12-19 DIAGNOSIS — R7303 Prediabetes: Secondary | ICD-10-CM

## 2022-12-19 DIAGNOSIS — M85851 Other specified disorders of bone density and structure, right thigh: Secondary | ICD-10-CM

## 2022-12-19 DIAGNOSIS — F4323 Adjustment disorder with mixed anxiety and depressed mood: Secondary | ICD-10-CM

## 2022-12-19 DIAGNOSIS — Z0000 Encounter for general adult medical examination without abnormal findings: Secondary | ICD-10-CM

## 2022-12-19 MED ORDER — TRAZODONE HCL 100 MG PO TABS: ORAL_TABLET | ORAL | 3 refills | Status: DC

## 2022-12-19 MED ORDER — ROSUVASTATIN CALCIUM 5 MG PO TABS: ORAL_TABLET | ORAL | 3 refills | Status: DC

## 2022-12-19 MED ORDER — SERTRALINE HCL 50 MG PO TABS: 50.0000 mg | ORAL_TABLET | Freq: Every day | ORAL | 3 refills | Status: DC

## 2022-12-19 MED ORDER — FAMOTIDINE 20 MG PO TABS: 20.0000 mg | ORAL_TABLET | Freq: Two times a day (BID) | ORAL | 3 refills | Status: DC

## 2022-12-19 NOTE — Patient Instructions (Addendum)
Bone density test is due in June  Let us know when you want me to put an order in   Add some strength training if you can    Add vitamin more vitamin B12  1000 mcg daily   Keep up the good work with diet and exercise

## 2022-12-19 NOTE — Progress Notes (Unsigned)
Subjective:    Patient ID: Mary Gordon, female    DOB: 1946/04/01, 77 y.o.   MRN: [id number]  HPI Here for health maintenance exam and to review chronic medical problems   Wt Readings from Last 3 Encounters:  12/19/22 159 lb 4 oz (72.2 kg)  11/07/22 157 lb 4 oz (71.3 kg)  10/24/22 152 lb (68.9 kg)   27.55 kg/m  Has been feeling good   Dealing with a trigger finger -needs appt with Dr Lorelei Pont R ring finger   Immunization History  Administered Date(s) Administered   Fluad Quad(high Dose 65+) 09/02/2019, 09/07/2020   Influenza Split 09/26/2011, 10/15/2012   Influenza Whole 09/04/2010   Influenza, High Dose Seasonal PF 08/30/2021   Influenza,inj,Quad PF,6+ Mos 09/06/2013, 09/08/2014, 09/17/2015, 08/18/2016, 08/19/2017, 11/18/2018   PFIZER Comirnaty(Gray Top)Covid-19 Tri-Sucrose Vaccine 03/22/2021   PFIZER(Purple Top)SARS-COV-2 Vaccination 01/30/2020, 02/20/2020, 09/16/2020   Pneumococcal Conjugate-13 08/25/2016   Pneumococcal Polysaccharide-23 10/07/2008, 08/19/2017   Td 08/08/2009   Tdap 09/20/2020   Zoster Recombinat (Shingrix) 01/20/2022   Zoster, Live 12/08/2008   There are no preventive care reminders to display for this patient.  Mammogram 07/2022 Self breast exam: no lumps   Blue sky for hrt Takes progesterone Some testosterone cream   Taking liothyronine for low norrmal thyroid function  Decreased dose last time  Thy profile is normal   Feels a lot better overall  Not having hot flashes    Dexa  04/2021  osteopenia  Falls: none  Fractures:none Supplements : vitamin D  Exercise : pickle ball  Wants to add strength training   Goes to dermatology regularly  Has had some skin cancers and pre cancers Almost did a blue light treatment - will re schedule that  Moh's on her leg in the past   Is starting to use sunscreen   Colonoscopy 10/2011  Declines further     BP Readings from Last 3 Encounters:  12/19/22 122/68  11/07/22 116/70   08/05/22 134/80    Mood    10/24/2022    2:10 PM 10/09/2021    8:26 AM 09/07/2020    2:23 PM 09/02/2019    3:28 PM 08/20/2018   10:49 AM  Depression screen PHQ 2/9  Decreased Interest 0 0 0 0 0  Down, Depressed, Hopeless 0 0 0 0 0  PHQ - 2 Score 0 0 0 0 0  Altered sleeping     0  Tired, decreased energy     0  Change in appetite     0  Feeling bad or failure about yourself      0  Trouble concentrating     0  Moving slowly or fidgety/restless     0  Suicidal thoughts     0  PHQ-9 Score     0  Difficult doing work/chores     Not difficult at all    Zoloft Trazodone    GERD Protonix 40 mg    Low B12  Lab Results  Component Value Date   VITAMINB12 255 12/11/2022  Taking multi vit   Prediabetes Lab Results  Component Value Date   HGBA1C 5.5 12/11/2022    Hyperlipidemia Lab Results  Component Value Date   CHOL 168 12/11/2022   CHOL 179 10/09/2021   CHOL 182 10/30/2020   Lab Results  Component Value Date   HDL 58.80 12/11/2022   HDL 54.20 10/09/2021   HDL 59.90 10/30/2020   Lab Results  Component Value Date   LDLCALC 77  12/11/2022   LDLCALC 99 10/09/2021   LDLCALC 98 10/30/2020   Lab Results  Component Value Date   TRIG 158.0 (H) 12/11/2022   TRIG 130.0 10/09/2021   TRIG 123.0 10/30/2020   Lab Results  Component Value Date   CHOLHDL 3 12/11/2022   CHOLHDL 3 10/09/2021   CHOLHDL 3 10/30/2020   Lab Results  Component Value Date   LDLDIRECT 161.5 10/24/2013   LDLDIRECT 153.0 10/15/2012   Improved cholesterol Eating well   Crestor 5  Lab Results  Component Value Date   WBC 6.0 12/12/2022   HGB 12.4 12/12/2022   HCT 37.9 12/12/2022   MCV 84.4 12/12/2022   PLT 278 12/12/2022   Lab Results  Component Value Date   TSH 1.88 12/11/2022     Patient Active Problem List   Diagnosis Date Noted   Hair loss 08/05/2022   Brittle nails 08/05/2022   Low vitamin B12 level 08/05/2022   Change in voice 06/08/2022   Burning sensation  02/03/2022   Estrogen deficiency 09/07/2020   Sprain of ankle 06/09/2018   Laryngopharyngeal reflux (LPR) 01/08/2017   Pharyngeal dysphagia 01/08/2017   Prediabetes 08/25/2016   Routine general medical examination at a health care facility 08/19/2016   Stress reaction 09/17/2015   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   Rosacea 12/27/2014   Pulmonary HTN ([place]) 10/20/2014   DOE (dyspnea on exertion) 09/26/2014   PVCs (premature ventricular contractions) 09/26/2014   Encounter for Medicare annual wellness exam 03/18/2014   Fatigue 05/31/2012   Fibromyalgia 05/31/2012   Special screening for malignant neoplasms, colon 09/26/2011   GERD (gastroesophageal reflux disease) 07/08/2011   CERVICAL RADICULOPATHY, RIGHT 09/04/2010   PULMONARY NODULE 09/14/2009   PALPITATIONS 08/17/2009   Vitamin D deficiency 02/22/2009   HYPERCHOLESTEROLEMIA 02/22/2009   Adjustment disorder with mixed anxiety and depressed mood 02/22/2009   ALLERGIC RHINITIS 02/22/2009   MENOPAUSAL SYNDROME 02/22/2009   Osteopenia 02/22/2009   Past Medical History:  Diagnosis Date   Allergy    all year   Anxiety    Bursitis of hip    Cataract    Cervical cancer [place]   Chronic kidney disease    RENAL INSUFF   Depression    Dysrhythmia    PVC'S   Fibromyalgia    GERD (gastroesophageal reflux disease)    High cholesterol    History of Clostridium difficile infection    In past from augmentin   History of hiatal hernia    Osteopenia    Palpitations    Panic disorder    Pulmonary HTN ([place]) 10/20/2014   moderate with PASP 71mHg   Pulmonary nodule    Being observed   Vitamin D deficiency    Past Surgical History:  Procedure Laterality Date   ABDOMINAL HYSTERECTOMY  1978   CARDIAC CATHETERIZATION     CATARACT EXTRACTION W/PHACO Right 07/14/2017   Procedure: CATARACT EXTRACTION PHACO AND INTRAOCULAR LENS PLACEMENT ([place];  Surgeon: PBirder Robson MD;  Location: [facility] ORS;  Service: Ophthalmology;   Laterality: Right;  [place]:31 AP% 15.7 CDE 4.97 Fluid pack lot # [id number]   CATARACT EXTRACTION W/PHACO Left 08/04/2017   Procedure: CATARACT EXTRACTION PHACO AND INTRAOCULAR LENS PLACEMENT (IOC);  Surgeon: PBirder Robson MD;  Location: [facility] ORS;  Service: Ophthalmology;  Laterality: Left;  [place]:34 AP% 18.7 CDE 6.42 Fluid pack lot # [id number]   COLONOSCOPY  2002   Normal   HAND SURGERY  2005   Left hand thrombosis   NASAL HEMORRHAGE CONTROL  2007   RIGHT HEART CATHETERIZATION N/A 03/16/2015   Procedure: RIGHT HEART CATH;  Surgeon: Larey Dresser, MD;  Location: [facility] CATH LAB;  Service: Cardiovascular;  Laterality: N/A;   SHOULDER SURGERY  2008   Right shoulder bone spur   THYROGLOSSAL DUCT CYST     Social History   Tobacco Use   Smoking status: Former    Packs/day: 1.00    Years: 17.00    Total pack years: 17.00    Types: Cigarettes    Quit date: 12/08/1978    Years since quitting: 44.0   Smokeless tobacco: Never  Vaping Use   Vaping Use: Never used  Substance Use Topics   Alcohol use: No    Alcohol/week: 0.0 standard drinks of alcohol   Drug use: No   Family History  Problem Relation Age of Onset   Heart attack Mother 62   Emphysema Mother    Allergies Mother    Asthma Mother    Allergies Father    Glaucoma Father    Arrhythmia Sister        Atrial fibrillation   Cancer Sister        breast   Breast cancer Sister    Emphysema Sister    Heart disease Sister    Rheumatologic disease Sister    Lung cancer Sister    Diabetes Other        Parent   Hyperlipidemia Other        Other relative   Hypertension Other        Parent, other relative   Breast cancer Other        Other relative, sister   Allergies Other        Siblings   Asthma Other        Sisters   Colon cancer Neg Hx    Esophageal cancer Neg Hx    Rectal cancer Neg Hx    Stomach cancer Neg Hx    Allergies  Allergen Reactions   Adhesive [Tape] Rash and Other (See Comments)    "Burns" the skin    Amoxicillin-Pot Clavulanate Other (See Comments)    Resulted in C-DIFF!!!!   Paxlovid [Nirmatrelvir-Ritonavir] Anaphylaxis    Throat swelling    Macrobid [Nitrofurantoin] Diarrhea    Severe abdominal cramping   Simvastatin Other (See Comments)    Caused severe muscle pain   Current Outpatient Medications on File Prior to Visit  Medication Sig Dispense Refill   benzonatate (TESSALON) 200 MG capsule Take 1 capsule (200 mg total) by mouth 3 (three) times daily as needed. 30 capsule 1   chlorpheniramine (CHLOR-TRIMETON) 4 MG tablet Take 8 mg by mouth 2 (two) times daily.     LIOTHYRONINE SODIUM PO Take 1 tablet by mouth in the morning.     NONFORMULARY OR COMPOUNDED ITEM compound testosterone cream     OVER THE COUNTER MEDICATION Take 5-7 mLs by mouth See admin instructions. Life Tones (uric acid cleanser- has celery extract and other herbs) liquid: Take 5-7 ml's by mouth once a day     pantoprazole (PROTONIX) 40 MG tablet TAKE 1 TABLET BY MOUTH EVERY DAY AS NEEDED FOR REFLUX 90 tablet 0   progesterone (PROMETRIUM) 100 MG capsule Take 100 mg by mouth at bedtime.     No current facility-administered medications on file prior to visit.      Review of Systems  Constitutional:  Negative for activity change, appetite change, fatigue, fever and unexpected weight change.  HENT:  Negative  for congestion, ear pain, rhinorrhea, sinus pressure and sore throat.   Eyes:  Negative for pain, redness and visual disturbance.  Respiratory:  Negative for cough, shortness of breath and wheezing.   Cardiovascular:  Negative for chest pain and palpitations.  Gastrointestinal:  Negative for abdominal pain, blood in stool, constipation and diarrhea.  Endocrine: Negative for polydipsia and polyuria.  Genitourinary:  Negative for dysuria, frequency and urgency.  Musculoskeletal:  Negative for arthralgias, back pain and myalgias.  Skin:  Negative for pallor and rash.  Allergic/Immunologic: Negative for  environmental allergies.  Neurological:  Negative for dizziness, syncope and headaches.  Hematological:  Negative for adenopathy. Does not bruise/bleed easily.  Psychiatric/Behavioral:  Negative for decreased concentration and dysphoric mood. The patient is not nervous/anxious.        Objective:   Physical Exam Constitutional:      General: She is not in acute distress.    Appearance: Normal appearance. She is well-developed and normal weight. She is not ill-appearing or diaphoretic.  HENT:     Head: Normocephalic and atraumatic.     Right Ear: Tympanic membrane, ear canal and external ear normal.     Left Ear: Tympanic membrane, ear canal and external ear normal.     Nose: Nose normal. No congestion.     Mouth/Throat:     Mouth: Mucous membranes are moist.     Pharynx: Oropharynx is clear. No posterior oropharyngeal erythema.  Eyes:     General: No scleral icterus.    Extraocular Movements: Extraocular movements intact.     Conjunctiva/sclera: Conjunctivae normal.     Pupils: Pupils are equal, round, and reactive to light.  Neck:     Thyroid: No thyromegaly.     Vascular: No carotid bruit or JVD.  Cardiovascular:     Rate and Rhythm: Normal rate and regular rhythm.     Pulses: Normal pulses.     Heart sounds: Normal heart sounds.     No gallop.  Pulmonary:     Effort: Pulmonary effort is normal. No respiratory distress.     Breath sounds: Normal breath sounds. No wheezing.     Comments: Good air exch Chest:     Chest wall: No tenderness.  Abdominal:     General: Bowel sounds are normal. There is no distension or abdominal bruit.     Palpations: Abdomen is soft. There is no mass.     Tenderness: There is no abdominal tenderness.     Hernia: No hernia is present.  Genitourinary:    Comments: Breast exam: No mass, nodules, thickening, tenderness, bulging, retraction, inflamation, nipple discharge or skin changes noted.  No axillary or clavicular LA.     Musculoskeletal:         General: No tenderness. Normal range of motion.     Cervical back: Normal range of motion and neck supple. No rigidity. No muscular tenderness.     Right lower leg: No edema.     Left lower leg: No edema.     Comments: No kyphosis   Lymphadenopathy:     Cervical: No cervical adenopathy.  Skin:    General: Skin is warm and dry.     Coloration: Skin is not pale.     Findings: No erythema or rash.     Comments: Solar lentigines diffusely Scattered SKs  Neurological:     Mental Status: She is alert. Mental status is at baseline.     Cranial Nerves: No cranial nerve deficit.  Motor: No abnormal muscle tone.     Coordination: Coordination normal.     Gait: Gait normal.     Deep Tendon Reflexes: Reflexes are normal and symmetric. Reflexes normal.  Psychiatric:        Mood and Affect: Mood normal.        Cognition and Memory: Cognition and memory normal.           Assessment & Plan:   Problem List Items Addressed This Visit       Cardiovascular and Mediastinum   Pulmonary HTN ([place])    Bp controlled  No clinical changes       Relevant Medications   rosuvastatin (CRESTOR) 5 MG tablet     Digestive   GERD (gastroesophageal reflux disease)    Protonix 40 mg daily   B12 is in the low normal range       Relevant Medications   famotidine (PEPCID) 20 MG tablet     Musculoskeletal and Integument   Osteopenia    Dexa 04/2021  No falls or fractures   Taking vit D Good exercise, plans to add more strength training           Other   Adjustment disorder with mixed anxiety and depressed mood    Stable and doing well with zoloft and traaodone Elects to continue Good exercise and swelf care       HYPERCHOLESTEROLEMIA    Disc goals for lipids and reasons to control them Rev last labs with pt Rev low sat fat diet in detail  Plan to continue crestor 5 mg daily  LDL improved at 77       Relevant Medications   rosuvastatin (CRESTOR) 5 MG tablet   Low  vitamin B12 level    Lab Results  Component Value Date   VITAMINB12 255 12/11/2022  Takes a ppi On mvi Enc her to add [phone] mcg of B12 daily      Prediabetes    Lab Results  Component Value Date   HGBA1C 5.5 12/11/2022  disc imp of low glycemic diet and wt loss to prevent DM2       Routine general medical examination at a health care facility - Primary    Reviewed health habits including diet and exercise and skin cancer prevention Reviewed appropriate screening tests for age  Also reviewed health mt list, fam hx and immunization status , as well as social and family history   Mammogram utd 07/2022 Continues hormonal tx with blue sky and understands risks Dexa utd 04/2021  Taking vir d , no falls or fractures  Regular dermatology care Colonoscopy 2021 and declines further screening of any kind PHq score of 0         Vitamin D deficiency    D level of 64   Vitamin D level is therapeutic with current supplementation Disc importance of this to bone and overall health

## 2022-12-21 NOTE — Assessment & Plan Note (Signed)
D level of 64   Vitamin D level is therapeutic with current supplementation Disc importance of this to bone and overall health

## 2022-12-21 NOTE — Progress Notes (Unsigned)
    Mary Gordon T. Mannix Kroeker, MD, [place] at [facility] [place], [address]  Phone: [phone]  FAX: [phone]  Mary Gordon - 77 y.o. female  MRN [id number]  Date of Birth: Sep 15, 1946  Date: 12/22/2022  PCP: Abner Greenspan, MD  Referral: Abner Greenspan, MD  No chief complaint on file.  Subjective:   Mary Gordon is a 77 y.o. very pleasant female patient with There is no height or weight on file to calculate BMI. who presents with the following:  Patient presents with trigger finger.    Review of Systems is noted in the HPI, as appropriate  Objective:   There were no vitals taken for this visit.  GEN: No acute distress; alert,appropriate. PULM: Breathing comfortably in no respiratory distress PSYCH: Normally interactive.   Laboratory and Imaging Data:  Assessment and Plan:   ***

## 2022-12-21 NOTE — Assessment & Plan Note (Signed)
Lab Results  Component Value Date   HGBA1C 5.5 12/11/2022   disc imp of low glycemic diet and wt loss to prevent DM2

## 2022-12-21 NOTE — Assessment & Plan Note (Signed)
Reviewed health habits including diet and exercise and skin cancer prevention Reviewed appropriate screening tests for age  Also reviewed health mt list, fam hx and immunization status , as well as social and family history   Mammogram utd 07/2022 Continues hormonal tx with blue sky and understands risks Dexa utd 04/2021  Taking vir d , no falls or fractures  Regular dermatology care Colonoscopy 2021 and declines further screening of any kind PHq score of 0

## 2022-12-21 NOTE — Assessment & Plan Note (Signed)
Protonix 40 mg daily   B12 is in the low normal range

## 2022-12-21 NOTE — Assessment & Plan Note (Signed)
Dexa 04/2021  No falls or fractures   Taking vit D Good exercise, plans to add more strength training

## 2022-12-21 NOTE — Assessment & Plan Note (Signed)
Stable and doing well with zoloft and traaodone Elects to continue Good exercise and swelf care

## 2022-12-21 NOTE — Assessment & Plan Note (Signed)
Lab Results  Component Value Date   VITAMINB12 255 12/11/2022   Takes a ppi On mvi Enc her to add [phone] mcg of B12 daily

## 2022-12-21 NOTE — Assessment & Plan Note (Signed)
Disc goals for lipids and reasons to control them Rev last labs with pt Rev low sat fat diet in detail  Plan to continue crestor 5 mg daily  LDL improved at 77

## 2022-12-21 NOTE — Assessment & Plan Note (Signed)
Bp controlled  No clinical changes

## 2022-12-22 ENCOUNTER — Encounter: Payer: Self-pay | Admitting: Family Medicine

## 2022-12-22 ENCOUNTER — Ambulatory Visit (INDEPENDENT_AMBULATORY_CARE_PROVIDER_SITE_OTHER): Payer: PPO | Admitting: Family Medicine

## 2022-12-22 VITALS — BP 120/74 | HR 82 | Temp 98.4°F | Ht 63.75 in | Wt 160.2 lb

## 2022-12-22 DIAGNOSIS — M65341 Trigger finger, right ring finger: Secondary | ICD-10-CM | POA: Diagnosis not present

## 2022-12-22 MED ORDER — TRIAMCINOLONE ACETONIDE 40 MG/ML IJ SUSP
20.0000 mg | Freq: Once | INTRAMUSCULAR | Status: AC
  Administered 2022-12-22: 20 mg via INTRA_ARTICULAR

## 2022-12-22 NOTE — Addendum Note (Signed)
Addended by: Carter Kitten on: 12/22/2022 12:39 PM   Modules accepted: Orders

## 2023-01-22 DIAGNOSIS — L578 Other skin changes due to chronic exposure to nonionizing radiation: Secondary | ICD-10-CM | POA: Diagnosis not present

## 2023-01-22 DIAGNOSIS — L570 Actinic keratosis: Secondary | ICD-10-CM | POA: Diagnosis not present

## 2023-02-25 DIAGNOSIS — L578 Other skin changes due to chronic exposure to nonionizing radiation: Secondary | ICD-10-CM | POA: Diagnosis not present

## 2023-02-25 DIAGNOSIS — L570 Actinic keratosis: Secondary | ICD-10-CM | POA: Diagnosis not present

## 2023-03-03 ENCOUNTER — Other Ambulatory Visit: Payer: Self-pay | Admitting: Family Medicine

## 2023-03-24 DIAGNOSIS — N951 Menopausal and female climacteric states: Secondary | ICD-10-CM | POA: Diagnosis not present

## 2023-03-24 DIAGNOSIS — E039 Hypothyroidism, unspecified: Secondary | ICD-10-CM | POA: Diagnosis not present

## 2023-03-26 DIAGNOSIS — E039 Hypothyroidism, unspecified: Secondary | ICD-10-CM | POA: Diagnosis not present

## 2023-03-26 DIAGNOSIS — R232 Flushing: Secondary | ICD-10-CM | POA: Diagnosis not present

## 2023-03-26 DIAGNOSIS — N951 Menopausal and female climacteric states: Secondary | ICD-10-CM | POA: Diagnosis not present

## 2023-03-26 DIAGNOSIS — N898 Other specified noninflammatory disorders of vagina: Secondary | ICD-10-CM | POA: Diagnosis not present

## 2023-04-27 ENCOUNTER — Ambulatory Visit (INDEPENDENT_AMBULATORY_CARE_PROVIDER_SITE_OTHER): Payer: HMO | Admitting: Family Medicine

## 2023-04-27 ENCOUNTER — Encounter: Payer: Self-pay | Admitting: Family Medicine

## 2023-04-27 VITALS — BP 110/70 | HR 78 | Temp 97.8°F | Ht 63.75 in | Wt 163.5 lb

## 2023-04-27 DIAGNOSIS — K5901 Slow transit constipation: Secondary | ICD-10-CM

## 2023-04-27 DIAGNOSIS — R350 Frequency of micturition: Secondary | ICD-10-CM | POA: Diagnosis not present

## 2023-04-27 DIAGNOSIS — N3000 Acute cystitis without hematuria: Secondary | ICD-10-CM

## 2023-04-27 DIAGNOSIS — K5900 Constipation, unspecified: Secondary | ICD-10-CM | POA: Insufficient documentation

## 2023-04-27 LAB — POC URINALSYSI DIPSTICK (AUTOMATED)
Bilirubin, UA: NEGATIVE
Blood, UA: 50 — AB
Glucose, UA: NEGATIVE
Ketones, UA: NEGATIVE
Nitrite, UA: NEGATIVE
Protein, UA: POSITIVE — AB
Spec Grav, UA: 1.01 (ref 1.010–1.025)
Urobilinogen, UA: 0.2 E.U./dL
pH, UA: 6 (ref 5.0–8.0)

## 2023-04-27 MED ORDER — SULFAMETHOXAZOLE-TRIMETHOPRIM 800-160 MG PO TABS: 1.0000 | ORAL_TABLET | Freq: Two times a day (BID) | ORAL | 0 refills | Status: DC

## 2023-04-27 NOTE — Patient Instructions (Addendum)
Drink lots of water Don't hold urine too long  Urinate before and after intercourse if applicable  Take bactrim for 5 days  We will contact you with urine culture result and plan    If you get worse in the meantime let us know    For constipation  Miralax over the counter (store brand)  Try daily to three times daily to control chronic constipation Figure how much you need

## 2023-04-27 NOTE — Assessment & Plan Note (Signed)
Positive ua , freq, dysuria  Last culture noted pan sens e coli She cannot take macrobid or augmentin   Px bactrim / tol well in past Fluids  Discussed ways to prevent utis  Cx pending Instructed to call if worse prior to that Handout given

## 2023-04-27 NOTE — Progress Notes (Signed)
Subjective:    Patient ID: Mary Gordon, female    DOB: [birth year], 77 y.o.   MRN: [id number]  HPI Pt presents for urinary symptoms Constipation with straining   Wt Readings from Last 3 Encounters:  04/27/23 163 lb 8 oz (74.2 kg)  12/22/22 160 lb 4 oz (72.7 kg)  12/19/22 159 lb 4 oz (72.2 kg)   28.29 kg/m Vitals:   04/27/23 1225  BP: 110/70  Pulse: 78  Temp: 97.8 F (36.6 C)  SpO2: 96%   Started 2 weeks ago -was out of town   [employer] urine  Frequency Discomfort to urinate  Some pressure No blood in urine   No fever  No n/v No flank pain   Taking care of 56 yo father in [place]  He is actually in very good shape but has to help    Ua today positive for leuk and blood and protein   Lab Results  Component Value Date   CREATININE 0.80 12/11/2022   BUN 9 12/11/2022   NA 139 12/11/2022   K 4.4 12/11/2022   CL 104 12/11/2022   CO2 28 12/11/2022   Results for orders placed or performed in visit on 04/27/23  POCT Urinalysis Dipstick (Automated)  Result Value Ref Range   Color, UA Yellow    Clarity, UA Cloudy    Glucose, UA Negative Negative   Bilirubin, UA Negative    Ketones, UA Negative    Spec Grav, UA 1.010 1.010 - 1.025   Blood, UA 50 Ery/uL (A)    pH, UA 6.0 5.0 - 8.0   Protein, UA Positive (A) Negative   Urobilinogen, UA 0.2 0.2 or 1.0 E.U./dL   Nitrite, UA Negative    Leukocytes, UA Large (3+) (A) Negative     Constipated lately  Lot of straining Drinks water Uses senna prn  Patient Active Problem List   Diagnosis Date Noted   Constipation 04/27/2023   Hair loss 08/05/2022   Brittle nails 08/05/2022   Low vitamin B12 level 08/05/2022   Change in voice 06/08/2022   Burning sensation 02/03/2022   Estrogen deficiency 09/07/2020   Acute cystitis 10/04/2019   Sprain of ankle 06/09/2018   Laryngopharyngeal reflux (LPR) 01/08/2017   Pharyngeal dysphagia 01/08/2017   Prediabetes 08/25/2016   Routine general medical examination at a  health care facility 08/19/2016   Stress reaction 09/17/2015   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   Rosacea 12/27/2014   Pulmonary HTN (HCC) 10/20/2014   DOE (dyspnea on exertion) 09/26/2014   PVCs (premature ventricular contractions) 09/26/2014   Encounter for Medicare annual wellness exam 03/18/2014   Fatigue 05/31/2012   Fibromyalgia 05/31/2012   Special screening for malignant neoplasms, colon 09/26/2011   GERD (gastroesophageal reflux disease) 07/08/2011   CERVICAL RADICULOPATHY, RIGHT 09/04/2010   PULMONARY NODULE 09/14/2009   PALPITATIONS 08/17/2009   Vitamin D deficiency 02/22/2009   HYPERCHOLESTEROLEMIA 02/22/2009   Adjustment disorder with mixed anxiety and depressed mood 02/22/2009   ALLERGIC RHINITIS 02/22/2009   MENOPAUSAL SYNDROME 02/22/2009   Osteopenia 02/22/2009   Past Medical History:  Diagnosis Date   Allergy    all year   Anxiety    Bursitis of hip    Cataract    Cervical cancer (HCC) 1978   Chronic kidney disease    RENAL INSUFF   Depression    Dysrhythmia    PVC'S   Fibromyalgia    GERD (gastroesophageal reflux disease)    High cholesterol    History of Clostridium  difficile infection    In past from augmentin   History of hiatal hernia    Osteopenia    Palpitations    Panic disorder    Pulmonary HTN (HCC) 10/20/2014   moderate with PASP   Pulmonary nodule    Being observed   Vitamin D deficiency    Past Surgical History:  Procedure Laterality Date   ABDOMINAL HYSTERECTOMY  1978   CARDIAC CATHETERIZATION     CATARACT EXTRACTION W/PHACO Right 07/14/2017   Procedure: CATARACT EXTRACTION PHACO AND INTRAOCULAR LENS PLACEMENT (IOC);  Surgeon: Galen Manila, MD;  Location: [facility] ORS;  Service: Ophthalmology;  Laterality: Right;  [place] 00:31 AP% 15.7 CDE 4.97 Fluid pack lot # [id number]   CATARACT EXTRACTION W/PHACO Left 08/04/2017   Procedure: CATARACT EXTRACTION PHACO AND INTRAOCULAR LENS PLACEMENT (IOC);  Surgeon: Galen Manila, MD;   Location: [facility] OR[facility];  Service: Ophthalmology;  Laterality: Left;  [place] 00:34 AP% 18.7 CDE 6.42 Fluid pack lot # [id number]   COLONOSCOPY  2002   Normal   HAND SURGERY  2005   Left hand thrombosis   NASAL HEMORRHAGE CONTROL  2007   RIGHT HEART CATHETERIZATION N/A 03/16/2015   Procedure: RIGHT HEART CATH;  Surgeon: Laurey Morale, MD;  Location: [facility] CATH LAB;  Service: Cardiovascular;  Laterality: N/A;   SHOULDER SURGERY  2008   Right shoulder bone spur   THYROGLOSSAL DUCT CYST     Social History   Tobacco Use   Smoking status: Former    Packs/day: 1.00    Years: 17.00    Additional pack years: 0.00    Total pack years: 17.00    Types: Cigarettes    Quit date: 12/08/1978    Years since quitting: 44.4   Smokeless tobacco: Never  Vaping Use   Vaping Use: Never used  Substance Use Topics   Alcohol use: No    Alcohol/week: 0.0 standard drinks of alcohol   Drug use: No   Family History  Problem Relation Age of Onset   Heart attack Mother 37   Emphysema Mother    Allergies Mother    Asthma Mother    Allergies Father    Glaucoma Father    Arrhythmia Sister        Atrial fibrillation   Cancer Sister        breast   Breast cancer Sister    Emphysema Sister    Heart disease Sister    Rheumatologic disease Sister    Lung cancer Sister    Diabetes Other        Parent   Hyperlipidemia Other        Other relative   Hypertension Other        Parent, other relative   Breast cancer Other        Other relative, sister   Allergies Other        Siblings   Asthma Other        Sisters   Colon cancer Neg Hx    Esophageal cancer Neg Hx    Rectal cancer Neg Hx    Stomach cancer Neg Hx    Allergies  Allergen Reactions   Adhesive [Tape] Rash and Other (See Comments)    "Burns" the skin   Amoxicillin-Pot Clavulanate Other (See Comments)    Resulted in C-DIFF!!!!   Paxlovid [Nirmatrelvir-Ritonavir] Anaphylaxis    Throat swelling    Macrobid [Nitrofurantoin] Diarrhea    Severe  abdominal cramping   Simvastatin Other (See Comments)  Caused severe muscle pain   Current Outpatient Medications on File Prior to Visit  Medication Sig Dispense Refill   benzonatate (TESSALON) 200 MG capsule Take 1 capsule (200 mg total) by mouth 3 (three) times daily as needed. 30 capsule 1   chlorpheniramine (CHLOR-TRIMETON) 4 MG tablet Take 8 mg by mouth 2 (two) times daily.     famotidine (PEPCID) 20 MG tablet Take 1 tablet (20 mg total) by mouth 2 (two) times daily. 180 tablet 3   NONFORMULARY OR COMPOUNDED ITEM compound testosterone cream     OVER THE COUNTER MEDICATION Take 5-7 mLs by mouth See admin instructions. Life Tones (uric acid cleanser- has celery extract and other herbs) liquid: Take 5-7 ml's by mouth once a day     pantoprazole (PROTONIX) 40 MG tablet TAKE 1 TABLET BY MOUTH EVERY DAY AS NEEDED FOR REFLUX 90 tablet 2   progesterone (PROMETRIUM) 100 MG capsule Take 100 mg by mouth at bedtime.     rosuvastatin (CRESTOR) 5 MG tablet TAKE 1 TABLET BY MOUTH TWICE A WEEK ON MONDAY/THURSDAY 24 tablet 3   sertraline (ZOLOFT) 50 MG tablet Take 1 tablet (50 mg total) by mouth daily. 90 tablet 3   traZODone (DESYREL) 100 MG tablet TAKE 1 TABLET BY MOUTH EVERYDAY AT BEDTIME 90 tablet 3   No current facility-administered medications on file prior to visit.      Review of Systems  Constitutional:  Positive for fatigue. Negative for activity change, appetite change and fever.  HENT:  Negative for congestion and sore throat.   Eyes:  Negative for itching and visual disturbance.  Respiratory:  Negative for cough and shortness of breath.   Cardiovascular:  Negative for leg swelling.  Gastrointestinal:  Positive for constipation. Negative for abdominal distention, abdominal pain, diarrhea and nausea.  Endocrine: Negative for cold intolerance and polydipsia.  Genitourinary:  Positive for dysuria, frequency and urgency. Negative for difficulty urinating, flank pain and hematuria.   Musculoskeletal:  Negative for myalgias.  Skin:  Negative for rash.  Allergic/Immunologic: Negative for immunocompromised state.  Neurological:  Negative for dizziness and weakness.  Hematological:  Negative for adenopathy.       Objective:   Physical Exam Constitutional:      General: She is not in acute distress.    Appearance: Normal appearance. She is well-developed and normal weight. She is not ill-appearing or diaphoretic.  HENT:     Head: Normocephalic and atraumatic.  Eyes:     Conjunctiva/sclera: Conjunctivae normal.     Pupils: Pupils are equal, round, and reactive to light.  Cardiovascular:     Rate and Rhythm: Normal rate and regular rhythm.     Heart sounds: Normal heart sounds.  Pulmonary:     Effort: Pulmonary effort is normal.     Breath sounds: Normal breath sounds.  Abdominal:     General: Bowel sounds are normal. There is no distension.     Palpations: Abdomen is soft.     Tenderness: There is abdominal tenderness. There is no right CVA tenderness, left CVA tenderness or rebound.     Comments: No cva tenderness  Mild suprapubic tenderness  Musculoskeletal:     Cervical back: Normal range of motion and neck supple.  Lymphadenopathy:     Cervical: No cervical adenopathy.  Skin:    Findings: No rash.  Neurological:     Mental Status: She is alert.           Assessment & Plan:   Problem List  Items Addressed This Visit       Genitourinary   Acute cystitis - Primary    Positive ua , freq, dysuria  Last culture noted pan sens e coli She cannot take macrobid or augmentin   Px bactrim / tol well in past Fluids  Discussed ways to prevent utis  Cx pending Instructed to call if worse prior to that Handout given       Relevant Orders   Urine Culture     Other   Constipation    Intermittent constipation with straining May make utis worse Discussed strategy of fluids and high fiber foods  Discussed use of miralax 1-3 times daily and  titrate to need Update if not starting to improve in a week or if worsening        Other Visit Diagnoses     Urinary frequency       Relevant Orders   POCT Urinalysis Dipstick (Automated) (Completed)

## 2023-04-27 NOTE — Assessment & Plan Note (Signed)
Intermittent constipation with straining May make utis worse Discussed strategy of fluids and high fiber foods  Discussed use of miralax 1-3 times daily and titrate to need Update if not starting to improve in a week or if worsening

## 2023-04-30 ENCOUNTER — Encounter: Payer: Self-pay | Admitting: Family Medicine

## 2023-04-30 ENCOUNTER — Telehealth: Payer: Self-pay | Admitting: Family Medicine

## 2023-04-30 LAB — URINE CULTURE
MICRO NUMBER:: 14978536
SPECIMEN QUALITY:: ADEQUATE

## 2023-04-30 MED ORDER — CIPROFLOXACIN HCL 250 MG PO TABS: 250.0000 mg | ORAL_TABLET | Freq: Two times a day (BID) | ORAL | 0 refills | Status: AC

## 2023-04-30 NOTE — Telephone Encounter (Signed)
-----   Message from Judy Pimple, MD sent at 04/30/2023  2:04 PM EDT ----- Urine cx grew out bacteria called pseudomonas  Bactrim was not on list of sensitivity testing How are your symptoms?

## 2023-04-30 NOTE — Telephone Encounter (Signed)
Patient returned call regarding lab results. Would like a call back 

## 2023-04-30 NOTE — Telephone Encounter (Signed)
I sent in cipro  Take twice daily for 5 days  Alert [place] if not improving

## 2023-04-30 NOTE — Telephone Encounter (Signed)
Pt notified of urine cx results and Dr. Royden Purl comments. Pt said she is still having sxs and still dealing with dysuria. Pt thinks abx may need to be changed. Pt is if PCP sends a new abx in her pharmacy will let her know when it's ready. Pt advise if she starts a new abx and sxs don't improve to let us know. Pt verbalized understanding.  [facility]/ [address]

## 2023-05-20 NOTE — Progress Notes (Signed)
Mary Hornbaker T. Jailey Booton, MD, CAQ Sports Medicine [facility] [address] [place], [address]  Phone: [phone]  FAX: [phone]  Mary Gordon - 77 y.o. female  MRN [id number]  Date of Birth: 07/30/1946  Date: 05/21/2023  PCP: Mary Pimple, MD  Referral: Mary Pimple, MD  Chief Complaint  Patient presents with   Knee Pain    Left   Subjective:   Mary Gordon is a 77 y.o. very pleasant female patient with Body mass index is 28.91 kg/m. who presents with the following:  Patient presents with ongoing left-sided knee pain.  She has had some in her mid and right-sided knee pain off and on, and right now she has more in the medial compartment and to a lesser extent the lateral compartment of the knee.  She has not had an effusion, she has not had any mechanical symptoms, functional giving way, or locking up of the joint.  At baseline she is quite active, and she rarely if ever takes anything like NSAIDs or Tylenol.  Hurts a lot and she has not been able to play pickleball.  Medial and lateral joint line tenderness  [place] - leaving in about 10 days . Does not take NSAIDS  Inject left knee    Review of Systems is noted in the HPI, as appropriate  Objective:   BP 90/60 (BP Location: Left Arm, Patient Position: Sitting, Cuff Size: Large)   Pulse 66   Temp (!) 97.3 F (36.3 C) (Temporal)   Ht 5' 3.75" (1.619 m)   Wt 167 lb 2 oz (75.8 kg)   SpO2 98%   BMI 28.91 kg/m   GEN: No acute distress; alert,appropriate. PULM: Breathing comfortably in no respiratory distress PSYCH: Normally interactive.   Left knee: Full extension and flexion to 125 Excellent movement at the patella with minimal patellar facet tenderness She does have notable medial greater than lateral joint line tenderness Stable to varus and valgus stress, ACL, PCL are intact She does have some pain with McMurray's and flexion pinch  testing  Laboratory and Imaging Data:  Assessment and Plan:     ICD-10-CM   1. Chronic pain of left knee  M25.562    G89.29      She does not have any weightbearing knee films, she does have a nonweightbearing trauma series, and on that series there is minimal apparent OA.  Dedicated weightbearing knee films will be preferable and helpful in this case in the future.  My suspicion is given her clinical scenario and pain at the medial and lateral joint lines that she does have some degenerative change.  Aspiration/Injection Procedure Note Mary Gordon December 01, 1946 Date of procedure: 05/21/2023  Procedure: Large Joint Aspiration / Injection of Knee, L Indications: Pain  Procedure Details Patient verbally consented to procedure. Risks, benefits, and alternatives explained. Sterilely prepped with Chloraprep. Ethyl cholride used for anesthesia. 9 cc Lidocaine 1% mixed with 1 mL of Kenalog 40 mg injected using the anteromedial approach without difficulty. No complications with procedure and tolerated well. Patient had decreased pain post-injection. Medication: 1 mL of Kenalog 40 mg    Disposition: No follow-ups on file.  [employer] speech-to-text software was used for transcription in this dictation.  Possible transcriptional errors can occur using Animal nutritionist.   Signed,  Mary Gordon. Mary Baxendale, MD   Outpatient Encounter Medications as of 05/21/2023  Medication Sig   benzonatate (TESSALON) 200 MG capsule Take  1 capsule (200 mg total) by mouth 3 (three) times daily as needed.   chlorpheniramine (CHLOR-TRIMETON) 4 MG tablet Take 8 mg by mouth 2 (two) times daily.   famotidine (PEPCID) 20 MG tablet Take 1 tablet (20 mg total) by mouth 2 (two) times daily.   NONFORMULARY OR COMPOUNDED ITEM compound testosterone cream   OVER THE COUNTER MEDICATION Take 5-7 mLs by mouth See admin instructions. Life Tones (uric acid cleanser- has celery extract and other herbs) liquid: Take  5-7 ml's by mouth once a day   pantoprazole (PROTONIX) 40 MG tablet TAKE 1 TABLET BY MOUTH EVERY DAY AS NEEDED FOR REFLUX   progesterone (PROMETRIUM) 100 MG capsule Take 100 mg by mouth at bedtime.   rosuvastatin (CRESTOR) 5 MG tablet TAKE 1 TABLET BY MOUTH TWICE A WEEK ON MONDAY/THURSDAY   sertraline (ZOLOFT) 50 MG tablet Take 1 tablet (50 mg total) by mouth daily.   traZODone (DESYREL) 100 MG tablet TAKE 1 TABLET BY MOUTH EVERYDAY AT BEDTIME   No facility-administered encounter medications on file as of 05/21/2023.

## 2023-05-21 ENCOUNTER — Encounter: Payer: Self-pay | Admitting: Family Medicine

## 2023-05-21 ENCOUNTER — Ambulatory Visit (INDEPENDENT_AMBULATORY_CARE_PROVIDER_SITE_OTHER): Payer: HMO | Admitting: Family Medicine

## 2023-05-21 VITALS — BP 90/60 | HR 66 | Temp 97.3°F | Ht 63.75 in | Wt 167.1 lb

## 2023-05-21 DIAGNOSIS — G8929 Other chronic pain: Secondary | ICD-10-CM

## 2023-05-21 DIAGNOSIS — M25562 Pain in left knee: Secondary | ICD-10-CM | POA: Diagnosis not present

## 2023-05-21 MED ORDER — TRIAMCINOLONE ACETONIDE 40 MG/ML IJ SUSP
40.0000 mg | Freq: Once | INTRAMUSCULAR | Status: AC
  Administered 2023-05-21: 40 mg via INTRA_ARTICULAR

## 2023-05-21 NOTE — Addendum Note (Signed)
Addended by: Damita Lack on: 05/21/2023 03:36 PM   Modules accepted: Orders

## 2023-06-23 DIAGNOSIS — N951 Menopausal and female climacteric states: Secondary | ICD-10-CM | POA: Diagnosis not present

## 2023-06-23 DIAGNOSIS — E039 Hypothyroidism, unspecified: Secondary | ICD-10-CM | POA: Diagnosis not present

## 2023-06-25 DIAGNOSIS — R232 Flushing: Secondary | ICD-10-CM | POA: Diagnosis not present

## 2023-06-25 DIAGNOSIS — R5383 Other fatigue: Secondary | ICD-10-CM | POA: Diagnosis not present

## 2023-06-25 DIAGNOSIS — N951 Menopausal and female climacteric states: Secondary | ICD-10-CM | POA: Diagnosis not present

## 2023-06-25 DIAGNOSIS — E039 Hypothyroidism, unspecified: Secondary | ICD-10-CM | POA: Diagnosis not present

## 2023-06-25 DIAGNOSIS — Z6828 Body mass index (BMI) 28.0-28.9, adult: Secondary | ICD-10-CM | POA: Diagnosis not present

## 2023-07-08 ENCOUNTER — Other Ambulatory Visit: Payer: Self-pay | Admitting: Family Medicine

## 2023-07-08 DIAGNOSIS — Z1231 Encounter for screening mammogram for malignant neoplasm of breast: Secondary | ICD-10-CM

## 2023-07-23 DIAGNOSIS — H26492 Other secondary cataract, left eye: Secondary | ICD-10-CM | POA: Diagnosis not present

## 2023-07-29 ENCOUNTER — Ambulatory Visit (INDEPENDENT_AMBULATORY_CARE_PROVIDER_SITE_OTHER)
Admission: RE | Admit: 2023-07-29 | Discharge: 2023-07-29 | Disposition: A | Discharge status: Home / Self Care | Payer: HMO | Source: Ambulatory Visit | Attending: Family Medicine | Admitting: Family Medicine

## 2023-07-29 ENCOUNTER — Ambulatory Visit (INDEPENDENT_AMBULATORY_CARE_PROVIDER_SITE_OTHER): Payer: HMO | Admitting: Family Medicine

## 2023-07-29 ENCOUNTER — Encounter: Payer: Self-pay | Admitting: Family Medicine

## 2023-07-29 VITALS — BP 128/86 | HR 95 | Temp 97.8°F | Ht 63.75 in | Wt 164.1 lb

## 2023-07-29 DIAGNOSIS — R053 Chronic cough: Secondary | ICD-10-CM

## 2023-07-29 DIAGNOSIS — K219 Gastro-esophageal reflux disease without esophagitis: Secondary | ICD-10-CM

## 2023-07-29 DIAGNOSIS — I272 Pulmonary hypertension, unspecified: Secondary | ICD-10-CM

## 2023-07-29 DIAGNOSIS — E2839 Other primary ovarian failure: Secondary | ICD-10-CM

## 2023-07-29 DIAGNOSIS — R059 Cough, unspecified: Secondary | ICD-10-CM | POA: Diagnosis not present

## 2023-07-29 DIAGNOSIS — R1313 Dysphagia, pharyngeal phase: Secondary | ICD-10-CM

## 2023-07-29 MED ORDER — PANTOPRAZOLE SODIUM 40 MG PO TBEC: 40.0000 mg | DELAYED_RELEASE_TABLET | Freq: Two times a day (BID) | ORAL | 1 refills | Status: DC

## 2023-07-29 NOTE — Assessment & Plan Note (Signed)
May be reflex related Will get more aggressive with treatment  Ref to ENT

## 2023-07-29 NOTE — Progress Notes (Signed)
Subjective:    Patient ID: Mary Gordon, female    DOB: 07/25/1946, 77 y.o.   MRN: [id number]  HPI  Wt Readings from Last 3 Encounters:  07/29/23 164 lb 2 oz (74.4 kg)  05/21/23 167 lb 2 oz (75.8 kg)  04/27/23 163 lb 8 oz (74.2 kg)   28.39 kg/m  Vitals:   07/29/23 1055  BP: 128/86  Pulse: 95  Temp: 97.8 F (36.6 C)  SpO2: 92%    Pt presents for 2 weeks of cough   Started with a tickle in her throat  Like an irritation  Feels it more in the right side of throat   Dry hacking cough  Baseline a little green mucous n am  No chest congestion that she can tell No wheezing  No shortness of breath   Runny and stuffy nose are her baseline  Has been indoors more for past few weeks  Right ear feels pressure / not pain   Throat is a little sore / early on felt swollen   A little hoarse   No fever   Acid reflux  Has had long time  Cannot eat before she goes to bed More heartburn lately   No particular food groups trigger   Started a weight loss program last week / some features of keto but not totally  Has lost 6 lb   Pepcid - 20 mg takes at night  Protonix in the am 40 mg   Some swallowing issues in throat    Past surgery ENT Has always had issues swallowing issues on right after removal of thyroglossal cyst     No n/v/d No abd pain   Allergy treatment  Chlorpheniramine -works better than other antihistamines In past the steroid ns give her ST    Has afrin for allergies- once in a blue moon   Cxr DG Chest 2 View  Result Date: 07/29/2023 CLINICAL DATA:  Cough for 2 weeks.  Acid reflux. EXAM: CHEST - 2 VIEW COMPARISON:  Chest radiographs and chest CTA 12/08/2018 FINDINGS: The cardiomediastinal silhouette is unchanged with normal heart size. The lungs are hyperinflated with chronic interstitial coarsening. No acute airspace consolidation, overt pulmonary edema, pleural effusion, or pneumothorax is identified. Mild chronic midthoracic vertebral  compression fractures are again noted. IMPRESSION: No evidence of acute cardiopulmonary process. Electronically Signed   By: Sebastian Ache M.D.   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   Rosacea 12/27/2014   DOE (dyspnea on exertion) 09/26/2014   PVCs (premature ventricular contractions) 09/26/2014   Encounter for Medicare annual wellness exam 03/18/2014   Fatigue 05/31/2012   Fibromyalgia 05/31/2012   Special screening for malignant neoplasms, colon 09/26/2011   GERD (gastroesophageal reflux disease) 07/08/2011   CERVICAL RADICULOPATHY, RIGHT 09/04/2010   PULMONARY NODULE 09/14/2009   PALPITATIONS 08/17/2009   Vitamin D deficiency 02/22/2009   HYPERCHOLESTEROLEMIA 02/22/2009   Adjustment disorder with mixed anxiety and depressed  mood 02/22/2009   ALLERGIC RHINITIS 02/22/2009   MENOPAUSAL SYNDROME 02/22/2009   Osteopenia 02/22/2009   Past Medical History:  Diagnosis Date   Allergy    all year   Anxiety    Bursitis of hip    Cataract    Cervical cancer (HCC) 1978   Chronic kidney disease    RENAL INSUFF   Depression    Dysrhythmia    PVC'S   Fibromyalgia    GERD (gastroesophageal reflux disease)    High cholesterol    History of Clostridium difficile infection    In past from augmentin   History of hiatal hernia    Osteopenia    Palpitations    Panic disorder    Pulmonary HTN (HCC) 10/20/2014   moderate with PASP    Pulmonary nodule    Being observed   Vitamin D deficiency    Past Surgical History:  Procedure Laterality Date   ABDOMINAL HYSTERECTOMY  1978   CARDIAC CATHETERIZATION     CATARACT EXTRACTION W/PHACO Right 07/14/2017   Procedure: CATARACT EXTRACTION PHACO AND INTRAOCULAR LENS PLACEMENT (IOC);  Surgeon: Galen Manila, MD;  Location: [facility] ORS;  Service: Ophthalmology;  Laterality: Right;  [place] 00:31 AP% 15.7 CDE 4.97 Fluid pack lot # [id number]   CATARACT EXTRACTION W/PHACO Left 08/04/2017   Procedure: CATARACT EXTRACTION PHACO AND INTRAOCULAR LENS PLACEMENT (IOC);  Surgeon: Galen Manila, MD;  Location: [facility] ORS;  Service: Ophthalmology;  Laterality: Left;  [place] 00:34 AP% 18.7 CDE 6.42 Fluid pack lot # [id number]   COLONOSCOPY  2002   Normal   HAND SURGERY  2005   Left hand thrombosis   NASAL HEMORRHAGE CONTROL  2007   RIGHT HEART CATHETERIZATION N/A 03/16/2015   Procedure: RIGHT HEART CATH;  Surgeon: Laurey Morale, MD;  Location: [facility] CATH LAB;  Service: Cardiovascular;  Laterality: N/A;   SHOULDER SURGERY  2008   Right shoulder bone spur   THYROGLOSSAL DUCT CYST     Social History   Tobacco Use   Smoking status: Former    Current packs/day: 0.00    Average packs/day: 1 pack/day for 17.0 years (17.0 ttl pk-yrs)    Types: Cigarettes    Start date: 12/08/1961    Quit date: 12/08/1978    Years since quitting: 44.6   Smokeless tobacco: Never  Vaping Use   Vaping status: Never Used  Substance Use Topics   Alcohol use: No    Alcohol/week: 0.0 standard drinks of alcohol   Drug use: No   Family History  Problem Relation Age of Onset   Heart attack Mother 70   Emphysema Mother    Allergies Mother    Asthma Mother    Allergies Father    Glaucoma Father    Arrhythmia Sister        Atrial fibrillation   Cancer Sister        breast   Breast cancer Sister    Emphysema Sister    Heart disease Sister    Rheumatologic disease Sister    Lung cancer Sister    Diabetes Other         Parent   Hyperlipidemia Other        Other relative   Hypertension Other        Parent, other relative   Breast cancer Other        Other relative, sister   Allergies Other        Siblings   Asthma Other  Sisters   Colon cancer Neg Hx    Esophageal cancer Neg Hx    Rectal cancer Neg Hx    Stomach cancer Neg Hx    Allergies  Allergen Reactions   Adhesive [Tape] Rash and Other (See Comments)    "Burns" the skin   Amoxicillin-Pot Clavulanate Other (See Comments)    Resulted in C-DIFF!!!!   Paxlovid [Nirmatrelvir-Ritonavir] Anaphylaxis    Throat swelling    Macrobid [Nitrofurantoin] Diarrhea    Severe abdominal cramping   Simvastatin Other (See Comments)    Caused severe muscle pain   Current Outpatient Medications on File Prior to Visit  Medication Sig Dispense Refill   benzonatate (TESSALON) 200 MG capsule Take 1 capsule (200 mg total) by mouth 3 (three) times daily as needed. 30 capsule 1   chlorpheniramine (CHLOR-TRIMETON) 4 MG tablet Take 8 mg by mouth 2 (two) times daily.     famotidine (PEPCID) 20 MG tablet Take 1 tablet (20 mg total) by mouth 2 (two) times daily. 180 tablet 3   NONFORMULARY OR COMPOUNDED ITEM compound testosterone cream     OVER THE COUNTER MEDICATION Take 5-7 mLs by mouth See admin instructions. Life Tones (uric acid cleanser- has celery extract and other herbs) liquid: Take 5-7 ml's by mouth once a day     progesterone (PROMETRIUM) 100 MG capsule Take 100 mg by mouth at bedtime.     rosuvastatin (CRESTOR) 5 MG tablet TAKE 1 TABLET BY MOUTH TWICE A WEEK ON MONDAY/THURSDAY 24 tablet 3   sertraline (ZOLOFT) 50 MG tablet Take 1 tablet (50 mg total) by mouth daily. 90 tablet 3   thyroid (ARMOUR THYROID) 15 MG tablet Take 15 mg by mouth daily.     traZODone (DESYREL) 100 MG tablet TAKE 1 TABLET BY MOUTH EVERYDAY AT BEDTIME 90 tablet 3   No current facility-administered medications on file prior to visit.    Review of Systems  Constitutional:   Negative for activity change, appetite change, fatigue, fever and unexpected weight change.  HENT:  Positive for postnasal drip, rhinorrhea and voice change. Negative for congestion, ear pain, sinus pressure and sore throat.        Throat clearning   Eyes:  Negative for pain, redness and visual disturbance.  Respiratory:  Positive for cough. Negative for chest tightness, shortness of breath, wheezing and stridor.   Cardiovascular:  Negative for chest pain and palpitations.  Gastrointestinal:  Negative for abdominal distention, abdominal pain, blood in stool, constipation, diarrhea and nausea.       Heartburn  Endocrine: Negative for polydipsia and polyuria.  Genitourinary:  Negative for dysuria, frequency and urgency.  Musculoskeletal:  Negative for arthralgias, back pain and myalgias.  Skin:  Negative for pallor and rash.  Allergic/Immunologic: Negative for environmental allergies.  Neurological:  Negative for dizziness, syncope and headaches.  Hematological:  Negative for adenopathy. Does not bruise/bleed easily.  Psychiatric/Behavioral:  Negative for decreased concentration and dysphoric mood. The patient is not nervous/anxious.        Objective:   Physical Exam Constitutional:      General: She is not in acute distress.    Appearance: Normal appearance. She is well-developed and normal weight. She is not ill-appearing or diaphoretic.  HENT:     Head: Normocephalic and atraumatic.     Right Ear: Tympanic membrane and ear canal normal.     Left Ear: Ear canal normal.     Nose: Nose normal.     Comments: Boggy nares  Mouth/Throat:     Mouth: Mucous membranes are moist.     Pharynx: Oropharynx is clear. No oropharyngeal exudate or posterior oropharyngeal erythema.  Eyes:     General: No scleral icterus.       Right eye: No discharge.        Left eye: No discharge.     Conjunctiva/sclera: Conjunctivae normal.     Pupils: Pupils are equal, round, and reactive to light.  Neck:      Thyroid: No thyromegaly.     Vascular: No carotid bruit or JVD.  Cardiovascular:     Rate and Rhythm: Regular rhythm. Tachycardia present.     Heart sounds: Normal heart sounds.     No gallop.  Pulmonary:     Effort: Pulmonary effort is normal. No respiratory distress.     Breath sounds: Normal breath sounds. No stridor. No wheezing, rhonchi or rales.  Chest:     Chest wall: No tenderness.  Abdominal:     General: There is no distension or abdominal bruit.     Palpations: Abdomen is soft. There is no mass.     Tenderness: There is no abdominal tenderness. There is no guarding or rebound.  Musculoskeletal:     Cervical back: Normal range of motion and neck supple. No tenderness.     Right lower leg: No edema.     Left lower leg: No edema.  Lymphadenopathy:     Cervical: No cervical adenopathy.  Skin:    General: Skin is warm and dry.     Coloration: Skin is not pale.     Findings: No bruising, lesion or rash.  Neurological:     Mental Status: She is alert.     Cranial Nerves: No cranial nerve deficit.     Motor: No weakness.     Coordination: Coordination normal.     Deep Tendon Reflexes: Reflexes are normal and symmetric. Reflexes normal.  Psychiatric:        Mood and Affect: Mood normal.           Assessment & Plan:   Problem List Items Addressed This Visit       Cardiovascular and Mediastinum   RESOLVED: Pulmonary HTN (HCC)    Suspected false positive echo  Neg right heart cath Reviewed cardiology notes and studies        Respiratory   Laryngopharyngeal reflux (LPR)    Suspect casue of cough/ throat symptoms  Ref to ENT Jenne Pane in past)  Increase ppi bo bid (protonix) Continue pepcid  Watch diet  Prop head of bed       Relevant Orders   Ambulatory referral to ENT   Pharyngeal dysphagia    May be reflex related Will get more aggressive with treatment  Ref to ENT       Relevant Orders   Ambulatory referral to ENT     Other   Chronic cough  - Primary    Suspect this may be fro LPR in setting of heartburn and throat clearing  Has had this before  Will increase protonix to 40 mg bid Continue pepcid 20 mg in evening (can dose bid if needed) Watch diet  Cxr today -clear  Ref to ENT for this in addition to throat symptoms (Dr Jenne Pane in past)      Relevant Orders   DG Chest 2 View (Completed)   Ambulatory referral to ENT

## 2023-07-29 NOTE — Assessment & Plan Note (Signed)
Suspect casue of cough/ throat symptoms  Ref to ENT Jenne Pane in past)  Increase ppi bo bid (protonix) Continue pepcid  Watch diet  Prop head of bed

## 2023-07-29 NOTE — Patient Instructions (Addendum)
Chest xray now  We will contact you with result   Go up on protonix to twice daily  Continue pepcid also  Watch your diet for trigger and don't eat before lying down   I put the referral in for ENT  Please let us know if you don't hear in 1-2 weeks   For cough you can try delsym over the counter

## 2023-07-29 NOTE — Assessment & Plan Note (Signed)
Suspected false positive echo  Neg right heart cath Reviewed cardiology notes and studies

## 2023-07-29 NOTE — Assessment & Plan Note (Addendum)
Suspect this may be fro LPR in setting of heartburn and throat clearing  Has had this before  Will increase protonix to 40 mg bid Continue pepcid 20 mg in evening (can dose bid if needed) Watch diet  Cxr today -clear  Ref to ENT for this in addition to throat symptoms (Dr Jenne Pane in past)

## 2023-07-30 NOTE — Addendum Note (Signed)
Addended by: Roxy Manns A on: 07/30/2023 08:46 AM   Modules accepted: Orders

## 2023-08-11 ENCOUNTER — Ambulatory Visit: Payer: HMO

## 2023-08-20 ENCOUNTER — Ambulatory Visit
Admission: RE | Admit: 2023-08-20 | Discharge: 2023-08-20 | Disposition: A | Discharge status: Home / Self Care | Payer: HMO | Source: Ambulatory Visit | Attending: Family Medicine | Admitting: Family Medicine

## 2023-08-20 DIAGNOSIS — Z1231 Encounter for screening mammogram for malignant neoplasm of breast: Secondary | ICD-10-CM

## 2023-08-24 DIAGNOSIS — H26492 Other secondary cataract, left eye: Secondary | ICD-10-CM | POA: Diagnosis not present

## 2023-09-28 ENCOUNTER — Ambulatory Visit: Payer: HMO | Admitting: Family

## 2023-09-28 ENCOUNTER — Encounter: Payer: Self-pay | Admitting: Family

## 2023-09-28 VITALS — BP 126/70 | HR 68 | Temp 97.8°F | Ht 63.75 in | Wt 159.4 lb

## 2023-09-28 DIAGNOSIS — R399 Unspecified symptoms and signs involving the genitourinary system: Secondary | ICD-10-CM | POA: Diagnosis not present

## 2023-09-28 DIAGNOSIS — N390 Urinary tract infection, site not specified: Secondary | ICD-10-CM

## 2023-09-28 DIAGNOSIS — Z23 Encounter for immunization: Secondary | ICD-10-CM | POA: Diagnosis not present

## 2023-09-28 LAB — POC URINALSYSI DIPSTICK (AUTOMATED)
Bilirubin, UA: NEGATIVE
Blood, UA: NEGATIVE
Glucose, UA: NEGATIVE
Ketones, UA: NEGATIVE
Nitrite, UA: NEGATIVE
Protein, UA: NEGATIVE
Spec Grav, UA: <=1.005 — AB (ref 1.010–1.025)
Urobilinogen, UA: 0.2 U/dL
pH, UA: 6.5 (ref 5.0–8.0)

## 2023-09-28 MED ORDER — CIPROFLOXACIN HCL 500 MG PO TABS
500.0000 mg | ORAL_TABLET | Freq: Two times a day (BID) | ORAL | 0 refills | Status: AC
Start: 2023-09-28 — End: 2023-10-01

## 2023-09-28 NOTE — Assessment & Plan Note (Addendum)
poct urine dip in office Urine culture ordered pending results antbx sent to pharmacy, pt to take as directed. Encouraged increased water intake throughout the day. Choosing to treat due to being symptomatic. If no improvement in the next 2 days pt advised to let me know.  Rx cipro 500 mg po bid x 3 days.  Cipro chosen due to allergy list and pt states bactrim' doesn't work for her'.

## 2023-09-28 NOTE — Progress Notes (Signed)
Established Patient Office Visit  Subjective:   Patient ID: Mary Gordon, female    DOB: [birth year]  Age: 77 y.o. MRN: [id number]  CC:  Chief Complaint  Patient presents with   Urinary Tract Infection    HPI: Mary Gordon is a 77 y.o. female presenting on 09/28/2023 for Urinary Tract Infection  Here with c/o one week h/o of dysuria, frequency and urgency.  Is on day four of azo, has not yet taken today with some very mild improvement. Drinking increased amount of water as well.   No flank pain, no chills, no fever.  No abdominal pain.  No vaginal discharge or vaginal itching.        ROS: Negative unless specifically indicated above in HPI.   Relevant past medical history reviewed and updated as indicated.   Allergies and medications reviewed and updated.   Current Outpatient Medications:    ciprofloxacin (CIPRO) 500 MG tablet, Take 1 tablet (500 mg total) by mouth 2 (two) times daily for 3 days., Disp: 6 tablet, Rfl: 0   chlorpheniramine (CHLOR-TRIMETON) 4 MG tablet, Take 8 mg by mouth 2 (two) times daily., Disp: , Rfl:    famotidine (PEPCID) 20 MG tablet, Take 1 tablet (20 mg total) by mouth 2 (two) times daily., Disp: 180 tablet, Rfl: 3   NONFORMULARY OR COMPOUNDED ITEM, compound testosterone cream, Disp: , Rfl:    OVER THE COUNTER MEDICATION, Take 5-7 mLs by mouth See admin instructions. Life Tones (uric acid cleanser- has celery extract and other herbs) liquid: Take 5-7 ml's by mouth once a day, Disp: , Rfl:    pantoprazole (PROTONIX) 40 MG tablet, Take 1 tablet (40 mg total) by mouth 2 (two) times daily., Disp: 180 tablet, Rfl: 1   progesterone (PROMETRIUM) 100 MG capsule, Take 100 mg by mouth at bedtime., Disp: , Rfl:    rosuvastatin (CRESTOR) 5 MG tablet, TAKE 1 TABLET BY MOUTH TWICE A WEEK ON MONDAY/THURSDAY, Disp: 24 tablet, Rfl: 3   sertraline (ZOLOFT) 50 MG tablet, Take 1 tablet (50 mg total) by mouth daily., Disp: 90 tablet, Rfl: 3   thyroid  (ARMOUR THYROID) 15 MG tablet, Take 15 mg by mouth daily., Disp: , Rfl:    traZODone (DESYREL) 100 MG tablet, TAKE 1 TABLET BY MOUTH EVERYDAY AT BEDTIME, Disp: 90 tablet, Rfl: 3  Allergies  Allergen Reactions   Adhesive [Tape] Rash and Other (See Comments)    "Burns" the skin   Amoxicillin-Pot Clavulanate Other (See Comments)    Resulted in C-DIFF!!!!   Paxlovid [Nirmatrelvir-Ritonavir] Anaphylaxis    Throat swelling    Macrobid [Nitrofurantoin] Diarrhea    Severe abdominal cramping   Simvastatin Other (See Comments)    Caused severe muscle pain    Objective:   BP 126/70 (BP Location: Left Arm, Patient Position: Sitting, Cuff Size: Normal)   Pulse 68   Temp 97.8 F (36.6 C) (Oral)   Ht 5' 3.75" (1.619 m)   Wt 159 lb 6.4 oz (72.3 kg)   SpO2 99%   BMI 27.58 kg/m    Physical Exam Constitutional:      General: She is not in acute distress.    Appearance: Normal appearance. She is normal weight. She is not ill-appearing, toxic-appearing or diaphoretic.  Cardiovascular:     Rate and Rhythm: Normal rate.  Pulmonary:     Effort: Pulmonary effort is normal.  Abdominal:     General: Abdomen is flat.     Tenderness: There is no abdominal  tenderness. There is no right CVA tenderness or left CVA tenderness.  Neurological:     General: No focal deficit present.     Mental Status: She is alert and oriented to person, place, and time. Mental status is at baseline.     Motor: No weakness.  Psychiatric:        Mood and Affect: Mood normal.        Behavior: Behavior normal.        Thought Content: Thought content normal.        Judgment: Judgment normal.     Assessment & Plan:  Urinary tract infection symptoms -     POCT Urinalysis Dipstick (Automated)  Urinary tract infection without complication Assessment & Plan: poct urine dip in office Urine culture ordered pending results antbx sent to pharmacy, pt to take as directed. Encouraged increased water intake throughout the  day. Choosing to treat due to being symptomatic. If no improvement in the next 2 days pt advised to let me know.  Rx cipro 500 mg po bid x 3 days.  Cipro chosen due to allergy list and pt states bactrim' doesn't work for her'.    Orders: -     Ciprofloxacin HCl; Take 1 tablet (500 mg total) by mouth 2 (two) times daily for 3 days.  Dispense: 6 tablet; Refill: 0     Follow up plan: Return if symptoms worsen or fail to improve, for f/u PCP if no improvement in symptoms.  Mort Sawyers, FNP

## 2023-09-29 LAB — URINE CULTURE
MICRO NUMBER:: 15620594
SPECIMEN QUALITY:: ADEQUATE

## 2023-10-06 NOTE — Progress Notes (Unsigned)
Mary Schoon T. Alaster Asfaw, MD, CAQ Sports Medicine [facility] [address] [place], [address]  Phone: [phone]  FAX: [phone]  Mary Gordon - 77 y.o. female  MRN [id number]  Date of Birth: [birth year]  Date: 10/08/2023  PCP: Judy Pimple, MD  Referral: Judy Pimple, MD  No chief complaint on file.  Subjective:   Mary Gordon is a 77 y.o. very pleasant female patient with There is no height or weight on file to calculate BMI. who presents with the following:  The patient presents with ongoing problems with her fingers.  I saw her previously about 10 months ago with a trigger finger on the right fourth digit.    Review of Systems is noted in the HPI, as appropriate  Objective:   There were no vitals taken for this visit.  GEN: No acute distress; alert,appropriate. PULM: Breathing comfortably in no respiratory distress PSYCH: Normally interactive.   Laboratory and Imaging Data:  Assessment and Plan:   ***

## 2023-10-08 ENCOUNTER — Ambulatory Visit (INDEPENDENT_AMBULATORY_CARE_PROVIDER_SITE_OTHER): Payer: HMO | Admitting: Family Medicine

## 2023-10-08 ENCOUNTER — Encounter: Payer: Self-pay | Admitting: Family Medicine

## 2023-10-08 VITALS — BP 110/64 | HR 80 | Temp 97.4°F | Ht 63.75 in | Wt 160.1 lb

## 2023-10-08 DIAGNOSIS — M720 Palmar fascial fibromatosis [Dupuytren]: Secondary | ICD-10-CM | POA: Diagnosis not present

## 2023-10-08 DIAGNOSIS — M79644 Pain in right finger(s): Secondary | ICD-10-CM | POA: Diagnosis not present

## 2023-10-09 ENCOUNTER — Encounter: Payer: Self-pay | Admitting: *Deleted

## 2023-10-28 ENCOUNTER — Ambulatory Visit: Payer: HMO

## 2023-10-28 VITALS — Ht 63.75 in | Wt 155.0 lb

## 2023-10-28 DIAGNOSIS — Z0000 Encounter for general adult medical examination without abnormal findings: Secondary | ICD-10-CM

## 2023-10-28 DIAGNOSIS — J0191 Acute recurrent sinusitis, unspecified: Secondary | ICD-10-CM | POA: Diagnosis not present

## 2023-10-28 DIAGNOSIS — K219 Gastro-esophageal reflux disease without esophagitis: Secondary | ICD-10-CM | POA: Diagnosis not present

## 2023-10-28 NOTE — Progress Notes (Signed)
Subjective:   Mary Gordon is a 77 y.o. female who presents for Medicare Annual (Subsequent) preventive examination.  Visit Complete: Virtual I connected with  Adin Hector on 10/28/23 by a audio enabled telemedicine application and verified that I am speaking with the correct person using two identifiers.  Patient Location: Home  Provider Location: Office/Clinic  I discussed the limitations of evaluation and management by telemedicine. The patient expressed understanding and agreed to proceed.  Vital Signs: Because this visit was a virtual/telehealth visit, some criteria may be missing or patient reported. Any vitals not documented were not able to be obtained and vitals that have been documented are patient reported.  Patient Medicare AWV questionnaire was completed by the patient on (not done); I have confirmed that all information answered by patient is correct and no changes since this date. Cardiac Risk Factors include: advanced age (>50men, >81 women);dyslipidemia    Objective:    Today's Vitals   10/28/23 1048 10/28/23 1049  Weight: 155 lb (70.3 kg)   Height: 5' 3.75" (1.619 m)   PainSc:  3    Body mass index is 26.81 kg/m.     10/28/2023   11:04 AM 10/24/2022    2:12 PM 06/25/2021    2:43 PM 08/20/2018   10:50 AM 08/19/2017   10:24 AM 07/14/2017   10:40 AM 05/09/2017    6:19 PM  Advanced Directives  Does Patient Have a Medical Advance Directive? Yes Yes Yes Yes Yes Yes No  Type of Estate agent of [place];Living will Healthcare Power of [place];Living will  Healthcare Power of [place];Living will Healthcare Power of [place];Living will Healthcare Power of Attorney   Copy of Healthcare Power of Attorney in Chart? No - copy requested No - copy requested  No - copy requested No - copy requested      Current Medications (verified) Outpatient Encounter Medications as of 10/28/2023  Medication Sig   chlorpheniramine (CHLOR-TRIMETON) 4  MG tablet Take 8 mg by mouth 2 (two) times daily.   famotidine (PEPCID) 20 MG tablet Take 1 tablet (20 mg total) by mouth 2 (two) times daily.   NONFORMULARY OR COMPOUNDED ITEM compound testosterone cream   OVER THE COUNTER MEDICATION Take 5-7 mLs by mouth See admin instructions. Life Tones (uric acid cleanser- has celery extract and other herbs) liquid: Take 5-7 ml's by mouth once a day   pantoprazole (PROTONIX) 40 MG tablet Take 1 tablet (40 mg total) by mouth 2 (two) times daily.   progesterone (PROMETRIUM) 100 MG capsule Take 100 mg by mouth at bedtime.   rosuvastatin (CRESTOR) 5 MG tablet TAKE 1 TABLET BY MOUTH TWICE A WEEK ON MONDAY/THURSDAY   sertraline (ZOLOFT) 50 MG tablet Take 1 tablet (50 mg total) by mouth daily.   thyroid (ARMOUR THYROID) 15 MG tablet Take 15 mg by mouth daily.   traZODone (DESYREL) 100 MG tablet TAKE 1 TABLET BY MOUTH EVERYDAY AT BEDTIME   No facility-administered encounter medications on file as of 10/28/2023.    Allergies (verified) Adhesive [tape], Amoxicillin-pot clavulanate, Paxlovid [nirmatrelvir-ritonavir], Macrobid [nitrofurantoin], and Simvastatin   History: Past Medical History:  Diagnosis Date   Allergy    all year   Anxiety    Bursitis of hip    Cataract    Cervical cancer (HCC) 1978   Chronic kidney disease    RENAL INSUFF   Depression    Dysrhythmia    PVC'S   Fibromyalgia    GERD (gastroesophageal reflux disease)  High cholesterol    History of Clostridium difficile infection    In past from augmentin   History of hiatal hernia    Osteopenia    Palpitations    Panic disorder    Pulmonary HTN (HCC) 10/20/2014   moderate with PASP   Pulmonary nodule    Being observed   Vitamin D deficiency    Past Surgical History:  Procedure Laterality Date   ABDOMINAL HYSTERECTOMY  1978   CARDIAC CATHETERIZATION     CATARACT EXTRACTION W/PHACO Right 07/14/2017   Procedure: CATARACT EXTRACTION PHACO AND INTRAOCULAR LENS PLACEMENT  (IOC);  Surgeon: Galen Manila, MD;  Location: [facility] ORS;  Service: Ophthalmology;  Laterality: Right;  [place] 00:31 AP% 15.7 CDE 4.97 Fluid pack lot # [id number]   CATARACT EXTRACTION W/PHACO Left 08/04/2017   Procedure: CATARACT EXTRACTION PHACO AND INTRAOCULAR LENS PLACEMENT (IOC);  Surgeon: Galen Manila, MD;  Location: [facility] ORS;  Service: Ophthalmology;  Laterality: Left;  [place] 00:34 AP% 18.7 CDE 6.42 Fluid pack lot # [id number]   COLONOSCOPY  2002   Normal   HAND SURGERY  2005   Left hand thrombosis   NASAL HEMORRHAGE CONTROL  2007   RIGHT HEART CATHETERIZATION N/A 03/16/2015   Procedure: RIGHT HEART CATH;  Surgeon: Laurey Morale, MD;  Location: [facility] CATH LAB;  Service: Cardiovascular;  Laterality: N/A;   SHOULDER SURGERY  2008   Right shoulder bone spur   THYROGLOSSAL DUCT CYST     Family History  Problem Relation Age of Onset   Heart attack Mother 4   Emphysema Mother    Allergies Mother    Asthma Mother    Allergies Father    Glaucoma Father    Arrhythmia Sister        Atrial fibrillation   Cancer Sister        breast   Breast cancer Sister    Emphysema Sister    Heart disease Sister    Rheumatologic disease Sister    Lung cancer Sister    Diabetes Other        Parent   Hyperlipidemia Other        Other relative   Hypertension Other        Parent, other relative   Breast cancer Other        Other relative, sister   Allergies Other        Siblings   Asthma Other        Sisters   Colon cancer Neg Hx    Esophageal cancer Neg Hx    Rectal cancer Neg Hx    Stomach cancer Neg Hx    Social History   Socioeconomic History   Marital status: Married    Spouse name: Not on file   Number of children: Not on file   Years of education: Not on file   Highest education level: Not on file  Occupational History   Occupation: Retired from book keeping  Tobacco Use   Smoking status: Former    Current packs/day: 0.00    Average packs/day: 1 pack/day for 17.0 years  (17.0 ttl pk-yrs)    Types: Cigarettes    Start date: 12/08/1961    Quit date: 12/08/1978    Years since quitting: 44.9   Smokeless tobacco: Never  Vaping Use   Vaping status: Never Used  Substance and Sexual Activity   Alcohol use: No    Alcohol/week: 0.0 standard drinks of alcohol   Drug use: No   Sexual  activity: Yes    Partners: Male  Other Topics Concern   Not on file  Social History Narrative   Married (husband is colon cancer survivor)   Does not get regular exercise   G4P1   Social Determinants of Health   Financial Resource Strain: Low Risk  (10/28/2023)   Overall Financial Resource Strain (CARDIA)    Difficulty of Paying Living Expenses: Not hard at all  Food Insecurity: No Food Insecurity (10/28/2023)   Hunger Vital Sign    Worried About Running Out of Food in the Last Year: Never true    Ran Out of Food in the Last Year: Never true  Transportation Needs: No Transportation Needs (10/28/2023)   PRAPARE - Administrator, Civil Service (Medical): No    Lack of Transportation (Non-Medical): No  Physical Activity: Insufficiently Active (10/28/2023)   Exercise Vital Sign    Days of Exercise per Week: 4 days    Minutes of Exercise per Session: 30 min  Stress: No Stress Concern Present (10/28/2023)   [employer] of Occupational Health - Occupational Stress Questionnaire    Feeling of Stress : Only a little  Social Connections: Socially Integrated (10/28/2023)   Social Connection and Isolation Panel [NHANES]    Frequency of Communication with Friends and Family: More than three times a week    Frequency of Social Gatherings with Friends and Family: More than three times a week    Attends Religious Services: More than 4 times per year    Active Member of [employer] or Organizations: Yes    Attends Engineer, structural: More than 4 times per year    Marital Status: Married    Tobacco Counseling Counseling given: Not Answered  Clinical  Intake:  Pre-visit preparation completed: No  Pain : 0-10 Pain Score: 3  Pain Type: Chronic pain Pain Location: Hand Pain Orientation: Right Pain Descriptors / Indicators: Aching Pain Onset: More than a month ago Pain Frequency: Intermittent Pain Relieving Factors: using less  Pain Relieving Factors: using less  BMI - recorded: 26.81 Nutritional Status: BMI 25 -29 Overweight Nutritional Risks: None Diabetes: No  How often do you need to have someone help you when you read instructions, pamphlets, or other written materials from your doctor or pharmacy?: 1 - Never  Interpreter Needed?: No  Comments: lives with husband Information entered by :: B.Reagen Haberman,LPN   Activities of Daily Living    10/28/2023   11:05 AM  In your present state of health, do you have any difficulty performing the following activities:  Hearing? 0  Vision? 0  Difficulty concentrating or making decisions? 0  Walking or climbing stairs? 0  Dressing or bathing? 0  Doing errands, shopping? 0  Preparing Food and eating ? N  Using the Toilet? N  In the past six months, have you accidently leaked urine? Y  Do you have problems with loss of bowel control? N  Managing your Medications? N  Managing your Finances? N  Housekeeping or managing your Housekeeping? N    Patient Care Team: [name], Audrie Gallus, MD as PCP - General Galen Manila, MD as Referring Physician (Ophthalmology) Laurey Morale, MD as Consulting Physician (Cardiology) Allie Bossier, MD as Consulting Physician (Obstetrics and Gynecology) Christia Reading, MD as Consulting Physician (Otolaryngology)  Indicate any recent Medical Services you may have received from other than [facility] providers in the past year (date may be approximate).     Assessment:   This is a routine wellness  examination for Mary Gordon.  Hearing/Vision screen Hearing Screening - Comments:: Pt says her hearing is good Vision Screening - Comments:: Pt says her vision  is good with glasses  Eye exam last Sept'24 Dr Druscilla Brownie   Goals Addressed             This Visit's Progress    COMPLETED: Increase physical activity   On track    Starting 08/20/2018, I will continue to exercise for at least 120 min 2 days per week.      Patient Stated       Pt says she has no current goals       Depression Screen    10/28/2023   11:00 AM 04/27/2023   12:42 PM 10/24/2022    2:10 PM 10/09/2021    8:26 AM 09/07/2020    2:23 PM 09/02/2019    3:28 PM 08/20/2018   10:49 AM  PHQ 2/9 Scores  PHQ - 2 Score 1 0 0 0 0 0 0  PHQ- 9 Score  2     0    Fall Risk    10/28/2023   10:56 AM 04/27/2023   12:42 PM 12/19/2022    9:44 AM 10/24/2022    2:11 PM 10/09/2021    8:26 AM  Fall Risk   Falls in the past year? 0 1 0 0 1  Number falls in past yr: 0 0 0 0 0  Injury with Fall? 0 0 0 0 0  Risk for fall due to : No Fall Risks History of fall(s) No Fall Risks No Fall Risks   Follow up Education provided;Falls prevention discussed Falls evaluation completed Falls evaluation completed Falls prevention discussed Falls evaluation completed    MEDICARE RISK AT HOME: Medicare Risk at Home Any stairs in or around the home?: Yes If so, are there any without handrails?: Yes Home free of loose throw rugs in walkways, pet beds, electrical cords, etc?: Yes Adequate lighting in your home to reduce risk of falls?: Yes Life alert?: No Use of a cane, walker or w/c?: No Grab bars in the bathroom?: Yes Shower chair or bench in shower?: No Elevated toilet seat or a handicapped toilet?: No TIMED UP AND GO:  Was the test performed?  No    Cognitive Function:    08/20/2018   10:50 AM 08/19/2017   10:20 AM 08/18/2016    3:15 PM  MMSE - Mini Mental State Exam  Orientation to time 5 5 5   Orientation to Place 5 5 5   Registration 3 3 3   Attention/ Calculation 0 0 0  Recall 3 3 3   Language- name 2 objects 0 0 0  Language- repeat 1 1 1   Language- follow 3 step command 3 3 3    Language- read & follow direction 0 0 0  Write a sentence 0 0 0  Copy design 0 0 0  Total score 20 20 20         10/28/2023   11:06 AM 10/24/2022    2:12 PM  6CIT Screen  What Year? 0 points 0 points  What month? 0 points 0 points  What time? 0 points 0 points  Count back from 20 0 points 0 points  Months in reverse 0 points 0 points  Repeat phrase 0 points 0 points  Total Score 0 points 0 points    Immunizations Immunization History  Administered Date(s) Administered   Fluad Quad(high Dose 65+) 09/02/2019, 09/07/2020   Fluad Trivalent(High Dose 65+) 09/28/2023  Influenza Split 09/26/2011, 10/15/2012   Influenza Whole 09/04/2010   Influenza, High Dose Seasonal PF 08/30/2021   Influenza,inj,Quad PF,6+ Mos 09/06/2013, 09/08/2014, 09/17/2015, 08/18/2016, 08/19/2017, 11/18/2018   PFIZER Comirnaty(Gray Top)Covid-19 Tri-Sucrose Vaccine 03/22/2021   PFIZER(Purple Top)SARS-COV-2 Vaccination 01/30/2020, 02/20/2020, 09/16/2020   Pfizer(Comirnaty)Fall Seasonal Vaccine 12 years and older 09/28/2023   Pneumococcal Conjugate-13 08/25/2016   Pneumococcal Polysaccharide-23 10/07/2008, 08/19/2017   Td 08/08/2009   Tdap 09/20/2020   Zoster Recombinant(Shingrix) 01/20/2022   Zoster, Live 12/08/2008    TDAP status: Up to date  Flu Vaccine status: Up to date  Pneumococcal vaccine status: Up to date  Covid-19 vaccine status: Completed vaccines  Qualifies for Shingles Vaccine? Yes   Zostavax completed Yes   Shingrix Completed?: Yes  Screening Tests Health Maintenance  Topic Date Due   COVID-19 Vaccine (6 - 2023-24 season) 01/29/2024   MAMMOGRAM  08/19/2024   Medicare Annual Wellness (AWV)  10/27/2024   DTaP/Tdap/Td (3 - Td or Tdap) 09/20/2030   Pneumonia Vaccine 67+ Years old  Completed   INFLUENZA VACCINE  Completed   DEXA SCAN  Completed   Hepatitis C Screening  Completed   HPV VACCINES  Aged Out   Colonoscopy  Discontinued   Zoster Vaccines- Shingrix  Discontinued     Health Maintenance  There are no preventive care reminders to display for this patient.  Colorectal cancer screening: No longer required.   Mammogram status: No longer required due to age.  Bone Density status: Completed 04/23/2021. Results reflect: Bone density results: OSTEOPENIA. Repeat every 3-5 years.  Lung Cancer Screening: (Low Dose CT Chest recommended if Age 73-80 years, 20 pack-year currently smoking OR have quit w/in 15years.) does not qualify.   Lung Cancer Screening Referral: no  Additional Screening:  Hepatitis C Screening: does not qualify; Completed 05/21/2023  Vision Screening: Recommended annual ophthalmology exams for early detection of glaucoma and other disorders of the eye. Is the patient up to date with their annual eye exam?  Yes  Who is the provider or what is the name of the office in which the patient attends annual eye exams? Dr Druscilla Brownie If pt is not established with a provider, would they like to be referred to a provider to establish care? No .   Dental Screening: Recommended annual dental exams for proper oral hygiene  Diabetic Foot Exam: n/a  Community Resource Referral / Chronic Care Management: CRR required this visit?  No   CCM required this visit?  No   Plan:     I have personally reviewed and noted the following in the patient's chart:   Medical and social history Use of alcohol, tobacco or illicit drugs  Current medications and supplements including opioid prescriptions. Patient is not currently taking opioid prescriptions. Functional ability and status Nutritional status Physical activity Advanced directives List of other physicians Hospitalizations, surgeries, and ER visits in previous 12 months Vitals Screenings to include cognitive, depression, and falls Referrals and appointments  In addition, I have reviewed and discussed with patient certain preventive protocols, quality metrics, and best practice recommendations. A  written personalized care plan for preventive services as well as general preventive health recommendations were provided to patient.    Sue Lush, LPN   [id number]/09/[id number]   After Visit Summary: (MyChart) Due to this being a telephonic visit, the after visit summary with patients personalized plan was offered to patient via MyChart   Nurse Notes: The patient states she is doing well and has no concerns or questions at this  time.

## 2023-10-28 NOTE — Patient Instructions (Signed)
Mary Gordon , Thank you for taking time to come for your Medicare Wellness Visit. I appreciate your ongoing commitment to your health goals. Please review the following plan we discussed and let me know if I can assist you in the future.   Referrals/Orders/Follow-Ups/Clinician Recommendations: none  This is a list of the screening recommended for you and due dates:  Health Maintenance  Topic Date Due   COVID-19 Vaccine (6 - 2023-24 season) 01/29/2024   Mammogram  08/19/2024   Medicare Annual Wellness Visit  10/27/2024   DTaP/Tdap/Td vaccine (3 - Td or Tdap) 09/20/2030   Pneumonia Vaccine  Completed   Flu Shot  Completed   DEXA scan (bone density measurement)  Completed   Hepatitis C Screening  Completed   HPV Vaccine  Aged Out   Colon Cancer Screening  Discontinued   Zoster (Shingles) Vaccine  Discontinued    Advanced directives: (Copy Requested) Please bring a copy of your health care power of attorney and living will to the office to be added to your chart at your convenience.  Next Medicare Annual Wellness Visit scheduled for next year: Yes 10/28/2024 @ 10:10am telephone

## 2023-11-02 DIAGNOSIS — M720 Palmar fascial fibromatosis [Dupuytren]: Secondary | ICD-10-CM | POA: Diagnosis not present

## 2023-11-03 DIAGNOSIS — L538 Other specified erythematous conditions: Secondary | ICD-10-CM | POA: Diagnosis not present

## 2023-11-03 DIAGNOSIS — D225 Melanocytic nevi of trunk: Secondary | ICD-10-CM | POA: Diagnosis not present

## 2023-11-03 DIAGNOSIS — L821 Other seborrheic keratosis: Secondary | ICD-10-CM | POA: Diagnosis not present

## 2023-11-03 DIAGNOSIS — Z08 Encounter for follow-up examination after completed treatment for malignant neoplasm: Secondary | ICD-10-CM | POA: Diagnosis not present

## 2023-11-03 DIAGNOSIS — Z85828 Personal history of other malignant neoplasm of skin: Secondary | ICD-10-CM | POA: Diagnosis not present

## 2023-11-03 DIAGNOSIS — L820 Inflamed seborrheic keratosis: Secondary | ICD-10-CM | POA: Diagnosis not present

## 2023-11-03 DIAGNOSIS — L814 Other melanin hyperpigmentation: Secondary | ICD-10-CM | POA: Diagnosis not present

## 2023-11-03 DIAGNOSIS — L570 Actinic keratosis: Secondary | ICD-10-CM | POA: Diagnosis not present

## 2023-11-03 DIAGNOSIS — L720 Epidermal cyst: Secondary | ICD-10-CM | POA: Diagnosis not present

## 2023-11-11 DIAGNOSIS — L080 Pyoderma: Secondary | ICD-10-CM | POA: Diagnosis not present

## 2023-11-18 ENCOUNTER — Encounter: Payer: Self-pay | Admitting: General Practice

## 2023-11-18 ENCOUNTER — Ambulatory Visit: Payer: HMO | Admitting: General Practice

## 2023-11-18 VITALS — BP 122/80 | HR 87 | Temp 97.6°F | Ht 63.75 in | Wt 161.0 lb

## 2023-11-18 DIAGNOSIS — T7840XA Allergy, unspecified, initial encounter: Secondary | ICD-10-CM

## 2023-11-18 MED ORDER — PREDNISONE 20 MG PO TABS
40.0000 mg | ORAL_TABLET | Freq: Every day | ORAL | 0 refills | Status: AC
Start: 2023-11-18 — End: 2023-11-23

## 2023-11-18 NOTE — Patient Instructions (Signed)
Start prednisone 20 mg tablets. Take 2 tablets by mouth once daily in the morning for 5 days.   You can use benedryl cream and ice pack to get some relief  as well.   Please follow up with PCP if your symptoms do not improve or worsen.   It was a pleasure meeting you!

## 2023-11-18 NOTE — Progress Notes (Signed)
Established Patient Office Visit  Subjective   Patient ID: Mary Gordon, female    DOB: August 16, 1946  Age: 77 y.o. MRN: [id number]  Chief Complaint  Patient presents with   Rash    All over body; worse chest and right leg. Patient states she changed her downy beads to a new one they came out with and broke out. Patient is very itchy and applying 1% hydrocortisone cream.     Rash Pertinent negatives include no shortness of breath or vomiting.   [name]. [name] is a 77 year old female, patient of Dr. Roxy Manns, who presents today for an acute visit to discuss a rash.   She started to develop a rash on her left forearm about a week ago which she attributed to her dog scratching. Then she realize that it was on other parts of her body. No angioedema, no chest pain, shortness of breath, wheezing. She started using the downy beads and this is the only new change she made recently and confirms that it was caused by it. She believes that the spot on her chest is getting worse but other areas on her body are improving. She has tried 1% hydrocortisone but does not feel that it gave her any relief. She is currently taking Famotidine 20 mg twice daily for GERD and chlorpheniramine maleate twice a day.   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   Rosacea 12/27/2014   PVCs (premature ventricular contractions) 09/26/2014   Fatigue 05/31/2012   Fibromyalgia 05/31/2012   GERD (gastroesophageal reflux disease) 07/08/2011   Brachial neuritis or radiculitis 09/04/2010   PULMONARY NODULE  09/14/2009   PALPITATIONS 08/17/2009   Vitamin D deficiency 02/22/2009   HYPERCHOLESTEROLEMIA 02/22/2009   Adjustment disorder with mixed anxiety and depressed mood 02/22/2009   Allergic rhinitis 02/22/2009   MENOPAUSAL SYNDROME 02/22/2009   Osteopenia 02/22/2009   Past Medical History:  Diagnosis Date   Allergy    all year   Anxiety    Bursitis of hip    Cataract    Cervical cancer (HCC) 1978   Chronic kidney disease    RENAL INSUFF   Depression    Dysrhythmia    PVC'S   Fibromyalgia    GERD (gastroesophageal reflux disease)    High cholesterol    History of Clostridium difficile infection    In past from augmentin   History of hiatal hernia    Osteopenia    Palpitations    Panic disorder    Pulmonary HTN (HCC) 10/20/2014   moderate with PASP   Pulmonary nodule    Being observed   Vitamin D deficiency    Past Surgical History:  Procedure Laterality Date   ABDOMINAL HYSTERECTOMY  1978   CARDIAC CATHETERIZATION     CATARACT EXTRACTION W/PHACO Right 07/14/2017   Procedure: CATARACT EXTRACTION PHACO AND INTRAOCULAR LENS PLACEMENT (IOC);  Surgeon: Galen Manila, MD;  Location: [facility] ORS;  Service: Ophthalmology;  Laterality: Right;  [place] 00:31 AP% 15.7 CDE 4.97 Fluid pack lot # [id number]   CATARACT EXTRACTION W/PHACO Left 08/04/2017  Procedure: CATARACT EXTRACTION PHACO AND INTRAOCULAR LENS PLACEMENT (IOC);  Surgeon: Galen Manila, MD;  Location: [facility] ORS;  Service: Ophthalmology;  Laterality: Left;  [place] 00:34 AP% 18.7 CDE 6.42 Fluid pack lot # [id number]   COLONOSCOPY  2002   Normal   HAND SURGERY  2005   Left hand thrombosis   NASAL HEMORRHAGE CONTROL  2007   RIGHT HEART CATHETERIZATION N/A 03/16/2015   Procedure: RIGHT HEART CATH;  Surgeon: Laurey Morale, MD;  Location: [facility];  Service: Cardiovascular;  Laterality: N/A;   SHOULDER SURGERY  2008   Right shoulder bone spur   THYROGLOSSAL DUCT CYST     Allergies  Allergen Reactions   Adhesive  [Tape] Rash and Other (See Comments)    "Burns" the skin   Amoxicillin-Pot Clavulanate Other (See Comments)    Resulted in C-DIFF!!!!   Paxlovid [Nirmatrelvir-Ritonavir] Anaphylaxis    Throat swelling    Macrobid [Nitrofurantoin] Diarrhea    Severe abdominal cramping   Simvastatin Other (See Comments)    Caused severe muscle pain         10/28/2023   11:00 AM 04/27/2023   12:42 PM 10/24/2022    2:10 PM  Depression screen PHQ 2/9  Decreased Interest 0 0 0  Down, Depressed, Hopeless 1 0 0  PHQ - 2 Score 1 0 0  Altered sleeping  1   Tired, decreased energy  1   Change in appetite  0   Feeling bad or failure about yourself   0   Trouble concentrating  0   Moving slowly or fidgety/restless  0   Suicidal thoughts  0   PHQ-9 Score  2   Difficult doing work/chores  Not difficult at all        04/27/2023   12:42 PM  GAD 7 : Generalized Anxiety Score  Nervous, Anxious, on Edge 0  Control/stop worrying 0  Worry too much - different things 0  Trouble relaxing 0  Restless 0  Easily annoyed or irritable 0  Afraid - awful might happen 0  Total GAD 7 Score 0      Review of Systems  Eyes:  Negative for blurred vision.  Respiratory:  Negative for shortness of breath and wheezing.   Cardiovascular:  Negative for chest pain.  Gastrointestinal:  Negative for nausea and vomiting.  Skin:  Positive for itching and rash.  Neurological:  Positive for headaches. Negative for dizziness.      Objective:     BP 122/80 (BP Location: Left Arm, Patient Position: Sitting, Cuff Size: Normal)   Pulse 87   Temp 97.6 F (36.4 C) (Oral)   Ht 5' 3.75" (1.619 m)   Wt 161 lb (73 kg)   SpO2 97%   BMI 27.85 kg/m  BP Readings from Last 3 Encounters:  11/18/23 122/80  10/08/23 110/64  09/28/23 126/70   Wt Readings from Last 3 Encounters:  11/18/23 161 lb (73 kg)  10/28/23 155 lb (70.3 kg)  10/08/23 160 lb 2 oz (72.6 kg)      Physical Exam Vitals and nursing note reviewed.   Constitutional:      Appearance: Normal appearance.  Cardiovascular:     Rate and Rhythm: Normal rate and regular rhythm.     Pulses: Normal pulses.     Heart sounds: Normal heart sounds.  Pulmonary:     Effort: Pulmonary effort is normal.     Breath sounds: Normal breath sounds.  Skin:    General: Skin is  warm.     Findings: Erythema present.     Comments: On left side under the shoulder, lower right leg and other smaller spots all over her body. It is red in color and hot to touch.   Neurological:     Mental Status: She is alert and oriented to person, place, and time.  Psychiatric:        Mood and Affect: Mood normal.        Behavior: Behavior normal.        Thought Content: Thought content normal.        Judgment: Judgment normal.      No results found for any visits on 11/18/23.     The 10-year ASCVD risk score (Arnett DK, et al., 2019) is: 18.2%    Assessment & Plan:  Allergic reaction, initial encounter Assessment & Plan: Stable.  No red flags on exam today.  Start prednisone 20 mg tablets. Take 2 tablets by mouth once daily in the morning for 5 days to help with inflammation.   Recommended using ice pack and benedryl cream to get relief.   Continue pepcid, chlor-trimeton and pantoprazole as prescribed.   She will update if her symptoms fail to improve or worsen.   Orders: -     predniSONE; Take 2 tablets (40 mg total) by mouth daily for 5 days.  Dispense: 10 tablet; Refill: 0     Return if symptoms worsen or fail to improve.    Modesto Charon, NP

## 2023-11-18 NOTE — Assessment & Plan Note (Signed)
Stable.  No red flags on exam today.  Start prednisone 20 mg tablets. Take 2 tablets by mouth once daily in the morning for 5 days to help with inflammation.   Recommended using ice pack and benedryl cream to get relief.   Continue pepcid, chlor-trimeton and pantoprazole as prescribed.   She will update if her symptoms fail to improve or worsen.

## 2023-12-09 ENCOUNTER — Other Ambulatory Visit: Payer: Self-pay | Admitting: Family Medicine

## 2023-12-24 ENCOUNTER — Other Ambulatory Visit: Payer: Self-pay | Admitting: Family Medicine

## 2023-12-24 NOTE — Telephone Encounter (Signed)
lvm for pt to call office to schedule

## 2023-12-24 NOTE — Telephone Encounter (Signed)
Pt is due for her CPE (labs prior), please schedule and then route back to me

## 2024-01-07 ENCOUNTER — Telehealth: Payer: Self-pay | Admitting: Family Medicine

## 2024-01-07 NOTE — Telephone Encounter (Signed)
-----   Message from Lovena Neighbours sent at 01/06/2024  3:11 PM EST ----- Regarding: Labs for Friday 1.31.25 Please put lab orders in future. Thank you, Denny Peon

## 2024-01-08 ENCOUNTER — Ambulatory Visit (INDEPENDENT_AMBULATORY_CARE_PROVIDER_SITE_OTHER): Payer: HMO | Admitting: Family Medicine

## 2024-01-08 ENCOUNTER — Other Ambulatory Visit (INDEPENDENT_AMBULATORY_CARE_PROVIDER_SITE_OTHER): Payer: HMO

## 2024-01-08 ENCOUNTER — Encounter: Payer: Self-pay | Admitting: Family Medicine

## 2024-01-08 ENCOUNTER — Telehealth (INDEPENDENT_AMBULATORY_CARE_PROVIDER_SITE_OTHER): Payer: HMO | Admitting: Family Medicine

## 2024-01-08 ENCOUNTER — Other Ambulatory Visit: Payer: HMO

## 2024-01-08 VITALS — BP 104/58 | HR 84 | Temp 98.1°F | Ht 63.75 in | Wt 165.5 lb

## 2024-01-08 DIAGNOSIS — E7800 Pure hypercholesterolemia, unspecified: Secondary | ICD-10-CM | POA: Diagnosis not present

## 2024-01-08 DIAGNOSIS — R5383 Other fatigue: Secondary | ICD-10-CM

## 2024-01-08 DIAGNOSIS — M85851 Other specified disorders of bone density and structure, right thigh: Secondary | ICD-10-CM

## 2024-01-08 DIAGNOSIS — F4323 Adjustment disorder with mixed anxiety and depressed mood: Secondary | ICD-10-CM | POA: Diagnosis not present

## 2024-01-08 DIAGNOSIS — R053 Chronic cough: Secondary | ICD-10-CM

## 2024-01-08 DIAGNOSIS — R7989 Other specified abnormal findings of blood chemistry: Secondary | ICD-10-CM

## 2024-01-08 DIAGNOSIS — Z0000 Encounter for general adult medical examination without abnormal findings: Secondary | ICD-10-CM

## 2024-01-08 DIAGNOSIS — R7303 Prediabetes: Secondary | ICD-10-CM

## 2024-01-08 DIAGNOSIS — I272 Pulmonary hypertension, unspecified: Secondary | ICD-10-CM

## 2024-01-08 DIAGNOSIS — E2839 Other primary ovarian failure: Secondary | ICD-10-CM | POA: Diagnosis not present

## 2024-01-08 DIAGNOSIS — K219 Gastro-esophageal reflux disease without esophagitis: Secondary | ICD-10-CM

## 2024-01-08 DIAGNOSIS — E559 Vitamin D deficiency, unspecified: Secondary | ICD-10-CM

## 2024-01-08 LAB — CBC WITH DIFFERENTIAL/PLATELET
Basophils Absolute: 0 10*3/uL (ref 0.0–0.1)
Basophils Relative: 0.7 % (ref 0.0–3.0)
Eosinophils Absolute: 0.2 10*3/uL (ref 0.0–0.7)
Eosinophils Relative: 3.7 % (ref 0.0–5.0)
HCT: 39.5 % (ref 36.0–46.0)
Hemoglobin: 12.8 g/dL (ref 12.0–15.0)
Lymphocytes Relative: 27.9 % (ref 12.0–46.0)
Lymphs Abs: 1.6 10*3/uL (ref 0.7–4.0)
MCHC: 32.3 g/dL (ref 30.0–36.0)
MCV: 86.7 fL (ref 78.0–100.0)
Monocytes Absolute: 0.4 10*3/uL (ref 0.1–1.0)
Monocytes Relative: 6.4 % (ref 3.0–12.0)
Neutro Abs: 3.6 10*3/uL (ref 1.4–7.7)
Neutrophils Relative %: 61.3 % (ref 43.0–77.0)
Platelets: 284 10*3/uL (ref 150.0–400.0)
RBC: 4.55 Mil/uL (ref 3.87–5.11)
RDW: 13.6 % (ref 11.5–15.5)
WBC: 5.8 10*3/uL (ref 4.0–10.5)

## 2024-01-08 LAB — COMPREHENSIVE METABOLIC PANEL
ALT: 13 U/L (ref 0–35)
AST: 17 U/L (ref 0–37)
Albumin: 4 g/dL (ref 3.5–5.2)
Alkaline Phosphatase: 106 U/L (ref 39–117)
BUN: 8 mg/dL (ref 6–23)
CO2: 30 meq/L (ref 19–32)
Calcium: 9 mg/dL (ref 8.4–10.5)
Chloride: 102 meq/L (ref 96–112)
Creatinine, Ser: 0.9 mg/dL (ref 0.40–1.20)
GFR: 61.75 mL/min (ref >60.00)
Glucose, Bld: 94 mg/dL (ref 70–99)
Potassium: 4.4 meq/L (ref 3.5–5.1)
Sodium: 139 meq/L (ref 135–145)
Total Bilirubin: 0.9 mg/dL (ref 0.2–1.2)
Total Protein: 6.1 g/dL (ref 6.0–8.3)

## 2024-01-08 LAB — LIPID PANEL
Cholesterol: 207 mg/dL — ABNORMAL HIGH (ref 0–200)
HDL: 56.6 mg/dL (ref >39.00)
LDL Cholesterol: 122 mg/dL — ABNORMAL HIGH (ref 0–99)
NonHDL: 150.46
Total CHOL/HDL Ratio: 4
Triglycerides: 144 mg/dL (ref 0.0–149.0)
VLDL: 28.8 mg/dL (ref 0.0–40.0)

## 2024-01-08 LAB — TSH: TSH: 2.14 u[IU]/mL (ref 0.35–5.50)

## 2024-01-08 LAB — VITAMIN D 25 HYDROXY (VIT D DEFICIENCY, FRACTURES): VITD: 49.24 ng/mL (ref 30.00–100.00)

## 2024-01-08 LAB — VITAMIN B12: Vitamin B-12: 129 pg/mL — ABNORMAL LOW (ref 211–911)

## 2024-01-08 LAB — HEMOGLOBIN A1C: Hgb A1c MFr Bld: 5.6 % (ref 4.6–6.5)

## 2024-01-08 MED ORDER — ROSUVASTATIN CALCIUM 5 MG PO TABS: ORAL_TABLET | ORAL | 3 refills | Status: AC

## 2024-01-08 MED ORDER — PANTOPRAZOLE SODIUM 40 MG PO TBEC: 40.0000 mg | DELAYED_RELEASE_TABLET | Freq: Two times a day (BID) | ORAL | 3 refills | Status: AC

## 2024-01-08 MED ORDER — SERTRALINE HCL 50 MG PO TABS: 50.0000 mg | ORAL_TABLET | Freq: Every day | ORAL | 3 refills | Status: DC

## 2024-01-08 MED ORDER — FAMOTIDINE 20 MG PO TABS: 20.0000 mg | ORAL_TABLET | Freq: Two times a day (BID) | ORAL | 3 refills | Status: AC

## 2024-01-08 NOTE — Telephone Encounter (Signed)
 Lab orders

## 2024-01-08 NOTE — Assessment & Plan Note (Signed)
Lab today  Disc goals for lipids and reasons to control them Rev last labs with pt Rev low sat fat diet in detail Pt can tolerate crestor 5 mg twice weekly at this time

## 2024-01-08 NOTE — Progress Notes (Signed)
Subjective:    Patient ID: Mary Gordon, female    DOB: [birth year], 78 y.o.   MRN: [id number]  HPI  Here for health maintenance exam and to review chronic medical problems   Wt Readings from Last 3 Encounters:  01/08/24 165 lb 8 oz (75.1 kg)  11/18/23 161 lb (73 kg)  10/28/23 155 lb (70.3 kg)   28.63 kg/m  Vitals:   01/08/24 1020  BP: (!) 104/58  Pulse: 84  Temp: 98.1 F (36.7 C)  SpO2: 97%    Immunization History  Administered Date(s) Administered   Fluad Quad(high Dose 65+) 09/02/2019, 09/07/2020   Fluad Trivalent(High Dose 65+) 09/28/2023   Influenza Split 09/26/2011, 10/15/2012   Influenza Whole 09/04/2010   Influenza, High Dose Seasonal PF 08/30/2021   Influenza,inj,Quad PF,6+ Mos 09/06/2013, 09/08/2014, 09/17/2015, 08/18/2016, 08/19/2017, 11/18/2018   PFIZER Comirnaty(Gray Top)Covid-19 Tri-Sucrose Vaccine 03/22/2021   PFIZER(Purple Top)SARS-COV-2 Vaccination 01/30/2020, 02/20/2020, 09/16/2020   Pfizer(Comirnaty)Fall Seasonal Vaccine 12 years and older 09/28/2023   Pneumococcal Conjugate-13 08/25/2016   Pneumococcal Polysaccharide-23 10/07/2008, 08/19/2017   Td 08/08/2009   Tdap 09/20/2020   Zoster Recombinant(Shingrix) 01/20/2022   Zoster, Live 12/08/2008    There are no preventive care reminders to display for this patient.  Feeling good   Mammogram 08/2023 Self breast exam no lumps   Gyn health- no problems  Weaning off progresterone and testosterone cream    Colon cancer screening  Colonoscopy 10/2011-normal  Is over 75  Does not desire further screening    Bone health  Dexa 04/2021  Has one scheduled for march Osteopenia  Falls-none  Fractures-none  Supplements - vit D sporatically  Last vitamin D Lab Results  Component Value Date   VD25OH 64.66 12/11/2022   Exercise :  Not done much lately /having trouble with her hand  No time to play pickleball recently but getting back into it now     Derm care -up to date/ went in  nov  Nothing new     Mood    10/28/2023   11:00 AM 04/27/2023   12:42 PM 10/24/2022    2:10 PM 10/09/2021    8:26 AM 09/07/2020    2:23 PM  Depression screen PHQ 2/9  Decreased Interest 0 0 0 0 0  Down, Depressed, Hopeless 1 0 0 0 0  PHQ - 2 Score 1 0 0 0 0  Altered sleeping  1     Tired, decreased energy  1     Change in appetite  0     Feeling bad or failure about yourself   0     Trouble concentrating  0     Moving slowly or fidgety/restless  0     Suicidal thoughts  0     PHQ-9 Score  2     Difficult doing work/chores  Not difficult at all     Continues sertraline 50 mg daily   GERD with LPR and chronic cough Protonix 40 mg bid Pepcid 20 mg bid  Went to see ENT-did not do anything / no help  Asked about sinus trouble and was treated with infection and it did not help   Wants to see another ENT for 2nd osteoporosis Clears throat all the time  Hoarse voice    Some chronic fatigue as well as fibromyalgia  She is trying to wean off meds for fibro and thyroid   Lab Results  Component Value Date   TSH 1.88 12/11/2022   Lab Results  Component  Value Date   WBC 6.0 12/12/2022   HGB 12.4 12/12/2022   HCT 37.9 12/12/2022   MCV 84.4 12/12/2022   PLT 278 12/12/2022   Lab Results  Component Value Date   NA 139 12/11/2022   K 4.4 12/11/2022   CO2 28 12/11/2022   GLUCOSE 97 12/11/2022   BUN 9 12/11/2022   CREATININE 0.80 12/11/2022   CALCIUM 9.5 12/11/2022   GFR 71.67 12/11/2022   GFRNONAA >60 12/08/2018   Lab Results  Component Value Date   ALT 17 12/11/2022   AST 20 12/11/2022   ALKPHOS 108 12/11/2022   BILITOT 0.7 12/11/2022    Had labs this am -pending  B12 def Lab Results  Component Value Date   VITAMINB12 255 12/11/2022   Labs from this am pend  Hyperlipidemia Lab Results  Component Value Date   CHOL 168 12/11/2022   HDL 58.80 12/11/2022   LDLCALC 77 12/11/2022   LDLDIRECT 161.5 10/24/2013   TRIG 158.0 (H) 12/11/2022   CHOLHDL 3  12/11/2022   Crestor 5 mg twice weekly (all she can tolerate)  Labs this am     Prediabetes Lab Results  Component Value Date   HGBA1C 5.5 12/11/2022   HGBA1C 5.6 10/09/2021   HGBA1C 5.7 08/27/2020   Pending today's  labs     Patient Active Problem List   Diagnosis Date Noted   Allergic reaction 11/18/2023   Chronic cough 07/29/2023   Constipation 04/27/2023   Hair loss 08/05/2022   Brittle nails 08/05/2022   Low vitamin B12 level 08/05/2022   Change in voice 06/08/2022   Estrogen deficiency 09/07/2020   Laryngopharyngeal reflux (LPR) 01/08/2017   Pharyngeal dysphagia 01/08/2017   Prediabetes 08/25/2016   Routine general medical examination at a health care facility 08/19/2016   Stress reaction 09/17/2015   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   Rosacea 12/27/2014   PVCs (premature ventricular contractions) 09/26/2014   Fatigue 05/31/2012   Fibromyalgia 05/31/2012   GERD (gastroesophageal reflux disease) 07/08/2011   Brachial neuritis or radiculitis 09/04/2010   PULMONARY NODULE 09/14/2009   PALPITATIONS 08/17/2009   Vitamin D deficiency 02/22/2009   HYPERCHOLESTEROLEMIA 02/22/2009   Adjustment disorder with mixed anxiety and depressed mood 02/22/2009   Allergic rhinitis 02/22/2009   MENOPAUSAL SYNDROME 02/22/2009   Osteopenia 02/22/2009   Past Medical History:  Diagnosis Date   Allergy    all year   Anxiety    Bursitis of hip    Cataract    Cervical cancer (HCC) 1978   Chronic kidney disease    RENAL INSUFF   Depression    Dysrhythmia    PVC'S   Fibromyalgia    GERD (gastroesophageal reflux disease)    High cholesterol    History of Clostridium difficile infection    In past from augmentin   History of hiatal hernia    Osteopenia    Palpitations    Panic disorder    Pulmonary HTN (HCC) 10/20/2014   moderate with PASP   Pulmonary nodule    Being observed   Vitamin D deficiency    Past Surgical History:  Procedure Laterality Date    ABDOMINAL HYSTERECTOMY  1978   CARDIAC CATHETERIZATION     CATARACT EXTRACTION W/PHACO Right 07/14/2017   Procedure: CATARACT EXTRACTION PHACO AND INTRAOCULAR LENS PLACEMENT (IOC);  Surgeon: Galen Manila, MD;  Location: [facility] ORS;  Service: Ophthalmology;  Laterality: Right;  [place] 00:31 AP% 15.7 CDE 4.97 Fluid pack lot # [id number]   CATARACT EXTRACTION W/PHACO  Left 08/04/2017   Procedure: CATARACT EXTRACTION PHACO AND INTRAOCULAR LENS PLACEMENT (IOC);  Surgeon: Galen Manila, MD;  Location: [facility] ORS;  Service: Ophthalmology;  Laterality: Left;  [place] 00:34 AP% 18.7 CDE 6.42 Fluid pack lot # [id number]   COLONOSCOPY  2002   Normal   HAND SURGERY  2005   Left hand thrombosis   NASAL HEMORRHAGE CONTROL  2007   RIGHT HEART CATHETERIZATION N/A 03/16/2015   Procedure: RIGHT HEART CATH;  Surgeon: Laurey Morale, MD;  Location: [facility] CATH LAB;  Service: Cardiovascular;  Laterality: N/A;   SHOULDER SURGERY  2008   Right shoulder bone spur   THYROGLOSSAL DUCT CYST     Social History   Tobacco Use   Smoking status: Former    Current packs/day: 0.00    Average packs/day: 1 pack/day for 17.0 years (17.0 ttl pk-yrs)    Types: Cigarettes    Start date: 12/08/1961    Quit date: 12/08/1978    Years since quitting: 45.1   Smokeless tobacco: Never  Vaping Use   Vaping status: Never Used  Substance Use Topics   Alcohol use: No    Alcohol/week: 0.0 standard drinks of alcohol   Drug use: No   Family History  Problem Relation Age of Onset   Heart attack Mother 62   Emphysema Mother    Allergies Mother    Asthma Mother    Allergies Father    Glaucoma Father    Arrhythmia Sister        Atrial fibrillation   Cancer Sister        breast   Breast cancer Sister    Emphysema Sister    Heart disease Sister    Rheumatologic disease Sister    Lung cancer Sister    Diabetes Other        Parent   Hyperlipidemia Other        Other relative   Hypertension Other        Parent, other relative   Breast  cancer Other        Other relative, sister   Allergies Other        Siblings   Asthma Other        Sisters   Colon cancer Neg Hx    Esophageal cancer Neg Hx    Rectal cancer Neg Hx    Stomach cancer Neg Hx    Allergies  Allergen Reactions   Adhesive [Tape] Rash and Other (See Comments)    "Burns" the skin   Amoxicillin-Pot Clavulanate Other (See Comments)    Resulted in C-DIFF!!!!   Paxlovid [Nirmatrelvir-Ritonavir] Anaphylaxis    Throat swelling    Macrobid [Nitrofurantoin] Diarrhea    Severe abdominal cramping   Simvastatin Other (See Comments)    Caused severe muscle pain   Current Outpatient Medications on File Prior to Visit  Medication Sig Dispense Refill   chlorpheniramine (CHLOR-TRIMETON) 4 MG tablet Take 8 mg by mouth 2 (two) times daily.     NONFORMULARY OR COMPOUNDED ITEM compound testosterone cream     OVER THE COUNTER MEDICATION Take 5-7 mLs by mouth See admin instructions. Life Tones (uric acid cleanser- has celery extract and other herbs) liquid: Take 5-7 ml's by mouth once a day     traZODone (DESYREL) 100 MG tablet TAKE 1 TABLET BY MOUTH EVERYDAY AT BEDTIME 90 tablet 3   No current facility-administered medications on file prior to visit.    Review of Systems  Constitutional:  Positive for fatigue.  Negative for activity change, appetite change, fever and unexpected weight change.  HENT:  Positive for trouble swallowing and voice change. Negative for congestion, ear pain, rhinorrhea, sinus pressure and sore throat.   Eyes:  Negative for pain, redness and visual disturbance.  Respiratory:  Positive for cough. Negative for shortness of breath and wheezing.   Cardiovascular:  Negative for chest pain and palpitations.  Gastrointestinal:  Negative for abdominal pain, blood in stool, constipation and diarrhea.       Reflux   Endocrine: Negative for polydipsia and polyuria.  Genitourinary:  Negative for dysuria, frequency and urgency.  Musculoskeletal:  Positive  for arthralgias and myalgias. Negative for back pain.  Skin:  Negative for pallor and rash.  Allergic/Immunologic: Negative for environmental allergies.  Neurological:  Negative for dizziness, syncope and headaches.  Hematological:  Negative for adenopathy. Does not bruise/bleed easily.  Psychiatric/Behavioral:  Negative for decreased concentration and dysphoric mood. The patient is not nervous/anxious.        Objective:   Physical Exam Constitutional:      General: She is not in acute distress.    Appearance: Normal appearance. She is well-developed and normal weight. She is not ill-appearing or diaphoretic.  HENT:     Head: Normocephalic and atraumatic.     Right Ear: Tympanic membrane, ear canal and external ear normal.     Left Ear: Tympanic membrane, ear canal and external ear normal.     Nose: Nose normal. No congestion.     Mouth/Throat:     Mouth: Mucous membranes are moist.     Pharynx: Oropharynx is clear. No posterior oropharyngeal erythema.  Eyes:     General: No scleral icterus.    Extraocular Movements: Extraocular movements intact.     Conjunctiva/sclera: Conjunctivae normal.     Pupils: Pupils are equal, round, and reactive to light.  Neck:     Thyroid: No thyromegaly.     Vascular: No carotid bruit or JVD.  Cardiovascular:     Rate and Rhythm: Normal rate and regular rhythm.     Pulses: Normal pulses.     Heart sounds: Normal heart sounds.     No gallop.  Pulmonary:     Effort: Pulmonary effort is normal. No respiratory distress.     Breath sounds: Normal breath sounds. No wheezing.     Comments: Good air exch Chest:     Chest wall: No tenderness.  Abdominal:     General: Bowel sounds are normal. There is no distension or abdominal bruit.     Palpations: Abdomen is soft. There is no mass.     Tenderness: There is no abdominal tenderness.     Hernia: No hernia is present.  Genitourinary:    Comments: Breast exam: No mass, nodules, thickening, tenderness,  bulging, retraction, inflamation, nipple discharge or skin changes noted.  No axillary or clavicular LA.     Musculoskeletal:        General: No tenderness. Normal range of motion.     Cervical back: Normal range of motion and neck supple. No rigidity. No muscular tenderness.     Right lower leg: No edema.     Left lower leg: No edema.     Comments: No kyphosis   Lymphadenopathy:     Cervical: No cervical adenopathy.  Skin:    General: Skin is warm and dry.     Coloration: Skin is not pale.     Findings: No erythema or rash.     Comments:  Solar lentigines diffusely   Neurological:     Mental Status: She is alert. Mental status is at baseline.     Cranial Nerves: No cranial nerve deficit.     Motor: No abnormal muscle tone.     Coordination: Coordination normal.     Gait: Gait normal.     Deep Tendon Reflexes: Reflexes are normal and symmetric. Reflexes normal.  Psychiatric:        Mood and Affect: Mood normal.        Cognition and Memory: Cognition and memory normal.           Assessment & Plan:   Problem List Items Addressed This Visit       Respiratory   Laryngopharyngeal reflux (LPR)   See a/p for GERD Ref for ENT 2nd op        Digestive   GERD (gastroesophageal reflux disease)   Ongoing Throat clearing  LP reflux  On bid ppi and pepcid -max therapy  Still symptomatic  Ref to ENT to see if cough and throat symptoms are from this       Relevant Medications   famotidine (PEPCID) 20 MG tablet   pantoprazole (PROTONIX) 40 MG tablet     Musculoskeletal and Integument   Osteopenia   Dexa ordered for march  No falls or fracture  D level pending Discussed fall prevention, supplements and exercise for bone density          Other   Vitamin D deficiency   D level today  Important for bone and overall health  Stressed better compliance with this       Routine general medical examination at a health care facility - Primary   Reviewed health habits  including diet and exercise and skin cancer prevention Reviewed appropriate screening tests for age  Also reviewed health mt list, fam hx and immunization status , as well as social and family history   See HPI Labs reviewed and ordered Health Maintenance  Topic Date Due   Mammogram  08/19/2024   Medicare Annual Wellness Visit  10/27/2024   DTaP/Tdap/Td vaccine (3 - Td or Tdap) 09/20/2030   Pneumonia Vaccine  Completed   Flu Shot  Completed   DEXA scan (bone density measurement)  Completed   COVID-19 Vaccine  Completed   Hepatitis C Screening  Completed   HPV Vaccine  Aged Out   Colon Cancer Screening  Discontinued   Zoster (Shingles) Vaccine  Discontinued   Declines colon cancer screening after age 3 Dexa ordered for march Discussed fall prevention, supplements and exercise for bone density  Derm care utd  PHQ 2 with treatment       Prediabetes   A1c today  disc imp of low glycemic diet and wt loss to prevent DM2       Low vitamin B12 level   B12 today  On max ppi Oral supplementation  Balanced diet       HYPERCHOLESTEROLEMIA   Lab today  Disc goals for lipids and reasons to control them Rev last labs with pt Rev low sat fat diet in detail Pt can tolerate crestor 5 mg twice weekly at this time       Relevant Medications   rosuvastatin (CRESTOR) 5 MG tablet   Fatigue   Ongoing in setting of fibromyalgia  Labs pending  Reassuring exam        Estrogen deficiency   Dexa ordered for march  Chronic cough   Ongoing / with  throat clearing and GERD Pt is interested in ENT 2nd opinion  No improvement with levaquin/sinusitis treatment  Referral done      Adjustment disorder with mixed anxiety and depressed mood   PHQ 2 Fairly well controlled  Continues sertraline 50 mg daily Trazodone for sleep

## 2024-01-08 NOTE — Patient Instructions (Addendum)
Make sure to keep up with vitamin D  Put it in your pill box   Stay active! Add some strength training to your routine, this is important for bone and brain health and can reduce your risk of falls and help your body use insulin properly and regulate weight  Light weights, exercise bands , and internet videos are a good way to start  Yoga (chair or regular), machines , floor exercises or a gym with machines are also good options     Take care of yourself    Let's see how labs look

## 2024-01-08 NOTE — Assessment & Plan Note (Signed)
Ongoing / with  throat clearing and GERD Pt is interested in ENT 2nd opinion  No improvement with levaquin/sinusitis treatment  Referral done

## 2024-01-08 NOTE — Assessment & Plan Note (Addendum)
Dexa ordered for march  No falls or fracture  D level pending Discussed fall prevention, supplements and exercise for bone density

## 2024-01-08 NOTE — Assessment & Plan Note (Signed)
Ongoing Throat clearing  LP reflux  On bid ppi and pepcid -max therapy  Still symptomatic  Ref to ENT to see if cough and throat symptoms are from this

## 2024-01-08 NOTE — Assessment & Plan Note (Signed)
Reviewed health habits including diet and exercise and skin cancer prevention Reviewed appropriate screening tests for age  Also reviewed health mt list, fam hx and immunization status , as well as social and family history   See HPI Labs reviewed and ordered Health Maintenance  Topic Date Due   Mammogram  08/19/2024   Medicare Annual Wellness Visit  10/27/2024   DTaP/Tdap/Td vaccine (3 - Td or Tdap) 09/20/2030   Pneumonia Vaccine  Completed   Flu Shot  Completed   DEXA scan (bone density measurement)  Completed   COVID-19 Vaccine  Completed   Hepatitis C Screening  Completed   HPV Vaccine  Aged Out   Colon Cancer Screening  Discontinued   Zoster (Shingles) Vaccine  Discontinued   Declines colon cancer screening after age 17 Dexa ordered for march Discussed fall prevention, supplements and exercise for bone density  Derm care utd  PHQ 2 with treatment

## 2024-01-08 NOTE — Assessment & Plan Note (Signed)
B12 today  On max ppi Oral supplementation  Balanced diet

## 2024-01-08 NOTE — Assessment & Plan Note (Signed)
See a/p for GERD Ref for ENT 2nd op

## 2024-01-08 NOTE — Assessment & Plan Note (Addendum)
Dexa ordered for march

## 2024-01-08 NOTE — Assessment & Plan Note (Signed)
Ongoing in setting of fibromyalgia  Labs pending  Reassuring exam

## 2024-01-08 NOTE — Assessment & Plan Note (Signed)
 A1c today  disc imp of low glycemic diet and wt loss to prevent DM2

## 2024-01-08 NOTE — Assessment & Plan Note (Signed)
D level today  Important for bone and overall health  Stressed better compliance with this

## 2024-01-08 NOTE — Assessment & Plan Note (Addendum)
PHQ 2 Fairly well controlled  Continues sertraline 50 mg daily Trazodone for sleep

## 2024-01-10 ENCOUNTER — Encounter: Payer: Self-pay | Admitting: Family Medicine

## 2024-01-12 NOTE — Addendum Note (Signed)
Addended by: Roxy Manns A on: 01/12/2024 08:20 PM   Modules accepted: Orders

## 2024-01-14 ENCOUNTER — Ambulatory Visit: Payer: HMO

## 2024-01-14 DIAGNOSIS — R7989 Other specified abnormal findings of blood chemistry: Secondary | ICD-10-CM | POA: Diagnosis not present

## 2024-01-14 MED ORDER — CYANOCOBALAMIN 1000 MCG/ML IJ SOLN
1000.0000 ug | Freq: Once | INTRAMUSCULAR | Status: AC
  Administered 2024-01-14: 1000 ug via INTRAMUSCULAR

## 2024-01-14 NOTE — Progress Notes (Signed)
 Per orders of Dr. Herby Lolling, injection of B-12 given by Eller Gut in left deltoid. Patient tolerated injection well.

## 2024-01-25 DIAGNOSIS — E039 Hypothyroidism, unspecified: Secondary | ICD-10-CM | POA: Diagnosis not present

## 2024-01-25 DIAGNOSIS — N951 Menopausal and female climacteric states: Secondary | ICD-10-CM | POA: Diagnosis not present

## 2024-01-28 DIAGNOSIS — E039 Hypothyroidism, unspecified: Secondary | ICD-10-CM | POA: Diagnosis not present

## 2024-01-28 DIAGNOSIS — R5383 Other fatigue: Secondary | ICD-10-CM | POA: Diagnosis not present

## 2024-01-28 DIAGNOSIS — N951 Menopausal and female climacteric states: Secondary | ICD-10-CM | POA: Diagnosis not present

## 2024-01-28 DIAGNOSIS — Z6827 Body mass index (BMI) 27.0-27.9, adult: Secondary | ICD-10-CM | POA: Diagnosis not present

## 2024-01-28 DIAGNOSIS — R6882 Decreased libido: Secondary | ICD-10-CM | POA: Diagnosis not present

## 2024-02-12 ENCOUNTER — Other Ambulatory Visit: Payer: Self-pay | Admitting: Family Medicine

## 2024-02-16 ENCOUNTER — Encounter: Payer: Self-pay | Admitting: Family Medicine

## 2024-02-16 ENCOUNTER — Ambulatory Visit
Admission: RE | Admit: 2024-02-16 | Discharge: 2024-02-16 | Disposition: A | Discharge status: Home / Self Care | Payer: HMO | Source: Ambulatory Visit | Attending: Family Medicine | Admitting: Family Medicine

## 2024-02-16 DIAGNOSIS — M8588 Other specified disorders of bone density and structure, other site: Secondary | ICD-10-CM | POA: Diagnosis not present

## 2024-02-16 DIAGNOSIS — E2839 Other primary ovarian failure: Secondary | ICD-10-CM

## 2024-02-16 DIAGNOSIS — N958 Other specified menopausal and perimenopausal disorders: Secondary | ICD-10-CM | POA: Diagnosis not present

## 2024-02-24 ENCOUNTER — Ambulatory Visit (INDEPENDENT_AMBULATORY_CARE_PROVIDER_SITE_OTHER): Admitting: Family Medicine

## 2024-02-24 ENCOUNTER — Encounter: Payer: Self-pay | Admitting: Family Medicine

## 2024-02-24 VITALS — BP 132/80 | HR 87 | Temp 98.4°F | Ht 63.75 in | Wt 170.5 lb

## 2024-02-24 DIAGNOSIS — F4323 Adjustment disorder with mixed anxiety and depressed mood: Secondary | ICD-10-CM

## 2024-02-24 DIAGNOSIS — E538 Deficiency of other specified B group vitamins: Secondary | ICD-10-CM | POA: Insufficient documentation

## 2024-02-24 MED ORDER — FLUOXETINE HCL 20 MG PO CAPS: 20.0000 mg | ORAL_CAPSULE | Freq: Every day | ORAL | 1 refills | Status: DC

## 2024-02-24 NOTE — Patient Instructions (Addendum)
 Stop the sertraline after tonight  Then start fluoxetine 20 mg tomorrow am  If any intolerable side effects or if you feel worse - call   I put the referral in for counseling  Please let us know if you don't hear in 1-2 weeks   Follow up here in 4-6 weeks    Try and take care of yourself  Getting outside is important  Exercise is important

## 2024-02-24 NOTE — Progress Notes (Signed)
 Subjective:    Patient ID: Mary Gordon, female    DOB: [birth year], 78 y.o.   MRN: [id number]  HPI  Wt Readings from Last 3 Encounters:  02/24/24 170 lb 8 oz (77.3 kg)  01/08/24 165 lb 8 oz (75.1 kg)  11/18/23 161 lb (73 kg)   29.50 kg/m  Vitals:   02/24/24 0755 02/24/24 0828  BP: (!) 144/90 132/80  Pulse: 87   Temp: 98.4 F (36.9 C)   SpO2: 96%    Pt presents to discuss depressed mood   Doing worse in the past 6 months  A lot of life changes Dealing with husband's issues is hard  (he is on the spectrum with autism) Agreed to get another dog and it is a big disruption - life is chaos and house is a mess  Husband refuses to help out with that  Was dx with skin cancer and needs radiation treatment  Some financial issues also - makes her more anxious /he over spent  Has never been happy in her marriage - not ready to leave but would be monumental   Feels down  Poor appetite and not sleeping  Not eating well  No motivation to do anything including getting outdoors or playing pickle ball  Feels generally tired  Somewhat numb  Wants to isolate     Talking with a close friend out of state who is helpful   Feels like she needs help at this point     Sertraline 50 mg daily  May feel little like a zombie with it  Trazodone for sleep 100 mg daily -helps some        02/24/2024    8:33 AM 10/28/2023   11:00 AM 04/27/2023   12:42 PM 10/24/2022    2:10 PM 10/09/2021    8:26 AM  Depression screen PHQ 2/9  Decreased Interest 3 0 0 0 0  Down, Depressed, Hopeless 3 1 0 0 0  PHQ - 2 Score 6 1 0 0 0  Altered sleeping 0  1    Tired, decreased energy 3  1    Change in appetite 3  0    Feeling bad or failure about yourself  2  0    Trouble concentrating 3  0    Moving slowly or fidgety/restless 0  0    Suicidal thoughts 0  0    PHQ-9 Score 17  2    Difficult doing work/chores Very difficult  Not difficult at all        02/24/2024    8:33 AM 04/27/2023    12:42 PM  GAD 7 : Generalized Anxiety Score  Nervous, Anxious, on Edge 3 0  Control/stop worrying 3 0  Worry too much - different things 3 0  Trouble relaxing 0 0  Restless 3 0  Easily annoyed or irritable 1 0  Afraid - awful might happen 1 0  Total GAD 7 Score 14 0  Anxiety Difficulty Very difficult     Lab Results  Component Value Date   VITAMINB12 129 (L) 01/08/2024  Taking her B12     Patient Active Problem List   Diagnosis Date Noted   Vitamin B12 deficiency 02/24/2024   Allergic reaction 11/18/2023   Chronic cough 07/29/2023   Constipation 04/27/2023   Hair loss 08/05/2022   Brittle nails 08/05/2022   Low vitamin B12 level 08/05/2022   Change in voice 06/08/2022   Estrogen deficiency 09/07/2020   Laryngopharyngeal reflux (LPR) 01/08/2017  Pharyngeal dysphagia 01/08/2017   Prediabetes 08/25/2016   Routine general medical examination at a health care facility 08/19/2016   Stress reaction 09/17/2015   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   Rosacea 12/27/2014   PVCs (premature ventricular contractions) 09/26/2014   Fatigue 05/31/2012   Fibromyalgia 05/31/2012   GERD (gastroesophageal reflux disease) 07/08/2011   Brachial neuritis or radiculitis 09/04/2010   PULMONARY NODULE 09/14/2009   PALPITATIONS 08/17/2009   Vitamin D deficiency 02/22/2009   HYPERCHOLESTEROLEMIA 02/22/2009   Adjustment disorder with mixed anxiety and depressed mood 02/22/2009   Allergic rhinitis 02/22/2009   MENOPAUSAL SYNDROME 02/22/2009   Osteopenia 02/22/2009   Past Medical History:  Diagnosis Date   Allergy    all year   Anxiety    Bursitis of hip    Cataract    Cervical cancer (HCC) 1978   Chronic kidney disease    RENAL INSUFF   Depression    Dysrhythmia    PVC'S   Fibromyalgia    GERD (gastroesophageal reflux disease)    High cholesterol    History of Clostridium difficile infection    In past from augmentin   History of hiatal hernia    Osteopenia    Palpitations     Panic disorder    Pulmonary HTN (HCC) 10/20/2014   moderate with PASP   Pulmonary nodule    Being observed   Vitamin D deficiency    Past Surgical History:  Procedure Laterality Date   ABDOMINAL HYSTERECTOMY  1978   CARDIAC CATHETERIZATION     CATARACT EXTRACTION W/PHACO Right 07/14/2017   Procedure: CATARACT EXTRACTION PHACO AND INTRAOCULAR LENS PLACEMENT (IOC);  Surgeon: Galen Manila, MD;  Location: [facility] ORS;  Service: Ophthalmology;  Laterality: Right;  [place] 00:31 AP% 15.7 CDE 4.97 Fluid pack lot # [id number]   CATARACT EXTRACTION W/PHACO Left 08/04/2017   Procedure: CATARACT EXTRACTION PHACO AND INTRAOCULAR LENS PLACEMENT (IOC);  Surgeon: Galen Manila, MD;  Location: [facility] ORS;  Service: Ophthalmology;  Laterality: Left;  [place] 00:34 AP% 18.7 CDE 6.42 Fluid pack lot # [id number]   COLONOSCOPY  2002   Normal   HAND SURGERY  2005   Left hand thrombosis   NASAL HEMORRHAGE CONTROL  2007   RIGHT HEART CATHETERIZATION N/A 03/16/2015   Procedure: RIGHT HEART CATH;  Surgeon: Laurey Morale, MD;  Location: [facility] CATH LAB;  Service: Cardiovascular;  Laterality: N/A;   SHOULDER SURGERY  2008   Right shoulder bone spur   THYROGLOSSAL DUCT CYST     Social History   Tobacco Use   Smoking status: Former    Current packs/day: 0.00    Average packs/day: 1 pack/day for 17.0 years (17.0 ttl pk-yrs)    Types: Cigarettes    Start date: 12/08/1961    Quit date: 12/08/1978    Years since quitting: 45.2   Smokeless tobacco: Never  Vaping Use   Vaping status: Never Used  Substance Use Topics   Alcohol use: No    Alcohol/week: 0.0 standard drinks of alcohol   Drug use: No   Family History  Problem Relation Age of Onset   Heart attack Mother 83   Emphysema Mother    Allergies Mother    Asthma Mother    Allergies Father    Glaucoma Father    Arrhythmia Sister        Atrial fibrillation   Cancer Sister        breast   Breast cancer Sister    Emphysema Sister  Heart disease Sister     Rheumatologic disease Sister    Lung cancer Sister    Diabetes Other        Parent   Hyperlipidemia Other        Other relative   Hypertension Other        Parent, other relative   Breast cancer Other        Other relative, sister   Allergies Other        Siblings   Asthma Other        Sisters   Colon cancer Neg Hx    Esophageal cancer Neg Hx    Rectal cancer Neg Hx    Stomach cancer Neg Hx    Allergies  Allergen Reactions   Adhesive [Tape] Rash and Other (See Comments)    "Burns" the skin   Amoxicillin-Pot Clavulanate Other (See Comments)    Resulted in C-DIFF!!!!   Paxlovid [Nirmatrelvir-Ritonavir] Anaphylaxis    Throat swelling    Macrobid [Nitrofurantoin] Diarrhea    Severe abdominal cramping   Simvastatin Other (See Comments)    Caused severe muscle pain   Current Outpatient Medications on File Prior to Visit  Medication Sig Dispense Refill   chlorpheniramine (CHLOR-TRIMETON) 4 MG tablet Take 8 mg by mouth 2 (two) times daily.     famotidine (PEPCID) 20 MG tablet Take 1 tablet (20 mg total) by mouth 2 (two) times daily. 180 tablet 3   NONFORMULARY OR COMPOUNDED ITEM compound testosterone cream     OVER THE COUNTER MEDICATION Take 5-7 mLs by mouth See admin instructions. Life Tones (uric acid cleanser- has celery extract and other herbs) liquid: Take 5-7 ml's by mouth once a day     pantoprazole (PROTONIX) 40 MG tablet Take 1 tablet (40 mg total) by mouth 2 (two) times daily. 180 tablet 3   PROGESTERONE PO Take 1 capsule by mouth daily.     rosuvastatin (CRESTOR) 5 MG tablet TAKE 1 TABLET BY MOUTH TWICE A WEEK ON MONDAY/THURSDAY 24 tablet 3   traZODone (DESYREL) 100 MG tablet TAKE 1 TABLET BY MOUTH EVERYDAY AT BEDTIME 90 tablet 3   No current facility-administered medications on file prior to visit.    Review of Systems  Constitutional:  Positive for fatigue.       Decreased motivation   Psychiatric/Behavioral:  Positive for decreased concentration, dysphoric  mood and sleep disturbance. Negative for agitation, confusion and suicidal ideas. The patient is nervous/anxious.        Objective:   Physical Exam Constitutional:      General: She is not in acute distress.    Appearance: Normal appearance. She is normal weight. She is not ill-appearing or diaphoretic.  Cardiovascular:     Rate and Rhythm: Normal rate and regular rhythm.  Pulmonary:     Effort: Pulmonary effort is normal. No respiratory distress.  Neurological:     Mental Status: She is alert.     Motor: No tremor.  Psychiatric:        Attention and Perception: Attention normal.        Mood and Affect: Mood is depressed. Affect is blunt. Affect is not tearful.        Speech: Speech normal.        Behavior: Behavior normal.        Cognition and Memory: Cognition and memory normal.     Comments: Candidly discusses symptoms and stressors             Assessment &  Plan:   Problem List Items Addressed This Visit       Other   Adjustment disorder with mixed anxiety and depressed mood - Primary   Struggling more lately in setting of issues with husband (on spectrum), financial stressors and other life events (overwhelmed taking care of new puppy)  Feels more down/vegetative and isolative with anhedonia  Takes sertraline 50 (may have felt too numb on 100 in past)   PHQ up to 17 GAD7 up to 14    Reviewed stressors/ coping techniques/symptoms/ support sources/ tx options and side effects in detail today   Discussed treatment opt  Does have a friend to talk to  Ref to [facility] for talk therapy   Will change zoloft 50 to fluoxetine 20 mg daily starting tomorrow Discussed expectations of SSRI medication including time to effectiveness and mechanism of action, also poss of side effects (early and late)- including mental fuzziness, weight or appetite change, nausea and poss of worse dep or anxiety (even suicidal thoughts)  Pt voiced understanding and will stop med and update if this  occurs    Continues trazodone 100 mg for sleep-continue if helping / aware of potential further increase in serotonin from this/ aware   Encouraged to call if concerns Encouraged self care with outdoor time and exercise when motivated enough to do so   Call back and Er precautions noted in detail today   Handout given   29 minutes were spent today both face to face and in the chart obtaining history, reviewing records(here in past)  and test results (last labs and B12) , performing exam , educating and discussing treatment options, and placing orders          Relevant Orders   Ambulatory referral to Psychology

## 2024-02-24 NOTE — Assessment & Plan Note (Addendum)
 Struggling more lately in setting of issues with husband (on spectrum), financial stressors and other life events (overwhelmed taking care of new puppy)  Feels more down/vegetative and isolative with anhedonia  Takes sertraline 50 (may have felt too numb on 100 in past)   PHQ up to 17 GAD7 up to 14    Reviewed stressors/ coping techniques/symptoms/ support sources/ tx options and side effects in detail today   Discussed treatment opt  Does have a friend to talk to  Ref to [facility] for talk therapy   Will change zoloft 50 to fluoxetine 20 mg daily starting tomorrow Discussed expectations of SSRI medication including time to effectiveness and mechanism of action, also poss of side effects (early and late)- including mental fuzziness, weight or appetite change, nausea and poss of worse dep or anxiety (even suicidal thoughts)  Pt voiced understanding and will stop med and update if this occurs    Continues trazodone 100 mg for sleep-continue if helping / aware of potential further increase in serotonin from this/ aware   Encouraged to call if concerns Encouraged self care with outdoor time and exercise when motivated enough to do so   Call back and Er precautions noted in detail today   Handout given   29 minutes were spent today both face to face and in the chart obtaining history, reviewing records(here in past)  and test results (last labs and B12) , performing exam , educating and discussing treatment options, and placing orders

## 2024-02-25 ENCOUNTER — Encounter (INDEPENDENT_AMBULATORY_CARE_PROVIDER_SITE_OTHER): Payer: Self-pay | Admitting: Otolaryngology

## 2024-02-25 ENCOUNTER — Ambulatory Visit (INDEPENDENT_AMBULATORY_CARE_PROVIDER_SITE_OTHER): Payer: HMO | Admitting: Otolaryngology

## 2024-02-25 VITALS — BP 134/75 | HR 86 | Ht 64.0 in | Wt 165.0 lb

## 2024-02-25 DIAGNOSIS — R09A2 Foreign body sensation, throat: Secondary | ICD-10-CM

## 2024-02-25 DIAGNOSIS — R0981 Nasal congestion: Secondary | ICD-10-CM

## 2024-02-25 DIAGNOSIS — R0982 Postnasal drip: Secondary | ICD-10-CM

## 2024-02-25 DIAGNOSIS — J342 Deviated nasal septum: Secondary | ICD-10-CM | POA: Diagnosis not present

## 2024-02-25 DIAGNOSIS — R059 Cough, unspecified: Secondary | ICD-10-CM

## 2024-02-25 DIAGNOSIS — K219 Gastro-esophageal reflux disease without esophagitis: Secondary | ICD-10-CM

## 2024-02-25 DIAGNOSIS — J3089 Other allergic rhinitis: Secondary | ICD-10-CM

## 2024-02-25 DIAGNOSIS — R0989 Other specified symptoms and signs involving the circulatory and respiratory systems: Secondary | ICD-10-CM

## 2024-02-25 DIAGNOSIS — J3489 Other specified disorders of nose and nasal sinuses: Secondary | ICD-10-CM

## 2024-02-25 DIAGNOSIS — J343 Hypertrophy of nasal turbinates: Secondary | ICD-10-CM

## 2024-02-25 MED ORDER — FLUTICASONE PROPIONATE 50 MCG/ACT NA SUSP: 2.0000 | Freq: Two times a day (BID) | NASAL | 6 refills | Status: DC

## 2024-02-25 MED ORDER — LEVOCETIRIZINE DIHYDROCHLORIDE 5 MG PO TABS: 5.0000 mg | ORAL_TABLET | Freq: Every evening | ORAL | 6 refills | Status: DC

## 2024-02-25 NOTE — Progress Notes (Signed)
 ENT CONSULT:  Reason for Consult: chronic cough with green mucus and throat clearing    HPI: Discussed the use of AI scribe software for clinical note transcription with the patient, who gave verbal consent to proceed.  History of Present Illness   Mary Gordon is a 78 year old female with hx of environmental allergies and [place] procedure for removal of the TGDC by Dr Jenne Pane in 2017 who presents with chronic throat clearing and coughing/green mucus for years.  She has experienced chronic throat clearing and coughing for many years, often producing green mucus. She associates the onset of these symptoms with the removal of a thyroglossal duct cyst in 2017. Since the surgery, she has felt a sensation of something being present on the right side of her throat, occasionally causing pills to get stuck. No issues with swallowing or eating are reported.  She has a history of acid reflux and is currently taking Protonix once daily, although it was recommended to be taken twice daily. She acknowledges possibly consuming foods that could trigger reflux and sometimes eating late at night. She has not recently reviewed dietary guidelines for managing reflux.  Allergy testing revealed a sensitivity to mold in the past, and she experiences symptoms related to pine pollen with worsening of baseline nasal congestion. She occasionally uses Afrin nasal spray for nasal congestion. She reports a history of sinus infections and was previously treated with Levaquin (seen by Dr Jenne Pane 11/2-24). She takes Chlorotrimethan, an antihistamine, two tablets in the morning and two at night, but not currently using any nasal sprays.     Records Reviewed:  Office Visit with Dr Jenne Pane ENT 10/28/23 1. Acute Sinusitis. The presence of discolored phlegm suggests sinus drainage rather than a stomach issue. She has a history of sinus troubles and infections, which have been part of her life for years. An antibiotic course for  sinus treatment will be initiated to see if it resolves her symptoms. A prescription for Levaquin, to be taken once daily for 10 days, was provided. If there is no improvement with the antibiotic, she should inform us so that we can consider a different antibiotic or conduct a scan.  2. Laryngopharyngeal Reflux Disease. She has been experiencing throat issues, including coughing and hoarseness, which have been attributed to acid reflux. Her Protonix dosage was increased to twice daily in August, and she has seen some improvement. The acid blockers will not be reduced at this time due to the observed improvement. She was advised to consult with a gastroenterologist regarding the ongoing use of acid blockers and the potential need for an endoscopy to check for changes in the esophagus, including precancerous changes.    Past Medical History:  Diagnosis Date   Allergy    all year   Anxiety    Bursitis of hip    Cataract    Cervical cancer (HCC) 1978   Chronic kidney disease    RENAL INSUFF   Depression    Dysrhythmia    PVC'S   Fibromyalgia    GERD (gastroesophageal reflux disease)    High cholesterol    History of Clostridium difficile infection    In past from augmentin   History of hiatal hernia    Osteopenia    Palpitations    Panic disorder    Pulmonary HTN (HCC) 10/20/2014   moderate with PASP   Pulmonary nodule    Being observed   Vitamin D deficiency     Past Surgical History:  Procedure Laterality Date   ABDOMINAL HYSTERECTOMY  1978   CARDIAC CATHETERIZATION     CATARACT EXTRACTION W/PHACO Right 07/14/2017   Procedure: CATARACT EXTRACTION PHACO AND INTRAOCULAR LENS PLACEMENT (IOC);  Surgeon: Galen Manila, MD;  Location: [facility] ORS;  Service: Ophthalmology;  Laterality: Right;  [place] 00:31 AP% 15.7 CDE 4.97 Fluid pack lot # [id number]   CATARACT EXTRACTION W/PHACO Left 08/04/2017   Procedure: CATARACT EXTRACTION PHACO AND INTRAOCULAR LENS PLACEMENT (IOC);  Surgeon:  Galen Manila, MD;  Location: [facility] ORS;  Service: Ophthalmology;  Laterality: Left;  [place] 00:34 AP% 18.7 CDE 6.42 Fluid pack lot # [id number]   COLONOSCOPY  2002   Normal   HAND SURGERY  2005   Left hand thrombosis   NASAL HEMORRHAGE CONTROL  2007   RIGHT HEART CATHETERIZATION N/A 03/16/2015   Procedure: RIGHT HEART CATH;  Surgeon: Laurey Morale, MD;  Location: [facility] CATH LAB;  Service: Cardiovascular;  Laterality: N/A;   SHOULDER SURGERY  2008   Right shoulder bone spur   THYROGLOSSAL DUCT CYST      Family History  Problem Relation Age of Onset   Heart attack Mother 74   Emphysema Mother    Allergies Mother    Asthma Mother    Allergies Father    Glaucoma Father    Arrhythmia Sister        Atrial fibrillation   Cancer Sister        breast   Breast cancer Sister    Emphysema Sister    Heart disease Sister    Rheumatologic disease Sister    Lung cancer Sister    Diabetes Other        Parent   Hyperlipidemia Other        Other relative   Hypertension Other        Parent, other relative   Breast cancer Other        Other relative, sister   Allergies Other        Siblings   Asthma Other        Sisters   Colon cancer Neg Hx    Esophageal cancer Neg Hx    Rectal cancer Neg Hx    Stomach cancer Neg Hx     Social History:  reports that she quit smoking about 45 years ago. Her smoking use included cigarettes. She started smoking about 62 years ago. She has a 17 pack-year smoking history. She has never used smokeless tobacco. She reports that she does not drink alcohol and does not use drugs.  Allergies:  Allergies  Allergen Reactions   Adhesive [Tape] Rash and Other (See Comments)    "Burns" the skin   Amoxicillin-Pot Clavulanate Other (See Comments)    Resulted in C-DIFF!!!!   Paxlovid [Nirmatrelvir-Ritonavir] Anaphylaxis    Throat swelling    Macrobid [Nitrofurantoin] Diarrhea    Severe abdominal cramping   Simvastatin Other (See Comments)    Caused severe  muscle pain    Medications: I have reviewed the patient's current medications.  The PMH, PSH, Medications, Allergies, and SH were reviewed and updated.  ROS: Constitutional: Negative for fever, weight loss and weight gain. Cardiovascular: Negative for chest pain and dyspnea on exertion. Respiratory: Is not experiencing shortness of breath at rest. Gastrointestinal: Negative for nausea and vomiting. Neurological: Negative for headaches. Psychiatric: The patient is not nervous/anxious  Blood pressure 134/75, pulse 86, height 5\' 4"  (1.626 m), weight 165 lb (74.8 kg), SpO2 96%. Body mass index is 28.32 kg/m.  PHYSICAL EXAM:  Exam: General: Well-developed, well-nourished Respiratory Respiratory effort: Equal inspiration and expiration without stridor Cardiovascular Peripheral Vascular: Warm extremities with equal color/perfusion Eyes: No nystagmus with equal extraocular motion bilaterally Neuro/Psych/Balance: Patient oriented to person, place, and time; Appropriate mood and affect; Gait is intact with no imbalance; Cranial nerves I-XII are intact Head and Face Inspection: Normocephalic and atraumatic without mass or lesion Palpation: Facial skeleton intact without bony stepoffs Salivary Glands: No mass or tenderness Facial Strength: Facial motility symmetric and full bilaterally ENT Pinna: External ear intact and fully developed External canal: Canal is patent with intact skin Tympanic Membrane: Clear and mobile External Nose: No scar or anatomic deformity Internal Nose: Septum is severely deviated to the left with near complete obstruction 2/2 septal deviation on the left, right side is more patent. No polyp, or purulence. Mucosal edema and erythema present.  Bilateral inferior turbinate hypertrophy.  Lips, Teeth, and gums: Mucosa and teeth intact and viable TMJ: No pain to palpation with full mobility Oral cavity/oropharynx: No erythema or exudate, no lesions  present Nasopharynx: No mass or lesion with intact mucosa Hypopharynx: Intact mucosa without pooling of secretions Larynx Glottic: Full true vocal cord mobility without lesion or mass Supraglottic: Normal appearing epiglottis and AE folds Interarytenoid Space: Moderate pachydermia&edema Subglottic Space: Patent without lesion or edema Neck Neck and Trachea: Midline trachea without mass or lesion Thyroid: No mass or nodularity Lymphatics: No lymphadenopathy  Procedure: Preoperative diagnosis: throat clearing and chronic cough   Postoperative diagnosis:   Same + GERD LPR  Procedure: Flexible fiberoptic laryngoscopy  Surgeon: Ashok Croon, MD  Anesthesia: Topical lidocaine and Afrin Complications: None Condition is stable throughout exam  Indications and consent:  The patient presents to the clinic with above symptoms. Indirect laryngoscopy view was incomplete. Thus it was recommended that they undergo a flexible fiberoptic laryngoscopy. All of the risks, benefits, and potential complications were reviewed with the patient preoperatively and verbal informed consent was obtained.  Procedure: The patient was seated upright in the clinic. Topical lidocaine and Afrin were applied to the nasal cavity. After adequate anesthesia had occurred, I then proceeded to pass the flexible telescope into the nasal cavity. The nasal cavity was patent without rhinorrhea or polyp. The nasopharynx was also patent without mass or lesion. The base of tongue was visualized and was normal. There were no signs of pooling of secretions in the piriform sinuses. The true vocal folds were mobile bilaterally. There were no signs of glottic or supraglottic mucosal lesion or mass. There was moderate interarytenoid pachydermia and post cricoid edema. The telescope was then slowly withdrawn and the patient tolerated the procedure throughout.    PROCEDURE NOTE: nasal endoscopy  Preoperative diagnosis: chronic  sinusitis nasal congestion symptoms  Postoperative diagnosis: same  Procedure: Diagnostic nasal endoscopy (16109)  Surgeon: Ashok Croon, M.D.  Anesthesia: Topical lidocaine and Afrin  H&P REVIEW: The patient's history and physical were reviewed today prior to procedure. All medications were reviewed and updated as well. Complications: None Condition is stable throughout exam Indications and consent: The patient presents with symptoms of chronic sinusitis not responding to previous therapies. All the risks, benefits, and potential complications were reviewed with the patient preoperatively and informed consent was obtained. The time out was completed with confirmation of the correct procedure.   Procedure: The patient was seated upright in the clinic. Topical lidocaine and Afrin were applied to the nasal cavity. After adequate anesthesia had occurred, the rigid nasal endoscope was passed into the nasal cavity.  The nasal mucosa, turbinates, septum, and sinus drainage pathways were visualized bilaterally. This revealed no purulence or significant secretions that might be cultured. There were no polyps or sites of significant inflammation. The mucosa was intact and there was no crusting present. The scope was then slowly withdrawn and the patient tolerated the procedure well. There were no complications or blood loss.   Studies Reviewed: CXR 07/29/23 The cardiomediastinal silhouette is unchanged with normal heart size. The lungs are hyperinflated with chronic interstitial coarsening. No acute airspace consolidation, overt pulmonary edema, pleural effusion, or pneumothorax is identified. Mild chronic midthoracic vertebral compression fractures are again noted.   IMPRESSION: No evidence of acute cardiopulmonary process.  Assessment/Plan: Encounter Diagnoses  Name Primary?   Globus sensation Yes   Chronic GERD    Chronic nasal congestion    Post-nasal drip    Chronic throat clearing     Nasal septal deviation    Nasal obstruction    Hypertrophy of both inferior nasal turbinates    Environmental and seasonal allergies     Assessment and Plan    Chronic throat clearing and cough Chronic throat clearing and cough with green mucus production, ongoing for years. Symptoms predate thyroglossal duct cyst removal in 2017. No mass, tumor, or lesion on examination today, including during flexible laryngoscopy and nasal endoscopy. Reflux changes and clear postnasal drainage present on exam. She also had mild VF atrophy.  Symptoms likely related to silent reflux and postnasal drainage rather than previous surgery.  - Recommend lifestyle modifications for reflux management, including dietary changes. - Suggest seaweed-based supplement Reflux Gourmet for reflux prevention, available on [employer]. - Provide educational material on reflux management, including Albertina Parr resources. - Prescribed Xyzal as an alternative allergy medication. - Prescribed Flonase for nasal congestion management. - Advised against habitual Afrin use due to rebound congestion risk.  GERD LPR Silent reflux suspected as a contributing factor to throat symptoms. Protonix taken once daily reduces stomach acid but does not prevent reflux. Symptoms include throat clearing, sensation of mucus or a lump. Addressing reflux and postnasal drainage simultaneously may provide symptom relief. - Continue Protonix once daily. -  Reflux Gourmet after meals - diet and lifestyle changes to minimize GERD - Refer to [employer] blog for dietary and lifestyle modifications/reflux cook book  Chronic nasal congestion Postnasal drainage Postnasal drainage contributing to throat symptoms. Allergy testing revealed mold sensitivity. Current use of Chlorotrimethan and occasional Afrin use. No signs of sinus infection or green mucus on scope exam today. Resistance to current allergy medication may occur, suggesting a switch to another  antihistamine may be beneficial. - Prescribed Xyzal 5 mg as an alternative allergy medication. - Prescribe Flonase for nasal congestion management 2 puffs b/l nares. - Advise against habitual Afrin use due to rebound congestion risk.  Septal deviation ITH on exam Septal deviation with the left side narrower than the right, possibly contributing to nasal congestion.  - Prescribed Flonase for nasal congestion management. - Xyzal daily - Advised against habitual Afrin use due to rebound congestion risk.   Thank you for allowing me to participate in the care of this patient. Please do not hesitate to contact me with any questions or concerns.   Ashok Croon, MD Otolaryngology [facility] Phone: [phone] Fax: [phone]    02/25/2024, 1:59 PM

## 2024-02-25 NOTE — Patient Instructions (Signed)

## 2024-03-15 ENCOUNTER — Other Ambulatory Visit (INDEPENDENT_AMBULATORY_CARE_PROVIDER_SITE_OTHER): Payer: HMO

## 2024-03-15 DIAGNOSIS — R7989 Other specified abnormal findings of blood chemistry: Secondary | ICD-10-CM

## 2024-03-16 ENCOUNTER — Encounter: Payer: Self-pay | Admitting: Family Medicine

## 2024-03-16 LAB — VITAMIN B12: Vitamin B-12: 773 pg/mL (ref 211–911)

## 2024-03-17 ENCOUNTER — Telehealth: Payer: Self-pay | Admitting: *Deleted

## 2024-03-17 ENCOUNTER — Other Ambulatory Visit: Payer: Self-pay | Admitting: Family Medicine

## 2024-03-17 NOTE — Telephone Encounter (Signed)
 Left VM requesting pt to call the office back

## 2024-03-17 NOTE — Telephone Encounter (Signed)
 While nothing will make her feel perfect , going up on dose may help more with anxiety  We can go up to 40 mg daily -does she want to do that?  If so what pharmacy?

## 2024-03-17 NOTE — Telephone Encounter (Signed)
 Copied from CRM [id number]. Topic: Clinical - Medication Question >> Mar 17, 2024  1:37 PM Almira Coaster wrote: Reason for CRM: Patient was prescribed FLUoxetine (PROZAC) 20 MG capsule during her last appointment with Dr.[name], Patient was advised to call back to see how the medication was working. Patient states it's working okay but still feels anxious at times, she does not know if she needs an increase.

## 2024-03-18 ENCOUNTER — Telehealth: Payer: Self-pay

## 2024-03-18 MED ORDER — FLUOXETINE HCL 40 MG PO CAPS: 40.0000 mg | ORAL_CAPSULE | Freq: Every day | ORAL | 2 refills | Status: DC

## 2024-03-18 NOTE — Telephone Encounter (Signed)
 Left VM requesting pt to call the office back

## 2024-03-18 NOTE — Telephone Encounter (Signed)
 See separate phone note regarding this

## 2024-03-18 NOTE — Telephone Encounter (Signed)
 I sent it  If any side effects /problems or if she feels worse this dose-drop back to the 20 mg and call us   Follow up in 4-6 weeks

## 2024-03-18 NOTE — Telephone Encounter (Signed)
 Copied from CRM [id number]. Topic: Clinical - Medication Question >> Mar 17, 2024  1:37 PM Almira Coaster wrote: Reason for CRM: Patient was prescribed FLUoxetine (PROZAC) 20 MG capsule during her last appointment with Dr.[name], Patient was advised to call back to see how the medication was working. Patient states it's working okay but still feels anxious at times, she does not know if she needs an increase. >> Mar 18, 2024  9:38 AM [place] J wrote: Patient called in to return a missed call from the office yesterday. In response to Dr. Milinda Antis, patient is willing to up the dose to 40mg . Preferred pharmacy is:  [id number] [place], [place] - [address]  [address] [place] [address]

## 2024-03-28 DIAGNOSIS — N951 Menopausal and female climacteric states: Secondary | ICD-10-CM | POA: Diagnosis not present

## 2024-03-28 DIAGNOSIS — E039 Hypothyroidism, unspecified: Secondary | ICD-10-CM | POA: Diagnosis not present

## 2024-03-29 NOTE — Telephone Encounter (Signed)
 Pt called back and scheduled the appt

## 2024-03-30 ENCOUNTER — Ambulatory Visit (INDEPENDENT_AMBULATORY_CARE_PROVIDER_SITE_OTHER): Admitting: Family Medicine

## 2024-03-30 ENCOUNTER — Encounter: Payer: Self-pay | Admitting: Family Medicine

## 2024-03-30 VITALS — BP 112/64 | HR 73 | Temp 98.2°F | Ht 64.0 in | Wt 165.4 lb

## 2024-03-30 DIAGNOSIS — N951 Menopausal and female climacteric states: Secondary | ICD-10-CM | POA: Diagnosis not present

## 2024-03-30 DIAGNOSIS — F4323 Adjustment disorder with mixed anxiety and depressed mood: Secondary | ICD-10-CM

## 2024-03-30 DIAGNOSIS — R5383 Other fatigue: Secondary | ICD-10-CM | POA: Diagnosis not present

## 2024-03-30 DIAGNOSIS — Z6827 Body mass index (BMI) 27.0-27.9, adult: Secondary | ICD-10-CM | POA: Diagnosis not present

## 2024-03-30 DIAGNOSIS — R232 Flushing: Secondary | ICD-10-CM | POA: Diagnosis not present

## 2024-03-30 MED ORDER — FLUOXETINE HCL 10 MG PO CAPS
30.0000 mg | ORAL_CAPSULE | Freq: Every day | ORAL | 3 refills | Status: DC
Start: 2024-03-30 — End: 2024-04-29

## 2024-03-30 NOTE — Progress Notes (Signed)
 Subjective:    Patient ID: Mary Gordon, female    DOB: 1946-02-28, 78 y.o.   MRN: [id number]  HPI  Wt Readings from Last 3 Encounters:  03/30/24 165 lb 6 oz (75 kg)  02/25/24 165 lb (74.8 kg)  02/24/24 170 lb 8 oz (77.3 kg)   28.39 kg/m  Vitals:   03/30/24 1040  BP: 112/64  Pulse: 73  Temp: 98.2 F (36.8 C)  SpO2: 92%   Pt presents for follow up of mood /depression    Last visit was struggling more with stressors at home  Marriage, home, financial  Was more down/ some isolation and anhedonia  Previously on zoloft   We changed this to fluoxetine  20 mg daily (noted a little improvement)  We went up on dose to 40 mg daily  Takes trazodone  for sleep  Referred for counseling   Overall improved at higher dose  Mood is better  More motivated to do things like housework  Feels better about herself  Able to get satisfaction from things   Sleep- great  Appetite -pretty good (did decrease a little bit)   Amount of stress is the same/ but dealing with it better  She is learning to say no     Memory - -word finding  Dizziness-balance is not as good at those times   (more lightheaded than spinning) No falls  No head injury   Nothing for exercise  Active in the house / but no intentional exercise          02/24/2024    8:33 AM 10/28/2023   11:00 AM 04/27/2023   12:42 PM 10/24/2022    2:10 PM 10/09/2021    8:26 AM  Depression screen PHQ 2/9  Decreased Interest 3 0 0 0 0  Down, Depressed, Hopeless 3 1 0 0 0  PHQ - 2 Score 6 1 0 0 0  Altered sleeping 0  1    Tired, decreased energy 3  1    Change in appetite 3  0    Feeling bad or failure about yourself  2  0    Trouble concentrating 3  0    Moving slowly or fidgety/restless 0  0    Suicidal thoughts 0  0    PHQ-9 Score 17  2    Difficult doing work/chores Very difficult  Not difficult at all        02/24/2024    8:33 AM 04/27/2023   12:42 PM  GAD 7 : Generalized Anxiety Score  Nervous,  Anxious, on Edge 3 0  Control/stop worrying 3 0  Worry too much - different things 3 0  Trouble relaxing 0 0  Restless 3 0  Easily annoyed or irritable 1 0  Afraid - awful might happen 1 0  Total GAD 7 Score 14 0  Anxiety Difficulty Very difficult       Patient Active Problem List   Diagnosis Date Noted   Vitamin B12 deficiency 02/24/2024   Allergic reaction 11/18/2023   Chronic cough 07/29/2023   Constipation 04/27/2023   Hair loss 08/05/2022   Brittle nails 08/05/2022   Low vitamin B12 level 08/05/2022   Change in voice 06/08/2022   Estrogen deficiency 09/07/2020   Laryngopharyngeal reflux (LPR) 01/08/2017   Pharyngeal dysphagia 01/08/2017   Prediabetes 08/25/2016   Routine general medical examination at a health care facility 08/19/2016   Stress reaction 09/17/2015   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   Rosacea 12/27/2014   PVCs (  premature ventricular contractions) 09/26/2014   Fatigue 05/31/2012   Fibromyalgia 05/31/2012   GERD (gastroesophageal reflux disease) 07/08/2011   Brachial neuritis or radiculitis 09/04/2010   PULMONARY NODULE 09/14/2009   PALPITATIONS 08/17/2009   Vitamin D  deficiency 02/22/2009   HYPERCHOLESTEROLEMIA 02/22/2009   Adjustment disorder with mixed anxiety and depressed mood 02/22/2009   Allergic rhinitis 02/22/2009   MENOPAUSAL SYNDROME 02/22/2009   Osteopenia 02/22/2009   Past Medical History:  Diagnosis Date   Allergy     all year   Anxiety    Bursitis of hip    Cataract    Cervical cancer (HCC) 1978   Chronic kidney disease    RENAL INSUFF   Depression    Dysrhythmia    PVC'S   Fibromyalgia    GERD (gastroesophageal reflux disease)    High cholesterol    History of Clostridium difficile infection    In past from augmentin   History of hiatal hernia    Osteopenia    Palpitations    Panic disorder    Pulmonary HTN (HCC) 10/20/2014   moderate with PASP   Pulmonary nodule    Being observed   Vitamin D  deficiency     Past Surgical History:  Procedure Laterality Date   ABDOMINAL HYSTERECTOMY  1978   CARDIAC CATHETERIZATION     CATARACT EXTRACTION W/PHACO Right 07/14/2017   Procedure: CATARACT EXTRACTION PHACO AND INTRAOCULAR LENS PLACEMENT (IOC);  Surgeon: Clair Crews, MD;  Location: [facility] ORS;  Service: Ophthalmology;  Laterality: Right;  US  00:31 AP% 15.7 CDE 4.97 Fluid pack lot # [id number]   CATARACT EXTRACTION W/PHACO Left 08/04/2017   Procedure: CATARACT EXTRACTION PHACO AND INTRAOCULAR LENS PLACEMENT (IOC);  Surgeon: Clair Crews, MD;  Location: [facility] ORS;  Service: Ophthalmology;  Laterality: Left;  US  00:34 AP% 18.7 CDE 6.42 Fluid pack lot # [id number]   COLONOSCOPY  2002   Normal   HAND SURGERY  2005   Left hand thrombosis   NASAL HEMORRHAGE CONTROL  2007   RIGHT HEART CATHETERIZATION N/A 03/16/2015   Procedure: RIGHT HEART CATH;  Surgeon: Darlis Eisenmenger, MD;  Location: [facility] CATH LAB;  Service: Cardiovascular;  Laterality: N/A;   SHOULDER SURGERY  2008   Right shoulder bone spur   THYROGLOSSAL DUCT CYST     Social History   Tobacco Use   Smoking status: Former    Current packs/day: 0.00    Average packs/day: 1 pack/day for 17.0 years (17.0 ttl pk-yrs)    Types: Cigarettes    Start date: 12/08/1961    Quit date: 12/08/1978    Years since quitting: 45.3   Smokeless tobacco: Never  Vaping Use   Vaping status: Never Used  Substance Use Topics   Alcohol use: No    Alcohol/week: 0.0 standard drinks of alcohol   Drug use: No   Family History  Problem Relation Age of Onset   Heart attack Mother 39   Emphysema Mother    Allergies Mother    Asthma Mother    Allergies Father    Glaucoma Father    Arrhythmia Sister        Atrial fibrillation   Cancer Sister        breast   Breast cancer Sister    Emphysema Sister    Heart disease Sister    Rheumatologic disease Sister    Lung cancer Sister    Diabetes Other        Parent   Hyperlipidemia Other  Other relative    Hypertension Other        Parent, other relative   Breast cancer Other        Other relative, sister   Allergies Other        Siblings   Asthma Other        Sisters   Colon cancer Neg Hx    Esophageal cancer Neg Hx    Rectal cancer Neg Hx    Stomach cancer Neg Hx    Allergies  Allergen Reactions   Adhesive [Tape] Rash and Other (See Comments)    "Burns" the skin   Amoxicillin-Pot Clavulanate Other (See Comments)    Resulted in C-DIFF!!!!   Paxlovid  [Nirmatrelvir -Ritonavir ] Anaphylaxis    Throat swelling    Macrobid  [Nitrofurantoin ] Diarrhea    Severe abdominal cramping   Simvastatin  Other (See Comments)    Caused severe muscle pain   Current Outpatient Medications on File Prior to Visit  Medication Sig Dispense Refill   cyanocobalamin  1000 MCG tablet Take 1,000 mcg by mouth daily.     famotidine  (PEPCID ) 20 MG tablet Take 1 tablet (20 mg total) by mouth 2 (two) times daily. 180 tablet 3   levocetirizine (XYZAL  ALLERGY  24HR) 5 MG tablet Take 1 tablet (5 mg total) by mouth every evening. 30 tablet 6   NONFORMULARY OR COMPOUNDED ITEM compound testosterone  cream     OVER THE COUNTER MEDICATION Take 5-7 mLs by mouth See admin instructions. Life Tones (uric acid cleanser- has celery extract and other herbs) liquid: Take 5-7 ml's by mouth once a day     pantoprazole  (PROTONIX ) 40 MG tablet Take 1 tablet (40 mg total) by mouth 2 (two) times daily. (Patient taking differently: Take 40 mg by mouth daily.) 180 tablet 3   PROGESTERONE  PO Take 1 capsule by mouth daily.     rosuvastatin  (CRESTOR ) 5 MG tablet TAKE 1 TABLET BY MOUTH TWICE A WEEK ON MONDAY/THURSDAY 24 tablet 3   traZODone  (DESYREL ) 100 MG tablet TAKE 1 TABLET BY MOUTH EVERYDAY AT BEDTIME 90 tablet 3   No current facility-administered medications on file prior to visit.    Review of Systems  Constitutional:  Negative for activity change, appetite change, fatigue, fever and unexpected weight change.  HENT:  Negative for  congestion, ear pain, rhinorrhea, sinus pressure and sore throat.   Eyes:  Negative for pain, redness and visual disturbance.  Respiratory:  Negative for cough, shortness of breath and wheezing.   Cardiovascular:  Negative for chest pain and palpitations.  Gastrointestinal:  Negative for abdominal pain, blood in stool, constipation and diarrhea.  Endocrine: Negative for polydipsia and polyuria.  Genitourinary:  Negative for dysuria, frequency and urgency.  Musculoskeletal:  Negative for arthralgias, back pain and myalgias.  Skin:  Negative for pallor and rash.  Allergic/Immunologic: Negative for environmental allergies.  Neurological:  Positive for light-headedness. Negative for dizziness, syncope and headaches.  Hematological:  Negative for adenopathy. Does not bruise/bleed easily.  Psychiatric/Behavioral:  Positive for decreased concentration and dysphoric mood. Negative for self-injury, sleep disturbance and suicidal ideas. The patient is nervous/anxious.        Objective:   Physical Exam Constitutional:      General: She is not in acute distress.    Appearance: Normal appearance. She is normal weight. She is not ill-appearing.  HENT:     Head: Normocephalic and atraumatic.  Cardiovascular:     Rate and Rhythm: Normal rate and regular rhythm.  Pulmonary:     Effort:  Pulmonary effort is normal. No respiratory distress.  Neurological:     Mental Status: She is alert.     Cranial Nerves: No cranial nerve deficit.     Coordination: Coordination normal.     Gait: Gait normal.  Psychiatric:        Attention and Perception: Attention normal.        Mood and Affect: Mood is not depressed.        Speech: Speech normal.        Behavior: Behavior normal.        Cognition and Memory: Cognition and memory normal.     Comments: Overall improved mood  Not tearful   Candidly discusses symptoms and stressors             Assessment & Plan:   Problem List Items Addressed This Visit        Other   Adjustment disorder with mixed anxiety and depressed mood - Primary   Overall improved on fluoxetine  20 and more improvement at 40 but some side effects (word finding/memory and also occational light headed feeling) Decided to back down to 30 mg and see if this helps If so may be able to titrate up later if doing well  Strongly encouraged to contact insurance re: coverage for mental health counseling   Reviewed stressors/ coping techniques/symptoms/ support sources/ tx options and side effects in detail today   Encouraged more exercise as tolerated  Also socialization and outdoor time  Encouraged self care  Follow up 4-6 weeks

## 2024-03-30 NOTE — Patient Instructions (Addendum)
 Do check in with your insurance about coverage for counseling   Let us  know if you need a new referral   Let's go down to 30 mg on the fluoxetine  daily and see if side effects improve    When you feel better - start some walking  Take it slow  Exercise and outdoor time help mood a lot   Follow up in 4-6 weeks (earlier if needed)   Give us  a call if any concerns

## 2024-03-30 NOTE — Assessment & Plan Note (Signed)
 Overall improved on fluoxetine  20 and more improvement at 40 but some side effects (word finding/memory and also occational light headed feeling) Decided to back down to 30 mg and see if this helps If so may be able to titrate up later if doing well  Strongly encouraged to contact insurance re: coverage for mental health counseling   Reviewed stressors/ coping techniques/symptoms/ support sources/ tx options and side effects in detail today   Encouraged more exercise as tolerated  Also socialization and outdoor time  Encouraged self care  Follow up 4-6 weeks

## 2024-04-23 ENCOUNTER — Other Ambulatory Visit: Payer: Self-pay | Admitting: Family Medicine

## 2024-04-25 ENCOUNTER — Other Ambulatory Visit: Payer: Self-pay | Admitting: Family Medicine

## 2024-04-29 ENCOUNTER — Encounter: Payer: Self-pay | Admitting: Family Medicine

## 2024-04-29 ENCOUNTER — Ambulatory Visit (INDEPENDENT_AMBULATORY_CARE_PROVIDER_SITE_OTHER): Admitting: Family Medicine

## 2024-04-29 VITALS — BP 110/60 | HR 70 | Temp 98.1°F | Ht 64.0 in | Wt 161.4 lb

## 2024-04-29 DIAGNOSIS — M25562 Pain in left knee: Secondary | ICD-10-CM | POA: Diagnosis not present

## 2024-04-29 DIAGNOSIS — G8929 Other chronic pain: Secondary | ICD-10-CM | POA: Diagnosis not present

## 2024-04-29 DIAGNOSIS — F4323 Adjustment disorder with mixed anxiety and depressed mood: Secondary | ICD-10-CM | POA: Diagnosis not present

## 2024-04-29 NOTE — Progress Notes (Signed)
 Subjective:    Patient ID: Mary Gordon, female    DOB: 1946-04-03, 78 y.o.   MRN: [id number]  HPI  Wt Readings from Last 3 Encounters:  04/29/24 161 lb 6 oz (73.2 kg)  03/30/24 165 lb 6 oz (75 kg)  02/25/24 165 lb (74.8 kg)   27.70 kg/m  Vitals:   04/29/24 1129  BP: 110/60  Pulse: 70  Temp: 98.1 F (36.7 C)  SpO2: 94%   Pt presents for c/o  Issues with fluoxetine  Left knee pain and swelling   Last visit noted fluoxetine  40 mg was helping mood but caused word finding issues and light headedness We cut down to 30 mg  Encouraged her to reach out to insurance co re: coverage of counseling  Encouraged self care   This did not help  Going down to 30 from 40 did not help  So she stopped it entirely  Now fog has lifted and balance is better   Was on sertraline  prior to that -did no thelp   Mood-issues with low motivation  No SI at all   Getting through it   Has a lot going on stress wise          02/24/2024    8:33 AM 10/28/2023   11:00 AM 04/27/2023   12:42 PM 10/24/2022    2:10 PM 10/09/2021    8:26 AM  Depression screen PHQ 2/9  Decreased Interest 3 0 0 0 0  Down, Depressed, Hopeless 3 1 0 0 0  PHQ - 2 Score 6 1 0 0 0  Altered sleeping 0  1    Tired, decreased energy 3  1    Change in appetite 3  0    Feeling bad or failure about yourself  2  0    Trouble concentrating 3  0    Moving slowly or fidgety/restless 0  0    Suicidal thoughts 0  0    PHQ-9 Score 17  2    Difficult doing work/chores Very difficult  Not difficult at all        02/24/2024    8:33 AM 04/27/2023   12:42 PM  GAD 7 : Generalized Anxiety Score  Nervous, Anxious, on Edge 3 0  Control/stop worrying 3 0  Worry too much - different things 3 0  Trouble relaxing 0 0  Restless 3 0  Easily annoyed or irritable 1 0  Afraid - awful might happen 1 0  Total GAD 7 Score 14 0  Anxiety Difficulty Very difficult      Left knee pain Saw Dr Geralyn Knee 05/2023  Did injections    Last injection did not help a lot and her blood pressure shot up afterwards   Is painful  Is swollen -slightly  Medial knee  No redness or heat   Exercise weight bearing makes it worse Cannot play pickle ball in over a year  Steps are hard  Can walk short distances   Has not tried pedaling  No water exercise due to length of drive to get to pool     Last knee film left  2022 Narrative & Impression  CLINICAL DATA:  Left knee pain since fall 3 weeks ago   EXAM: LEFT KNEE - COMPLETE 4+ VIEW   COMPARISON:  Left knee radiograph 08/18/2014   FINDINGS: No evidence of fracture, dislocation, or joint effusion. No evidence of arthropathy or other focal bone abnormality. Soft tissues are unremarkable.   IMPRESSION: Negative.  Patient Active Problem List   Diagnosis Date Noted   Vitamin B12 deficiency 02/24/2024   Allergic reaction 11/18/2023   Chronic cough 07/29/2023   Constipation 04/27/2023   Hair loss 08/05/2022   Brittle nails 08/05/2022   Low vitamin B12 level 08/05/2022   Change in voice 06/08/2022   Estrogen deficiency 09/07/2020   Laryngopharyngeal reflux (LPR) 01/08/2017   Pharyngeal dysphagia 01/08/2017   Prediabetes 08/25/2016   Routine general medical examination at a health care facility 08/19/2016   Stress reaction 09/17/2015   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   Rosacea 12/27/2014   PVCs (premature ventricular contractions) 09/26/2014   Left knee pain 08/18/2014   Fatigue 05/31/2012   Fibromyalgia 05/31/2012   GERD (gastroesophageal reflux disease) 07/08/2011   Brachial neuritis or radiculitis 09/04/2010   PULMONARY NODULE 09/14/2009   PALPITATIONS 08/17/2009   Vitamin D  deficiency 02/22/2009   HYPERCHOLESTEROLEMIA 02/22/2009   Adjustment disorder with mixed anxiety and depressed mood 02/22/2009   Allergic rhinitis 02/22/2009   MENOPAUSAL SYNDROME 02/22/2009   Osteopenia 02/22/2009   Past Medical History:  Diagnosis Date   Allergy      all year   Anxiety    Bursitis of hip    Cataract    Cervical cancer (HCC) 1978   Chronic kidney disease    RENAL INSUFF   Depression    Dysrhythmia    PVC'S   Fibromyalgia    GERD (gastroesophageal reflux disease)    High cholesterol    History of Clostridium difficile infection    In past from augmentin   History of hiatal hernia    Osteopenia    Palpitations    Panic disorder    Pulmonary HTN (HCC) 10/20/2014   moderate with PASP   Pulmonary nodule    Being observed   Vitamin D  deficiency    Past Surgical History:  Procedure Laterality Date   ABDOMINAL HYSTERECTOMY  1978   CARDIAC CATHETERIZATION     CATARACT EXTRACTION W/PHACO Right 07/14/2017   Procedure: CATARACT EXTRACTION PHACO AND INTRAOCULAR LENS PLACEMENT (IOC);  Surgeon: Clair Crews, MD;  Location: [facility] ORS;  Service: Ophthalmology;  Laterality: Right;  US  00:31 AP% 15.7 CDE 4.97 Fluid pack lot # [id number]   CATARACT EXTRACTION W/PHACO Left 08/04/2017   Procedure: CATARACT EXTRACTION PHACO AND INTRAOCULAR LENS PLACEMENT (IOC);  Surgeon: Clair Crews, MD;  Location: [facility] ORS;  Service: Ophthalmology;  Laterality: Left;  US  00:34 AP% 18.7 CDE 6.42 Fluid pack lot # [id number]   COLONOSCOPY  2002   Normal   HAND SURGERY  2005   Left hand thrombosis   NASAL HEMORRHAGE CONTROL  2007   RIGHT HEART CATHETERIZATION N/A 03/16/2015   Procedure: RIGHT HEART CATH;  Surgeon: Darlis Eisenmenger, MD;  Location: [facility] CATH LAB;  Service: Cardiovascular;  Laterality: N/A;   SHOULDER SURGERY  2008   Right shoulder bone spur   THYROGLOSSAL DUCT CYST     Social History   Tobacco Use   Smoking status: Former    Current packs/day: 0.00    Average packs/day: 1 pack/day for 17.0 years (17.0 ttl pk-yrs)    Types: Cigarettes    Start date: 12/08/1961    Quit date: 12/08/1978    Years since quitting: 45.4   Smokeless tobacco: Never  Vaping Use   Vaping status: Never Used  Substance Use Topics   Alcohol use: No     Alcohol/week: 0.0 standard drinks of alcohol   Drug use: No   Family History  Problem Relation  Age of Onset   Heart attack Mother 7   Emphysema Mother    Allergies Mother    Asthma Mother    Allergies Father    Glaucoma Father    Arrhythmia Sister        Atrial fibrillation   Cancer Sister        breast   Breast cancer Sister    Emphysema Sister    Heart disease Sister    Rheumatologic disease Sister    Lung cancer Sister    Diabetes Other        Parent   Hyperlipidemia Other        Other relative   Hypertension Other        Parent, other relative   Breast cancer Other        Other relative, sister   Allergies Other        Siblings   Asthma Other        Sisters   Colon cancer Neg Hx    Esophageal cancer Neg Hx    Rectal cancer Neg Hx    Stomach cancer Neg Hx    Allergies  Allergen Reactions   Adhesive [Tape] Rash and Other (See Comments)    "Burns" the skin   Amoxicillin-Pot Clavulanate Other (See Comments)    Resulted in C-DIFF!!!!   Paxlovid  [Nirmatrelvir -Ritonavir ] Anaphylaxis    Throat swelling    Macrobid  [Nitrofurantoin ] Diarrhea    Severe abdominal cramping   Simvastatin  Other (See Comments)    Caused severe muscle pain   Current Outpatient Medications on File Prior to Visit  Medication Sig Dispense Refill   cyanocobalamin  1000 MCG tablet Take 1,000 mcg by mouth daily.     famotidine  (PEPCID ) 20 MG tablet Take 1 tablet (20 mg total) by mouth 2 (two) times daily. (Patient taking differently: Take 20 mg by mouth daily.) 180 tablet 3   levocetirizine (XYZAL  ALLERGY  24HR) 5 MG tablet Take 1 tablet (5 mg total) by mouth every evening. 30 tablet 6   NONFORMULARY OR COMPOUNDED ITEM compound testosterone  cream     OVER THE COUNTER MEDICATION Take 5-7 mLs by mouth See admin instructions. Life Tones (uric acid cleanser- has celery extract and other herbs) liquid: Take 5-7 ml's by mouth once a day     pantoprazole  (PROTONIX ) 40 MG tablet Take 1 tablet (40 mg  total) by mouth 2 (two) times daily. (Patient taking differently: Take 40 mg by mouth daily.) 180 tablet 3   PROGESTERONE  PO Take 1 capsule by mouth daily.     rosuvastatin  (CRESTOR ) 5 MG tablet TAKE 1 TABLET BY MOUTH TWICE A WEEK ON MONDAY/THURSDAY 24 tablet 3   traZODone  (DESYREL ) 100 MG tablet TAKE 1 TABLET BY MOUTH EVERYDAY AT BEDTIME 90 tablet 3   No current facility-administered medications on file prior to visit.    Review of Systems  Constitutional:  Negative for activity change, appetite change, fatigue, fever and unexpected weight change.  HENT:  Negative for congestion, ear pain, rhinorrhea, sinus pressure and sore throat.   Eyes:  Negative for pain, redness and visual disturbance.  Respiratory:  Negative for cough, shortness of breath and wheezing.   Cardiovascular:  Negative for chest pain and palpitations.  Gastrointestinal:  Negative for abdominal pain, blood in stool, constipation and diarrhea.  Endocrine: Negative for polydipsia and polyuria.  Genitourinary:  Negative for dysuria, frequency and urgency.  Musculoskeletal:  Positive for arthralgias. Negative for back pain and myalgias.  Skin:  Negative for pallor and  rash.  Allergic/Immunologic: Negative for environmental allergies.  Neurological:  Negative for dizziness, syncope and headaches.  Hematological:  Negative for adenopathy. Does not bruise/bleed easily.  Psychiatric/Behavioral:  Positive for dysphoric mood and sleep disturbance. Negative for decreased concentration, self-injury and suicidal ideas. The patient is nervous/anxious.        Objective:   Physical Exam Constitutional:      General: She is not in acute distress.    Appearance: Normal appearance. She is well-developed and normal weight. She is not ill-appearing or diaphoretic.  HENT:     Head: Normocephalic and atraumatic.  Eyes:     Conjunctiva/sclera: Conjunctivae normal.     Pupils: Pupils are equal, round, and reactive to light.  Neck:      Thyroid : No thyromegaly.     Vascular: No carotid bruit or JVD.  Cardiovascular:     Rate and Rhythm: Normal rate and regular rhythm.     Heart sounds: Normal heart sounds.     No gallop.  Pulmonary:     Effort: Pulmonary effort is normal. No respiratory distress.     Breath sounds: Normal breath sounds. No wheezing or rales.  Abdominal:     General: There is no distension or abdominal bruit.     Palpations: Abdomen is soft.  Musculoskeletal:     Cervical back: Normal range of motion and neck supple.     Right lower leg: No edema.     Left lower leg: Edema present.     Comments: Knee left  Mild swelling / cannot r/o mild effusion  No warmth to the touch  Mild crepitus  ROM: Flex-full Ext -some discomfort on full Mcmurray causes pain  Bounce test -unable   Stability: Anterior drawer-normal  Lachman exam -nl  Tenderness medial joint line   Gait favors right RLE     Lymphadenopathy:     Cervical: No cervical adenopathy.  Skin:    General: Skin is warm and dry.     Coloration: Skin is not pale.     Findings: No rash.  Neurological:     Mental Status: She is alert.     Coordination: Coordination normal.     Deep Tendon Reflexes: Reflexes are normal and symmetric. Reflexes normal.  Psychiatric:        Mood and Affect: Mood normal.           Assessment & Plan:   Problem List Items Addressed This Visit       Other   Left knee pain   Worsened (acute on chronic) Some degenerative changes  Several injections in past-briefly helpful  Ready for ortho referral -done Advised use of ice  Elevate when able  Change to low impact exercise  Diclofenac gel prn         Relevant Orders   Ambulatory referral to Orthopedic Surgery   Adjustment disorder with mixed anxiety and depressed mood - Primary   Sertraline  not effective in past  Fluoxetine  helped but caused short term memory issues/ brain fog and this became intolerable   Is off medication now  Stressors  are high  Reviewed stressors/ coping techniques/symptoms/ support sources/ tx options and side effects in detail today  Mental health counseling is strongly recommended Pt wants to check on cost of mental health copay before we do any referrals   Encouraged good self care Pt will reach out when ready for referral or list of possible providers

## 2024-04-29 NOTE — Patient Instructions (Addendum)
 Reach out to your ins co to see if copay for mental health counseling is affordable and let me know  I can re place the referral to [facility] or give you a list of numbers to call    I put the referral in for orthopedics in [facility]  Please let us  know if you don't hear in 1-2 weeks to set that up  Use ice on knee for 10 minutes at a time   Diclofenac gel or cream  over the counter  You can use that up to four times daily   Consider a trial of exercise bike for exercise while waiting for a plan   Take care of yourself

## 2024-04-30 NOTE — Assessment & Plan Note (Addendum)
 Worsened  Sertraline  not effective in past  Fluoxetine  helped but caused short term memory issues/ brain fog and this became intolerable   Is off medication now  Stressors are high  Reviewed stressors/ coping techniques/symptoms/ support sources/ tx options and side effects in detail today  Mental health counseling is strongly recommended Pt wants to check on cost of mental health copay before we do any referrals   Encouraged good self care Pt will reach out when ready for referral or list of possible providers

## 2024-04-30 NOTE — Assessment & Plan Note (Signed)
 Worsened (acute on chronic) Some degenerative changes  Several injections in past-briefly helpful  Ready for ortho referral -done Advised use of ice  Elevate when able  Change to low impact exercise  Diclofenac gel prn

## 2024-05-04 ENCOUNTER — Telehealth: Payer: Self-pay | Admitting: Family Medicine

## 2024-05-04 DIAGNOSIS — F4323 Adjustment disorder with mixed anxiety and depressed mood: Secondary | ICD-10-CM

## 2024-05-04 NOTE — Telephone Encounter (Signed)
 Copied from CRM [id number]. Topic: Referral - Request for Referral >> May 04, 2024  2:11 PM Taleah C wrote: Did the patient discuss referral with their provider in the last year? Yes (If No - schedule appointment) (If Yes - send message)  Appointment offered? No  Type of order/referral and detailed reason for visit: Behavioral Health  Preference of office, provider, location: n/a   If referral order, have you been seen by this specialty before? No (If Yes, this issue or another issue? When? Where?  Can we respond through MyChart? Yes   Pt also mentioned she also saw her Orthopedic on yesterday, 05/03/24.

## 2024-05-05 ENCOUNTER — Encounter: Payer: Self-pay | Admitting: Physician Assistant

## 2024-05-05 ENCOUNTER — Telehealth: Payer: Self-pay

## 2024-05-05 ENCOUNTER — Other Ambulatory Visit (INDEPENDENT_AMBULATORY_CARE_PROVIDER_SITE_OTHER): Payer: Self-pay

## 2024-05-05 ENCOUNTER — Ambulatory Visit: Admitting: Physician Assistant

## 2024-05-05 DIAGNOSIS — G8929 Other chronic pain: Secondary | ICD-10-CM

## 2024-05-05 DIAGNOSIS — M25562 Pain in left knee: Secondary | ICD-10-CM

## 2024-05-05 DIAGNOSIS — M1712 Unilateral primary osteoarthritis, left knee: Secondary | ICD-10-CM | POA: Diagnosis not present

## 2024-05-05 NOTE — Telephone Encounter (Signed)
 I put the referral in for  Please let us  know if you don't hear in 1-2 weeks to set that up   Please send for ortho note when ready  Thanks

## 2024-05-05 NOTE — Progress Notes (Signed)
 Office Visit Note   Patient: Mary Gordon           Date of Birth: Apr 03, 1946           MRN: [id number] Visit Date: 05/05/2024              Requested by: [name], Manley Seeds, MD [address] [place],  [place] [address] PCP: Mary Curt, MD   Assessment & Plan: Visit Diagnoses:  1. Chronic pain of left knee   2. Unilateral primary osteoarthritis, left knee     Plan: [name] is a pleasant 78 year old woman who is very active has enjoyed playing pickle ball.  She has been having ongoing problems with her left knee.  This has been going on for quite a while.  She notices when she goes up and down stairs she has difficulties.  She has difficulties trying to play pickle ball.  She did have x-rays a few years ago and x-rays shows very minimal degenerative changes.  She has a little bit of catching she could have a degenerative meniscus tear.  I think the next step would be for her to engage in a physical therapy.  She would like to do a home exercise program I gave her a handout and a Thera-Band and instructed her in the exercises to do 3-4 times a week.  I think she also would benefit from viscosupplementation.  She cannot do steroid injections as she had one once in it made her blood pressure go up quite a bit we will follow-up with me in a month maybe could coordinate Visco injection as well as reevaluation if she did not get better could consider an MRI This patient is diagnosed with osteoarthritis of the knee(s).    Radiographs show evidence of joint space narrowing, osteophytes, subchondral sclerosis and/or subchondral cysts.  This patient has knee pain which interferes with functional and activities of daily living.    This patient has experienced inadequate response, adverse effects and/or intolerance with conservative treatments such as acetaminophen , NSAIDS, topical creams, physical therapy or regular exercise, knee bracing and/or weight loss.   This patient has experienced  inadequate response or has a contraindication to intra articular steroid injections for at least 3 months.   This patient is not scheduled to have a total knee replacement within 6 months of starting treatment with viscosupplementation.   Follow-Up Instructions: No follow-ups on file.   Orders:  Orders Placed This Encounter  Procedures   XR KNEE 3 VIEW LEFT   No orders of the defined types were placed in this encounter.     Procedures: No procedures performed   Clinical Data: No additional findings.   Subjective: No chief complaint on file.   HPI Patient is a 78 year old woman comes in today complaining of left knee pain.  Been going on for years.  She has had an injection but it caused her quite a bit of increase in her blood pressure.  She finds stairs particularly painful and kneeling.  Standing for long periods of time.  Pain wakes her up out of her sleep.  She does not take any particular medication has not had any injuries Review of Systems  All other systems reviewed and are negative.    Objective: Vital Signs: There were no vitals taken for this visit.  Physical Exam Constitutional:      Appearance: Normal appearance.  Pulmonary:     Effort: Pulmonary effort is normal.  Skin:  General: Skin is warm and dry.  Neurological:     General: No focal deficit present.     Mental Status: She is alert and oriented to person, place, and time.  Psychiatric:        Mood and Affect: Mood normal.        Behavior: Behavior normal.    Ortho Exam Examination of her left knee she has no swelling no effusion she has general tenderness medially and laterally.  She does have grinding with range of motion she has good endpoint on anterior draw good varus and valgus stability compartments are soft and nontender she is neurovascularly intact Specialty Comments:  No specialty comments available.  Imaging: XR KNEE 3 VIEW LEFT Result Date: 05/05/2024 Three-view radiographs of  her knee demonstrate no evidence of arthropathy minimal degenerative changes no evidence of fracture well-maintained alignment    PMFS History: Patient Active Problem List   Diagnosis Date Noted   Unilateral primary osteoarthritis, left knee 05/05/2024   Vitamin B12 deficiency 02/24/2024   Allergic reaction 11/18/2023   Chronic cough 07/29/2023   Constipation 04/27/2023   Hair loss 08/05/2022   Brittle nails 08/05/2022   Low vitamin B12 level 08/05/2022   Change in voice 06/08/2022   Estrogen deficiency 09/07/2020   Laryngopharyngeal reflux (LPR) 01/08/2017   Pharyngeal dysphagia 01/08/2017   Prediabetes 08/25/2016   Routine general medical examination at a health care facility 08/19/2016   Stress reaction 09/17/2015   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   Rosacea 12/27/2014   PVCs (premature ventricular contractions) 09/26/2014   Left knee pain 08/18/2014   Fatigue 05/31/2012   Fibromyalgia 05/31/2012   GERD (gastroesophageal reflux disease) 07/08/2011   Brachial neuritis or radiculitis 09/04/2010   PULMONARY NODULE 09/14/2009   PALPITATIONS 08/17/2009   Vitamin D  deficiency 02/22/2009   HYPERCHOLESTEROLEMIA 02/22/2009   Adjustment disorder with mixed anxiety and depressed mood 02/22/2009   Allergic rhinitis 02/22/2009   MENOPAUSAL SYNDROME 02/22/2009   Osteopenia 02/22/2009   Past Medical History:  Diagnosis Date   Allergy     all year   Anxiety    Bursitis of hip    Cataract    Cervical cancer (HCC) 1978   Chronic kidney disease    RENAL INSUFF   Depression    Dysrhythmia    PVC'S   Fibromyalgia    GERD (gastroesophageal reflux disease)    High cholesterol    History of Clostridium difficile infection    In past from augmentin   History of hiatal hernia    Osteopenia    Palpitations    Panic disorder    Pulmonary HTN (HCC) 10/20/2014   moderate with PASP   Pulmonary nodule    Being observed   Vitamin D  deficiency     Family History  Problem  Relation Age of Onset   Heart attack Mother 23   Emphysema Mother    Allergies Mother    Asthma Mother    Allergies Father    Glaucoma Father    Arrhythmia Sister        Atrial fibrillation   Cancer Sister        breast   Breast cancer Sister    Emphysema Sister    Heart disease Sister    Rheumatologic disease Sister    Lung cancer Sister    Diabetes Other        Parent   Hyperlipidemia Other        Other relative   Hypertension Other  Parent, other relative   Breast cancer Other        Other relative, sister   Allergies Other        Siblings   Asthma Other        Sisters   Colon cancer Neg Hx    Esophageal cancer Neg Hx    Rectal cancer Neg Hx    Stomach cancer Neg Hx     Past Surgical History:  Procedure Laterality Date   ABDOMINAL HYSTERECTOMY  1978   CARDIAC CATHETERIZATION     CATARACT EXTRACTION W/PHACO Right 07/14/2017   Procedure: CATARACT EXTRACTION PHACO AND INTRAOCULAR LENS PLACEMENT (IOC);  Surgeon: Clair Crews, MD;  Location: [facility] ORS;  Service: Ophthalmology;  Laterality: Right;  US  00:31 AP% 15.7 CDE 4.97 Fluid pack lot # [id number]   CATARACT EXTRACTION W/PHACO Left 08/04/2017   Procedure: CATARACT EXTRACTION PHACO AND INTRAOCULAR LENS PLACEMENT (IOC);  Surgeon: Clair Crews, MD;  Location: [facility] OR[facility];  Service: Ophthalmology;  Laterality: Left;  US  00:34 AP% 18.7 CDE 6.42 Fluid pack lot # [id number]   COLONOSCOPY  2002   Normal   HAND SURGERY  2005   Left hand thrombosis   NASAL HEMORRHAGE CONTROL  2007   RIGHT HEART CATHETERIZATION N/A 03/16/2015   Procedure: RIGHT HEART CATH;  Surgeon: Darlis Eisenmenger, MD;  Location: [facility] CATH LAB;  Service: Cardiovascular;  Laterality: N/A;   SHOULDER SURGERY  2008   Right shoulder bone spur   THYROGLOSSAL DUCT CYST     Social History   Occupational History   Occupation: Retired from book keeping  Tobacco Use   Smoking status: Former    Current packs/day: 0.00    Average packs/day: 1 pack/day  for 17.0 years (17.0 ttl pk-yrs)    Types: Cigarettes    Start date: 12/08/1961    Quit date: 12/08/1978    Years since quitting: 45.4   Smokeless tobacco: Never  Vaping Use   Vaping status: Never Used  Substance and Sexual Activity   Alcohol use: No    Alcohol/week: 0.0 standard drinks of alcohol   Drug use: No   Sexual activity: Yes    Partners: Male

## 2024-05-05 NOTE — Addendum Note (Signed)
 Addended by: Deri Fleet A on: 05/05/2024 09:31 AM   Modules accepted: Orders

## 2024-05-13 NOTE — Telephone Encounter (Signed)
 Thank you :)

## 2024-05-16 ENCOUNTER — Ambulatory Visit: Admitting: Physician Assistant

## 2024-05-16 DIAGNOSIS — M1712 Unilateral primary osteoarthritis, left knee: Secondary | ICD-10-CM

## 2024-05-16 DIAGNOSIS — M25562 Pain in left knee: Secondary | ICD-10-CM

## 2024-05-16 DIAGNOSIS — G8929 Other chronic pain: Secondary | ICD-10-CM | POA: Diagnosis not present

## 2024-05-16 MED ORDER — HYALURONAN 88 MG/4ML IX SOSY
88.0000 mg | PREFILLED_SYRINGE | INTRA_ARTICULAR | Status: AC | PRN
  Administered 2024-05-16: 88 mg via INTRA_ARTICULAR

## 2024-05-16 NOTE — Progress Notes (Signed)
 Office Visit Note   Patient: Mary Gordon           Date of Birth: October 05, 1946           MRN: [id number] Visit Date: 05/16/2024              Requested by: [name], Manley Seeds, MD [address] [place],  [place] [address] PCP: Clemens Curt, MD  Osteoarthritis left knee    HPI: Patient is a 78 year old woman who comes in today for Monovisc injection into her left knee.  Assessment & Plan: Visit Diagnoses:  1. Chronic pain of left knee     Plan: Side effects of medication were discussed with her went forward with Monovisc injection into her left knee may follow-up as needed  Follow-Up Instructions: No follow-ups on file.   Ortho Exam  Patient is alert, oriented, no adenopathy, well-dressed, normal affect, normal respiratory effort. Left knee no effusion neurovascular intact no erythema compartments are soft and compressible    Imaging: No results found. No images are attached to the encounter.  Labs: Lab Results  Component Value Date   HGBA1C 5.6 01/08/2024   HGBA1C 5.5 12/11/2022   HGBA1C 5.6 10/09/2021   ESRSEDRATE 16 05/02/2021   ESRSEDRATE 14 10/15/2012   ESRSEDRATE 12 05/31/2012   CRP <1.0 05/02/2021   CRP <0.5 10/15/2012   LABORGA ESCHERICHIA COLI 10/08/2015     Lab Results  Component Value Date   ALBUMIN 4.0 01/08/2024   ALBUMIN 4.2 12/11/2022   ALBUMIN 4.1 08/05/2022    No results found for: "MG" Lab Results  Component Value Date   VD25OH 49.24 01/08/2024   VD25OH 64.66 12/11/2022   VD25OH 100.66 (H) 10/09/2021    No results found for: "PREALBUMIN"    Latest Ref Rng & Units 01/08/2024    8:14 AM 12/12/2022    3:27 PM 08/05/2022   12:31 PM  CBC EXTENDED  WBC 4.0 - 10.5 K/uL 5.8  6.0  6.6   RBC 3.87 - 5.11 Mil/uL 4.55  4.49  4.39   Hemoglobin 12.0 - 15.0 g/dL 32.4  40.1  02.7   HCT 36.0 - 46.0 % 39.5  37.9  37.4   Platelets 150.0 - 400.0 K/uL 284.0  278  303.0   NEUT# 1.4 - 7.7 K/uL 3.6  3,378  4.3   Lymph# 0.7 - 4.0 K/uL 1.6   1,992  1.6      There is no height or weight on file to calculate BMI.  Orders:  No orders of the defined types were placed in this encounter.  No orders of the defined types were placed in this encounter.    Procedures: Large Joint Inj: L knee on 05/16/2024 3:58 PM Indications: pain and diagnostic evaluation Details: 1.5 in anteromedial approach  Arthrogram: No  Medications: 88 mg Hyaluronan 88 MG/4ML Outcome: tolerated well, no immediate complications Procedure, treatment alternatives, risks and benefits explained, specific risks discussed. Consent was given by the patient. Immediately prior to procedure a time out was called to verify the correct patient, procedure, equipment, support staff and site/side marked as required. Patient was prepped and draped in the usual sterile fashion.    Clinical Data: No additional findings.  ROS:  All other systems negative, except as noted in the HPI. Review of Systems  Objective: Vital Signs: There were no vitals taken for this visit.  Specialty Comments:  No specialty comments available.  PMFS History: Patient Active Problem List   Diagnosis Date  Noted  . Unilateral primary osteoarthritis, left knee 05/05/2024  . Vitamin B12 deficiency 02/24/2024  . Allergic reaction 11/18/2023  . Chronic cough 07/29/2023  . Constipation 04/27/2023  . Hair loss 08/05/2022  . Brittle nails 08/05/2022  . Low vitamin B12 level 08/05/2022  . Change in voice 06/08/2022  . Estrogen deficiency 09/07/2020  . Laryngopharyngeal reflux (LPR) 01/08/2017  . Pharyngeal dysphagia 01/08/2017  . Prediabetes 08/25/2016  . Routine general medical examination at a health care facility 08/19/2016  . Stress reaction 09/17/2015  . 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 Rosacea 12/27/2014  . PVCs (premature ventricular contractions) 09/26/2014  . Left knee pain 08/18/2014  . Fatigue 05/31/2012  . Fibromyalgia 05/31/2012  . GERD (gastroesophageal reflux  disease) 07/08/2011  . Brachial neuritis or radiculitis 09/04/2010  . PULMONARY NODULE 09/14/2009  . PALPITATIONS 08/17/2009  . Vitamin D  deficiency 02/22/2009  . HYPERCHOLESTEROLEMIA 02/22/2009  . Adjustment disorder with mixed anxiety and depressed mood 02/22/2009  . Allergic rhinitis 02/22/2009  . MENOPAUSAL SYNDROME 02/22/2009  . Osteopenia 02/22/2009   Past Medical History:  Diagnosis Date  . Allergy     all year  . Anxiety   . Bursitis of hip   . Cataract   . Cervical cancer (HCC) 1978  . Chronic kidney disease    RENAL INSUFF  . Depression   . Dysrhythmia    PVC'S  . Fibromyalgia   . GERD (gastroesophageal reflux disease)   . High cholesterol   . History of Clostridium difficile infection    In past from augmentin  . History of hiatal hernia   . Osteopenia   . Palpitations   . Panic disorder   . Pulmonary HTN (HCC) 10/20/2014   moderate with PASP  . Pulmonary nodule    Being observed  . Vitamin D  deficiency     Family History  Problem Relation Age of Onset  . Heart attack Mother 38  . Emphysema Mother   . Allergies Mother   . Asthma Mother   . Allergies Father   . Glaucoma Father   . Arrhythmia Sister        Atrial fibrillation  . Cancer Sister        breast  . Breast cancer Sister   . Emphysema Sister   . Heart disease Sister   . Rheumatologic disease Sister   . Lung cancer Sister   . Diabetes Other        Parent  . Hyperlipidemia Other        Other relative  . Hypertension Other        Parent, other relative  . Breast cancer Other        Other relative, sister  . Allergies Other        Siblings  . Asthma Other        Sisters  . Colon cancer Neg Hx   . Esophageal cancer Neg Hx   . Rectal cancer Neg Hx   . Stomach cancer Neg Hx     Past Surgical History:  Procedure Laterality Date  . ABDOMINAL HYSTERECTOMY  1978  . CARDIAC CATHETERIZATION    . CATARACT EXTRACTION W/PHACO Right 07/14/2017   Procedure: CATARACT EXTRACTION PHACO  AND INTRAOCULAR LENS PLACEMENT (IOC);  Surgeon: Clair Crews, MD;  Location: [facility] ORS;  Service: Ophthalmology;  Laterality: Right;  US  00:31 AP% 15.7 CDE 4.97 Fluid pack lot # [id number]  . CATARACT EXTRACTION W/PHACO Left 08/04/2017   Procedure: CATARACT EXTRACTION PHACO AND INTRAOCULAR LENS PLACEMENT (  IOC);  Surgeon: Clair Crews, MD;  Location: [facility] ORS;  Service: Ophthalmology;  Laterality: Left;  US  00:34 AP% 18.7 CDE 6.42 Fluid pack lot # [id number]  . COLONOSCOPY  2002   Normal  . HAND SURGERY  2005   Left hand thrombosis  . NASAL HEMORRHAGE CONTROL  2007  . RIGHT HEART CATHETERIZATION N/A 03/16/2015   Procedure: RIGHT HEART CATH;  Surgeon: Darlis Eisenmenger, MD;  Location: [facility] CATH LAB;  Service: Cardiovascular;  Laterality: N/A;  . SHOULDER SURGERY  2008   Right shoulder bone spur  . THYROGLOSSAL DUCT CYST     Social History   Occupational History  . Occupation: Retired from book keeping  Tobacco Use  . Smoking status: Former    Current packs/day: 0.00    Average packs/day: 1 pack/day for 17.0 years (17.0 ttl pk-yrs)    Types: Cigarettes    Start date: 12/08/1961    Quit date: 12/08/1978    Years since quitting: 45.4  . Smokeless tobacco: Never  Vaping Use  . Vaping status: Never Used  Substance and Sexual Activity  . Alcohol use: No    Alcohol/week: 0.0 standard drinks of alcohol  . Drug use: No  . Sexual activity: Yes    Partners: Male

## 2024-06-01 ENCOUNTER — Ambulatory Visit (INDEPENDENT_AMBULATORY_CARE_PROVIDER_SITE_OTHER): Admitting: Clinical

## 2024-06-01 DIAGNOSIS — F439 Reaction to severe stress, unspecified: Secondary | ICD-10-CM

## 2024-06-01 DIAGNOSIS — F4323 Adjustment disorder with mixed anxiety and depressed mood: Secondary | ICD-10-CM
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
## 2024-06-01 NOTE — Progress Notes (Signed)
   Doree Barthel, LCSW

## 2024-06-06 ENCOUNTER — Encounter: Payer: Self-pay | Admitting: Physician Assistant

## 2024-06-06 ENCOUNTER — Ambulatory Visit: Admitting: Physician Assistant

## 2024-06-06 DIAGNOSIS — M1712 Unilateral primary osteoarthritis, left knee: Secondary | ICD-10-CM

## 2024-06-06 NOTE — Progress Notes (Signed)
 Office Visit Note   Patient: Mary Gordon           Date of Birth: July 28, 1946           MRN: [id number] Visit Date: 06/06/2024              Requested by: [name], Laine LABOR, MD [address] [place],  [place] [address] PCP: Randeen Laine LABOR, MD      HPI: Patient is 1 month status post Monovisc injection into her left knee.  She said it took a while for the injection to be helpful but she noticed in the last couple days her knee is feeling better  Assessment & Plan: Visit Diagnoses:  1. Unilateral primary osteoarthritis, left knee     Plan: Emphasize the importance of keeping her knee strong she is also doing some in for read thinks that is helpful.  If she had pain in between injections come in for steroid injection if she wishes to get another injection after 6 months she should contact us  first  Follow-Up Instructions: Return if symptoms worsen or fail to improve.   Ortho Exam  Patient is alert, oriented, no adenopathy, well-dressed, normal affect, normal respiratory effort. Left knee no effusion no erythema compartments are soft and compressible she is neurovascular intact    Imaging: No results found. No images are attached to the encounter.  Labs: Lab Results  Component Value Date   HGBA1C 5.6 01/08/2024   HGBA1C 5.5 12/11/2022   HGBA1C 5.6 10/09/2021   ESRSEDRATE 16 05/02/2021   ESRSEDRATE 14 10/15/2012   ESRSEDRATE 12 05/31/2012   CRP <1.0 05/02/2021   CRP <0.5 10/15/2012   LABORGA ESCHERICHIA COLI 10/08/2015     Lab Results  Component Value Date   ALBUMIN 4.0 01/08/2024   ALBUMIN 4.2 12/11/2022   ALBUMIN 4.1 08/05/2022    No results found for: MG Lab Results  Component Value Date   VD25OH 49.24 01/08/2024   VD25OH 64.66 12/11/2022   VD25OH 100.66 (H) 10/09/2021    No results found for: PREALBUMIN    Latest Ref Rng & Units 01/08/2024    8:14 AM 12/12/2022    3:27 PM 08/05/2022   12:31 PM  CBC EXTENDED  WBC 4.0 - 10.5 K/uL 5.8   6.0  6.6   RBC 3.87 - 5.11 Mil/uL 4.55  4.49  4.39   Hemoglobin 12.0 - 15.0 g/dL 87.1  87.5  87.5   HCT 36.0 - 46.0 % 39.5  37.9  37.4   Platelets 150.0 - 400.0 K/uL 284.0  278  303.0   NEUT# 1.4 - 7.7 K/uL 3.6  3,378  4.3   Lymph# 0.7 - 4.0 K/uL 1.6  1,992  1.6      There is no height or weight on file to calculate BMI.  Orders:  No orders of the defined types were placed in this encounter.  No orders of the defined types were placed in this encounter.    Procedures: No procedures performed  Clinical Data: No additional findings.  ROS:  All other systems negative, except as noted in the HPI. Review of Systems  Objective: Vital Signs: There were no vitals taken for this visit.  Specialty Comments:  No specialty comments available.  PMFS History: Patient Active Problem List   Diagnosis Date Noted   Unilateral primary osteoarthritis, left knee 05/05/2024   Vitamin B12 deficiency 02/24/2024   Allergic reaction 11/18/2023   Chronic cough 07/29/2023   Constipation 04/27/2023  Hair loss 08/05/2022   Brittle nails 08/05/2022   Low vitamin B12 level 08/05/2022   Change in voice 06/08/2022   Estrogen deficiency 09/07/2020   Laryngopharyngeal reflux (LPR) 01/08/2017   Pharyngeal dysphagia 01/08/2017   Prediabetes 08/25/2016   Routine general medical examination at a health care facility 08/19/2016   Stress reaction 09/17/2015   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   Rosacea 12/27/2014   PVCs (premature ventricular contractions) 09/26/2014   Left knee pain 08/18/2014   Fatigue 05/31/2012   Fibromyalgia 05/31/2012   GERD (gastroesophageal reflux disease) 07/08/2011   Brachial neuritis or radiculitis 09/04/2010   PULMONARY NODULE 09/14/2009   PALPITATIONS 08/17/2009   Vitamin D  deficiency 02/22/2009   HYPERCHOLESTEROLEMIA 02/22/2009   Adjustment disorder with mixed anxiety and depressed mood 02/22/2009   Allergic rhinitis 02/22/2009   MENOPAUSAL SYNDROME 02/22/2009    Osteopenia 02/22/2009   Past Medical History:  Diagnosis Date   Allergy     all year   Anxiety    Bursitis of hip    Cataract    Cervical cancer (HCC) 1978   Chronic kidney disease    RENAL INSUFF   Depression    Dysrhythmia    PVC'S   Fibromyalgia    GERD (gastroesophageal reflux disease)    High cholesterol    History of Clostridium difficile infection    In past from augmentin   History of hiatal hernia    Osteopenia    Palpitations    Panic disorder    Pulmonary HTN (HCC) 10/20/2014   moderate with PASP   Pulmonary nodule    Being observed   Vitamin D  deficiency     Family History  Problem Relation Age of Onset   Heart attack Mother 86   Emphysema Mother    Allergies Mother    Asthma Mother    Allergies Father    Glaucoma Father    Arrhythmia Sister        Atrial fibrillation   Cancer Sister        breast   Breast cancer Sister    Emphysema Sister    Heart disease Sister    Rheumatologic disease Sister    Lung cancer Sister    Diabetes Other        Parent   Hyperlipidemia Other        Other relative   Hypertension Other        Parent, other relative   Breast cancer Other        Other relative, sister   Allergies Other        Siblings   Asthma Other        Sisters   Colon cancer Neg Hx    Esophageal cancer Neg Hx    Rectal cancer Neg Hx    Stomach cancer Neg Hx     Past Surgical History:  Procedure Laterality Date   ABDOMINAL HYSTERECTOMY  1978   CARDIAC CATHETERIZATION     CATARACT EXTRACTION W/PHACO Right 07/14/2017   Procedure: CATARACT EXTRACTION PHACO AND INTRAOCULAR LENS PLACEMENT (IOC);  Surgeon: Jaye Fallow, MD;  Location: [facility] ORS;  Service: Ophthalmology;  Laterality: Right;  US  00:31 AP% 15.7 CDE 4.97 Fluid pack lot # [id number]   CATARACT EXTRACTION W/PHACO Left 08/04/2017   Procedure: CATARACT EXTRACTION PHACO AND INTRAOCULAR LENS PLACEMENT (IOC);  Surgeon: Jaye Fallow, MD;  Location: [facility] ORS;  Service:  Ophthalmology;  Laterality: Left;  US  00:34 AP% 18.7 CDE 6.42 Fluid pack lot # [id number]   COLONOSCOPY  2002   Normal   HAND SURGERY  2005   Left hand thrombosis   NASAL HEMORRHAGE CONTROL  2007   RIGHT HEART CATHETERIZATION N/A 03/16/2015   Procedure: RIGHT HEART CATH;  Surgeon: Ezra GORMAN Shuck, MD;  Location: [facility] CATH LAB;  Service: Cardiovascular;  Laterality: N/A;   SHOULDER SURGERY  2008   Right shoulder bone spur   THYROGLOSSAL DUCT CYST     Social History   Occupational History   Occupation: Retired from book keeping  Tobacco Use   Smoking status: Former    Current packs/day: 0.00    Average packs/day: 1 pack/day for 17.0 years (17.0 ttl pk-yrs)    Types: Cigarettes    Start date: 12/08/1961    Quit date: 12/08/1978    Years since quitting: 45.5   Smokeless tobacco: Never  Vaping Use   Vaping status: Never Used  Substance and Sexual Activity   Alcohol use: No    Alcohol/week: 0.0 standard drinks of alcohol   Drug use: No   Sexual activity: Yes    Partners: Male

## 2024-07-14 ENCOUNTER — Ambulatory Visit (INDEPENDENT_AMBULATORY_CARE_PROVIDER_SITE_OTHER): Admitting: Professional

## 2024-07-14 ENCOUNTER — Encounter: Payer: Self-pay | Admitting: Professional

## 2024-07-14 DIAGNOSIS — F4323 Adjustment disorder with mixed anxiety and depressed mood: Secondary | ICD-10-CM | POA: Diagnosis not present

## 2024-07-14 DIAGNOSIS — F430 Acute stress reaction: Secondary | ICD-10-CM | POA: Diagnosis not present
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
## 2024-07-14 NOTE — Progress Notes (Signed)
   [name], [facility]

## 2024-07-28 ENCOUNTER — Other Ambulatory Visit: Payer: Self-pay | Admitting: Family Medicine

## 2024-07-28 DIAGNOSIS — Z1231 Encounter for screening mammogram for malignant neoplasm of breast: Secondary | ICD-10-CM

## 2024-08-04 ENCOUNTER — Encounter: Payer: Self-pay | Admitting: Professional

## 2024-08-04 ENCOUNTER — Ambulatory Visit (INDEPENDENT_AMBULATORY_CARE_PROVIDER_SITE_OTHER): Admitting: Professional

## 2024-08-04 DIAGNOSIS — F4323 Adjustment disorder with mixed anxiety and depressed mood: Secondary | ICD-10-CM

## 2024-08-04 DIAGNOSIS — F430 Acute stress reaction: Secondary | ICD-10-CM | POA: Diagnosis not present

## 2024-08-04 NOTE — Progress Notes (Signed)
   Mary Gordon, [facility]

## 2024-08-04 NOTE — Progress Notes (Signed)
 [facility] Counselor/Therapist Progress Note  Patient ID: Mary Gordon, MRN: [id number],    Date: 08/04/2024  Time Spent: 47 minutes 206-253pm   Treatment Type: Individual Therapy  Risk Assessment: Danger to Self:  No Self-injurious Behavior: No Danger to Others: No  Subjective: The patient arrived late for her inperson session.   Issues addressed: 1-personal -feels greatly improved and doesn't think she needs ongoing therapy -pt set limits with her spouse and has followed through -her spouse has responded well to her limits and has cut his hair and has gotten a job 2-treatment planning a-the patient would like to work on the following in therapy: b-pt and Clinician completed development of treatment plan during session -pt fully participated and is in agreement with her plan  Problems: Intimate Relationship Conflicts Symptoms: Lack of communication with the partner. A pattern of angry projection of responsibility for the conflicts onto the partner. A pattern of superficial or no communication, infrequent or no sexual contact, excessive involvement in activities (work or recreation) that allows for avoidance of closeness to the partner. Goals: Increase awareness of own role in the relationship conflicts. Develop the necessary skills for effective, open communication, mutually satisfying sexual intimacy, and enjoyable time for companionship within the relationship. Objectives target date for all objectives is 08/04/2025: Increase time spent in enjoyable contact with the partner. Understand the origin of each other's negative emotions and reactions and develop more constructive interactions that fill needs. Accept partner's existing characteristics that are unlikely to change but do not jeopardize the relationship. Learn and implement cognitive therapy techniques to replace unrealistic, maladaptive thoughts, feelings, and actions with those facilitative of  the relationship. Learn and implement problem-solving and conflict resolution skills. Increase the frequency of the direct expression of honest, respectful, and positive feelings and thoughts within the relationship. Make a commitment to change specific behaviors that have been identified by self or the partner. Interventions: Assist the clients in developing more constructive interactions that satisfy attachment needs such as increased intimacy and expressions of love (or assign How Can We Meet Each Other's Needs and Desires? in the Adult Psychotherapy Homework Planner by Jenniffer). Assist the couple in identifying and planning rewarding social/recreational activities that can be shared with the partner (or assign Identify and Schedule Pleasant Activities in the Adult Psychotherapy Homework Planner by Jenniffer). Process the list of positive and problematic features of each partner and the relationship; ask couple to agree to work on changes he/she needs to make to improve the relationship, generating a list of targeted changes (or assign How Can We Meet Each Other's Needs and Desires? in the Adult Psychotherapy Homework Planner by Jenniffer). Assist the couple in identifying conflicts that can be addressed using communication, conflict-resolution, and/or problem-solving skills (see Behavioral Marital Therapy by Holtzworth-Munroe and Jacobson). Use behavioral techniques (education, modeling, role-playing, corrective feedback, and positive reinforcement) to teach communication skills including assertive communication, offering positive feedback, active listening, making positive requests of others for behavior change, and giving negative feedback in an honest and respectful manner. Help the couple build tolerance of each other's differences by seeing the positive side of such differences to balance their awareness of drawbacks (see Integrative Couple Therapy by Veatrice armin Ni). Use cognitive  therapy techniques to restructure the clients' biased cognitions (e.g., mind-reading, blaming), modify maladaptive emotional responses (e.g., rage) and inappropriate behaviors (e.g., verbal aggression) within the relationship (see Enhanced Cognitive Behavioral Therapy for Couples by Elbert and Baucom) Use behavioral techniques (education, modeling, role-playing, corrective feedback, and  positive reinforcement) to teach the couple problem-solving and conflict resolution skills including defining the problem constructively and specifically, brainstorming options, evaluating options, compromise, choosing options and implementing a plan, evaluating the results. Review how newly learned communication skills can be applied to conflict resolution through calm, respectful, effective dialogue; role-play application of this skill to a present conflict situation.   Diagnosis:Adjustment disorder with mixed anxiety and depressed mood  Stress reaction  Plan:  -pt will call as needed; she does not feel she needs to continue therapy at this time.

## 2024-08-12 ENCOUNTER — Other Ambulatory Visit (INDEPENDENT_AMBULATORY_CARE_PROVIDER_SITE_OTHER): Payer: Self-pay | Admitting: Otolaryngology

## 2024-08-22 ENCOUNTER — Ambulatory Visit
Admission: RE | Admit: 2024-08-22 | Discharge: 2024-08-22 | Disposition: A | Discharge status: Home / Self Care | Source: Ambulatory Visit | Attending: Family Medicine | Admitting: Family Medicine

## 2024-08-22 DIAGNOSIS — Z1231 Encounter for screening mammogram for malignant neoplasm of breast: Secondary | ICD-10-CM | POA: Diagnosis not present

## 2024-08-24 ENCOUNTER — Telehealth: Payer: Self-pay | Admitting: *Deleted

## 2024-08-24 ENCOUNTER — Ambulatory Visit: Payer: Self-pay | Admitting: Family Medicine

## 2024-08-24 NOTE — Telephone Encounter (Signed)
 Please schedule fasting lab appt prior to CPE, thanks

## 2024-08-24 NOTE — Telephone Encounter (Signed)
 Copied from CRM [id number]. Topic: Clinical - Request for Lab/Test Order >> Aug 24, 2024  4:51 PM Mary Gordon wrote: Reason for CRM: Patient has a med check/general check up scheduled for next Thursday 09/25. Wanting to know if she can come in prior for lab work, please advise # [phone]

## 2024-08-25 ENCOUNTER — Ambulatory Visit: Admitting: Professional

## 2024-08-25 NOTE — Telephone Encounter (Signed)
 Pt called and schedule appt

## 2024-08-29 DIAGNOSIS — Z9889 Other specified postprocedural states: Secondary | ICD-10-CM | POA: Diagnosis not present

## 2024-08-29 DIAGNOSIS — H43813 Vitreous degeneration, bilateral: Secondary | ICD-10-CM | POA: Diagnosis not present

## 2024-08-29 DIAGNOSIS — M3501 Sicca syndrome with keratoconjunctivitis: Secondary | ICD-10-CM | POA: Diagnosis not present

## 2024-09-01 ENCOUNTER — Encounter: Payer: Self-pay | Admitting: Family Medicine

## 2024-09-01 ENCOUNTER — Ambulatory Visit (INDEPENDENT_AMBULATORY_CARE_PROVIDER_SITE_OTHER): Admitting: Family Medicine

## 2024-09-01 ENCOUNTER — Ambulatory Visit: Admitting: Professional

## 2024-09-01 VITALS — BP 124/75 | HR 79 | Temp 98.4°F | Ht 64.0 in | Wt 172.1 lb

## 2024-09-01 DIAGNOSIS — F4323 Adjustment disorder with mixed anxiety and depressed mood: Secondary | ICD-10-CM | POA: Diagnosis not present

## 2024-09-01 DIAGNOSIS — M1712 Unilateral primary osteoarthritis, left knee: Secondary | ICD-10-CM

## 2024-09-01 DIAGNOSIS — E039 Hypothyroidism, unspecified: Secondary | ICD-10-CM | POA: Insufficient documentation

## 2024-09-01 DIAGNOSIS — R232 Flushing: Secondary | ICD-10-CM | POA: Diagnosis not present

## 2024-09-01 DIAGNOSIS — Z79890 Hormone replacement therapy (postmenopausal): Secondary | ICD-10-CM | POA: Insufficient documentation

## 2024-09-01 DIAGNOSIS — E559 Vitamin D deficiency, unspecified: Secondary | ICD-10-CM | POA: Diagnosis not present

## 2024-09-01 MED ORDER — SERTRALINE HCL 50 MG PO TABS: 50.0000 mg | ORAL_TABLET | Freq: Every day | ORAL | 3 refills | Status: AC

## 2024-09-01 NOTE — Assessment & Plan Note (Signed)
 Pt has been taking HRT from [facility] and cannot afford to continue going there  Progesterone  100 mg bid  Compounded topical testosterone   Not on estrogen   Thinks one or both of these helps hot flashes and well being   Sent for records/labs from [facility] to review   Also ref to gyn

## 2024-09-01 NOTE — Assessment & Plan Note (Signed)
 Has had synervisc injections in left knee No longer working  ? If candidate for TKA at this time Has not done PT  Unable to play pickleball  Reviewed last ortho notes today (can see xray picture not report) Recommend follow up there for next steps  Bike or water exercise may be helpful to keep strong/support knee

## 2024-09-01 NOTE — Assessment & Plan Note (Addendum)
 Pt has gone to [facility] in [place] and gets testosterone  compouned product  Also progesterone  100 mg bid   She can no longer go there   Interested in options for menopausal symptoms  Referral made to gyn  Sent for records and labs from [facility]

## 2024-09-01 NOTE — Progress Notes (Signed)
 Subjective:    Patient ID: Mary Gordon, female    DOB: 02-01-1946, 78 y.o.   MRN: [id number]  HPI  Wt Readings from Last 3 Encounters:  09/01/24 172 lb 2 oz (78.1 kg)  04/29/24 161 lb 6 oz (73.2 kg)  03/30/24 165 lb 6 oz (75 kg)   29.55 kg/m  Vitals:   09/01/24 1119 09/01/24 1201  BP: 136/82 124/75  Pulse: 79   Temp: 98.4 F (36.9 C)   SpO2: 96%     Pt presents for follow up of  Chronic medical problems  Blood pressure higher than usual for her  Hormone issues /hot flashes  Left knee pain /OA  Has to stop going [facility]  Not covered by her plan any more    Liothyronine 5 mcg (T3)  Had abnormal thyroid  test   Here  Lab Results  Component Value Date   TSH 2.14 01/08/2024  Free T4 was 06/2015 at 0.71   Progesterone  100 mg  bid  Testosterone  - compounded from [facility]  This helps hot flashes Not on any estrogen    Dr JINNY Shell - [facility]    Sister had breast cancer (rare type)      Mood     02/24/2024    8:33 AM 10/28/2023   11:00 AM 04/27/2023   12:42 PM 10/24/2022    2:10 PM 10/09/2021    8:26 AM  Depression screen PHQ 2/9  Decreased Interest 3 0 0 0 0  Down, Depressed, Hopeless 3 1 0 0 0  PHQ - 2 Score 6 1 0 0 0  Altered sleeping 0  1    Tired, decreased energy 3  1    Change in appetite 3  0    Feeling bad or failure about yourself  2  0    Trouble concentrating 3  0    Moving slowly or fidgety/restless 0  0    Suicidal thoughts 0  0    PHQ-9 Score 17  2    Difficult doing work/chores Very difficult  Not difficult at all        02/24/2024    8:33 AM 04/27/2023   12:42 PM  GAD 7 : Generalized Anxiety Score  Nervous, Anxious, on Edge 3 0  Control/stop worrying 3 0  Worry too much - different things 3 0  Trouble relaxing 0 0  Restless 3 0  Easily annoyed or irritable 1 0  Afraid - awful might happen 1 0  Total GAD 7 Score 14 0  Anxiety Difficulty Very difficult     Sertraline  not effective in  past-but she started back on it 50 mg daily  Was off of everything and got jittery  Trazodone  for sleep      Fluoxetine  helped but caused brain fog and not tolerated  Last visit offered counseling referral   Stress  Issues with husband are improved  Had to re home the dog     Has seen Nathanel Collet for counseling  Had good experience Does not think she needs to return   Had a gel shot in left knee  [facility]  Is getting worse  Cannot play pickleball      Patient Active Problem List   Diagnosis Date Noted   Hypothyroid 09/01/2024   Hot flashes 09/01/2024   Hormone replacement therapy 09/01/2024   Unilateral primary osteoarthritis, left knee 05/05/2024   Vitamin B12 deficiency 02/24/2024   Allergic reaction 11/18/2023  Chronic cough 07/29/2023   Constipation 04/27/2023   Hair loss 08/05/2022   Brittle nails 08/05/2022   Low vitamin B12 level 08/05/2022   Change in voice 06/08/2022   Estrogen deficiency 09/07/2020   Laryngopharyngeal reflux (LPR) 01/08/2017   Pharyngeal dysphagia 01/08/2017   Prediabetes 08/25/2016   Routine general medical examination at a health care facility 08/19/2016   Stress reaction 09/17/2015   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   Rosacea 12/27/2014   PVCs (premature ventricular contractions) 09/26/2014   Left knee pain 08/18/2014   Fatigue 05/31/2012   Fibromyalgia 05/31/2012   GERD (gastroesophageal reflux disease) 07/08/2011   Brachial neuritis or radiculitis 09/04/2010   PULMONARY NODULE 09/14/2009   PALPITATIONS 08/17/2009   Vitamin D  deficiency 02/22/2009   HYPERCHOLESTEROLEMIA 02/22/2009   Adjustment disorder with mixed anxiety and depressed mood 02/22/2009   Allergic rhinitis 02/22/2009   MENOPAUSAL SYNDROME 02/22/2009   Osteopenia 02/22/2009   Past Medical History:  Diagnosis Date   Allergy     all year   Anxiety    Bursitis of hip    Cataract    Cervical cancer (HCC) 1978   Chronic kidney disease    RENAL  INSUFF   Depression    Dysrhythmia    PVC'S   Fibromyalgia    GERD (gastroesophageal reflux disease)    High cholesterol    History of Clostridium difficile infection    In past from augmentin   History of hiatal hernia    Osteopenia    Palpitations    Panic disorder    Pulmonary HTN (HCC) 10/20/2014   moderate with PASP   Pulmonary nodule    Being observed   Vitamin D  deficiency    Past Surgical History:  Procedure Laterality Date   ABDOMINAL HYSTERECTOMY  1978   CARDIAC CATHETERIZATION     CATARACT EXTRACTION W/PHACO Right 07/14/2017   Procedure: CATARACT EXTRACTION PHACO AND INTRAOCULAR LENS PLACEMENT (IOC);  Surgeon: Jaye Fallow, MD;  Location: [facility] ORS;  Service: Ophthalmology;  Laterality: Right;  US  00:31 AP% 15.7 CDE 4.97 Fluid pack lot # [id number]   CATARACT EXTRACTION W/PHACO Left 08/04/2017   Procedure: CATARACT EXTRACTION PHACO AND INTRAOCULAR LENS PLACEMENT (IOC);  Surgeon: Jaye Fallow, MD;  Location: [facility] OR[facility];  Service: Ophthalmology;  Laterality: Left;  US  00:34 AP% 18.7 CDE 6.42 Fluid pack lot # [id number]   COLONOSCOPY  2002   Normal   HAND SURGERY  2005   Left hand thrombosis   NASAL HEMORRHAGE CONTROL  2007   RIGHT HEART CATHETERIZATION N/A 03/16/2015   Procedure: RIGHT HEART CATH;  Surgeon: Ezra GORMAN Shuck, MD;  Location: [facility] CATH LAB;  Service: Cardiovascular;  Laterality: N/A;   SHOULDER SURGERY  2008   Right shoulder bone spur   THYROGLOSSAL DUCT CYST     Social History   Tobacco Use   Smoking status: Former    Current packs/day: 0.00    Average packs/day: 1 pack/day for 17.0 years (17.0 ttl pk-yrs)    Types: Cigarettes    Start date: 12/08/1961    Quit date: 12/08/1978    Years since quitting: 45.7   Smokeless tobacco: Never  Vaping Use   Vaping status: Never Used  Substance Use Topics   Alcohol use: No    Alcohol/week: 0.0 standard drinks of alcohol   Drug use: No   Family History  Problem Relation Age of Onset   Heart  attack Mother 38   Emphysema Mother    Allergies Mother    Asthma  Mother    Allergies Father    Glaucoma Father    Arrhythmia Sister        Atrial fibrillation   Cancer Sister        breast   Breast cancer Sister    Emphysema Sister    Heart disease Sister    Rheumatologic disease Sister    Lung cancer Sister    Diabetes Other        Parent   Hyperlipidemia Other        Other relative   Hypertension Other        Parent, other relative   Breast cancer Other        Other relative, sister   Allergies Other        Siblings   Asthma Other        Sisters   Colon cancer Neg Hx    Esophageal cancer Neg Hx    Rectal cancer Neg Hx    Stomach cancer Neg Hx    Allergies  Allergen Reactions   Adhesive [Tape] Rash and Other (See Comments)    Burns the skin   Amoxicillin-Pot Clavulanate Other (See Comments)    Resulted in C-DIFF!!!!   Paxlovid  [Nirmatrelvir -Ritonavir ] Anaphylaxis    Throat swelling    Macrobid  [Nitrofurantoin ] Diarrhea    Severe abdominal cramping   Simvastatin  Other (See Comments)    Caused severe muscle pain   Current Outpatient Medications on File Prior to Visit  Medication Sig Dispense Refill   chlorpheniramine (CHLOR-TRIMETON) 4 MG tablet Take 4 mg by mouth 2 (two) times daily as needed for allergies.     cyanocobalamin  1000 MCG tablet Take 1,000 mcg by mouth daily.     famotidine  (PEPCID ) 20 MG tablet Take 1 tablet (20 mg total) by mouth 2 (two) times daily. (Patient taking differently: Take 20 mg by mouth daily.) 180 tablet 3   liothyronine (CYTOMEL) 5 MCG tablet Take 5 mcg by mouth daily.     NONFORMULARY OR COMPOUNDED ITEM compound testosterone  cream     OVER THE COUNTER MEDICATION Take 5-7 mLs by mouth See admin instructions. Life Tones (uric acid cleanser- has celery extract and other herbs) liquid: Take 5-7 ml's by mouth once a day     pantoprazole  (PROTONIX ) 40 MG tablet Take 1 tablet (40 mg total) by mouth 2 (two) times daily. (Patient taking  differently: Take 40 mg by mouth daily.) 180 tablet 3   progesterone  (PROMETRIUM ) 100 MG capsule Take 100-200 mg by mouth at bedtime.     rosuvastatin  (CRESTOR ) 5 MG tablet TAKE 1 TABLET BY MOUTH TWICE A WEEK ON MONDAY/THURSDAY 24 tablet 3   traZODone  (DESYREL ) 100 MG tablet TAKE 1 TABLET BY MOUTH EVERYDAY AT BEDTIME 90 tablet 3   No current facility-administered medications on file prior to visit.    Review of Systems     Objective:   Physical Exam Constitutional:      General: She is not in acute distress.    Appearance: Normal appearance. She is not ill-appearing or diaphoretic.     Comments: Overweight   Eyes:     Conjunctiva/sclera: Conjunctivae normal.     Pupils: Pupils are equal, round, and reactive to light.  Cardiovascular:     Rate and Rhythm: Normal rate.  Pulmonary:     Effort: Pulmonary effort is normal. No respiratory distress.  Musculoskeletal:     Cervical back: Neck supple.     Comments: Limited rom left knee   Neurological:  Mental Status: She is alert.     Cranial Nerves: No cranial nerve deficit.     Motor: No weakness.     Coordination: Coordination normal.  Psychiatric:        Mood and Affect: Mood normal.     Comments: Candidly discusses symptoms and stressors    Doing well Mood is improved            Assessment & Plan:   Problem List Items Addressed This Visit       Cardiovascular and Mediastinum   Hot flashes   Pt has gone to [facility] clinic in [place] and gets testosterone  compouned product  Also progesterone  100 mg bid   She can no longer go there   Interested in options for menopausal symptoms  Referral made to gyn  Sent for records and labs from [facility]      Relevant Orders   Ambulatory referral to Gynecology     Endocrine   Hypothyroid   Per pt was dx at [facility]  Takes cytomel 5mcg daily and feels better with it   We will send for her thyroid  labs (? What was abn) Here  Lab Results  Component Value  Date   TSH 2.14 01/08/2024  Pt was taking thyroid  supplement at that time    Sent for labs Pt will hold med for now in case we need to repeat labs   Of note does have osteopenia  Plan to follow        Relevant Medications   liothyronine (CYTOMEL) 5 MCG tablet     Musculoskeletal and Integument   Unilateral primary osteoarthritis, left knee   Has had synervisc injections in left knee No longer working  ? If candidate for TKA at this time Has not done PT  Unable to play pickleball  Reviewed last ortho notes today (can see xray picture not report) Recommend follow up there for next steps  Bike or water exercise may be helpful to keep strong/support knee         Other   Vitamin D  deficiency   Hormone replacement therapy - Primary   Pt has been taking [facility] from [facility] and cannot afford to continue going there  Progesterone  100 mg bid  Compounded topical testosterone   Not on estrogen   Thinks one or both of these helps hot flashes and well being   Sent for records/labs from [facility] to review   Also ref to gyn       Relevant Orders   Ambulatory referral to Gynecology   Adjustment disorder with mixed anxiety and depressed mood   Overall doing better Counseling was helpful  Back on sertraline  50 mg daily - helps anxiety and jitteriness Has made some life changes that have helped as well   Cannot exercise as much due to knee pain Encouraged to find other forms of exercise (pedal? Weight training)? Commended self care   Reviewed stressors/ coping techniques/symptoms/ support sources/ tx options and side effects in detail today   Plan to continue sertraline  50 mg  Also trazodone  100 mg for sleep

## 2024-09-01 NOTE — Patient Instructions (Addendum)
 I put the referral in for gynecology Please let us  know if you don't hear in 1-2 weeks to set that up (mychart message or call or letter)   Follow up with orthopedics for the knee   I will send for records [facility]   Take care of yourself

## 2024-09-01 NOTE — Assessment & Plan Note (Addendum)
 Per pt was dx at [facility] clinic  Takes cytomel 5mcg daily and feels better with it   We will send for her thyroid  labs (? What was abn) Here  Lab Results  Component Value Date   TSH 2.14 01/08/2024  Pt was taking thyroid  supplement at that time    Sent for labs Pt will hold med for now in case we need to repeat labs   Of note does have osteopenia  Plan to follow

## 2024-09-01 NOTE — Assessment & Plan Note (Signed)
 Overall doing better Counseling was helpful  Back on sertraline  50 mg daily - helps anxiety and jitteriness Has made some life changes that have helped as well   Cannot exercise as much due to knee pain Encouraged to find other forms of exercise (pedal? Weight training)? Commended self care   Reviewed stressors/ coping techniques/symptoms/ support sources/ tx options and side effects in detail today   Plan to continue sertraline  50 mg  Also trazodone  100 mg for sleep

## 2024-09-19 ENCOUNTER — Encounter: Payer: Self-pay | Admitting: Pharmacist

## 2024-09-19 NOTE — Progress Notes (Signed)
 Pharmacy Quality Measure Review  This patient is appearing on a report for being at risk of failing the adherence measure for cholesterol (statin) medications this calendar year.   Medication: rosuvastatin  5 mg Last fill date: 7/21 for 84 day supply  Contacted pharmacy to facilitate refills. Pharmacy system states this medication is currently ready to pick up. No further action needed at this time.

## 2024-10-07 ENCOUNTER — Telehealth: Payer: Self-pay | Admitting: *Deleted

## 2024-10-07 NOTE — Telephone Encounter (Signed)
-----   Message from [facility] sent at 10/03/2024  7:54 PM EDT ----- I reviewed the notes from [facility]  They were giving patient T3 supplement for sub optimal T3 and T4 per their notes  Her labs however were in the normal range (from what I can see)   In order to continue the thyroid  supplement I think it best she see endocrinology to review  Let me know if open to a referral  Thanks ----- Message ----- From: Nicholaus Pump Sent: 10/03/2024   4:26 PM EDT To: Laine DELENA Balls, MD

## 2024-10-07 NOTE — Telephone Encounter (Signed)
 Left VM requesting pt to call the office back

## 2024-10-10 ENCOUNTER — Encounter: Payer: Self-pay | Admitting: Radiology

## 2024-10-12 NOTE — Telephone Encounter (Signed)
 Spoke w/ Pt- informed of recommendations, Pt declines referral at this time.

## 2024-10-28 ENCOUNTER — Ambulatory Visit: Payer: HMO

## 2024-10-28 VITALS — Ht 64.0 in | Wt 172.0 lb

## 2024-10-28 DIAGNOSIS — Z0000 Encounter for general adult medical examination without abnormal findings: Secondary | ICD-10-CM | POA: Diagnosis not present

## 2024-10-28 NOTE — Progress Notes (Signed)
 Please attest and cosign this visit due to patients primary care provider not being in the office at the time the visit was completed.    Chief Complaint  Patient presents with   Medicare Wellness     Subjective:   Mary Gordon is a 78 y.o. female who presents for a Medicare Annual Wellness Visit.  Allergies (verified) Adhesive [tape], Amoxicillin-pot clavulanate, Paxlovid  [nirmatrelvir -ritonavir ], Macrobid  [nitrofurantoin ], and Simvastatin    History: Past Medical History:  Diagnosis Date   Allergy     all year   Anxiety    Bursitis of hip    Cataract    Cervical cancer (HCC) 1978   Chronic kidney disease    RENAL INSUFF   Depression    Dysrhythmia    PVC'S   Fibromyalgia    GERD (gastroesophageal reflux disease)    High cholesterol    History of Clostridium difficile infection    In past from augmentin   History of hiatal hernia    Osteopenia    Palpitations    Panic disorder    Pulmonary HTN (HCC) 10/20/2014   moderate with PASP   Pulmonary nodule    Being observed   Vitamin D  deficiency    Past Surgical History:  Procedure Laterality Date   ABDOMINAL HYSTERECTOMY  1978   CARDIAC CATHETERIZATION     CATARACT EXTRACTION W/PHACO Right 07/14/2017   Procedure: CATARACT EXTRACTION PHACO AND INTRAOCULAR LENS PLACEMENT (IOC);  Surgeon: Jaye Fallow, MD;  Location: [facility] ORS;  Service: Ophthalmology;  Laterality: Right;  US  00:31 AP% 15.7 CDE 4.97 Fluid pack lot # [id number]   CATARACT EXTRACTION W/PHACO Left 08/04/2017   Procedure: CATARACT EXTRACTION PHACO AND INTRAOCULAR LENS PLACEMENT (IOC);  Surgeon: Jaye Fallow, MD;  Location: [facility] ORS;  Service: Ophthalmology;  Laterality: Left;  US  00:34 AP% 18.7 CDE 6.42 Fluid pack lot # [id number]   COLONOSCOPY  2002   Normal   HAND SURGERY  2005   Left hand thrombosis   NASAL HEMORRHAGE CONTROL  2007   RIGHT HEART CATHETERIZATION N/A 03/16/2015   Procedure: RIGHT HEART CATH;  Surgeon: Ezra GORMAN Shuck, MD;  Location: [facility] CATH LAB;  Service: Cardiovascular;  Laterality: N/A;   SHOULDER SURGERY  2008   Right shoulder bone spur   THYROGLOSSAL DUCT CYST     Family History  Problem Relation Age of Onset   Heart attack Mother 65   Emphysema Mother    Allergies Mother    Asthma Mother    Allergies Father    Glaucoma Father    Arrhythmia Sister        Atrial fibrillation   Cancer Sister        breast   Breast cancer Sister    Emphysema Sister    Heart disease Sister    Rheumatologic disease Sister    Lung cancer Sister    Diabetes Other        Parent   Hyperlipidemia Other        Other relative   Hypertension Other        Parent, other relative   Breast cancer Other        Other relative, sister   Allergies Other        Siblings   Asthma Other        Sisters   Colon cancer Neg Hx    Esophageal cancer Neg Hx    Rectal cancer Neg Hx    Stomach cancer Neg Hx    Social  History   Occupational History   Occupation: Retired from book keeping  Tobacco Use   Smoking status: Former    Current packs/day: 0.00    Average packs/day: 1 pack/day for 17.0 years (17.0 ttl pk-yrs)    Types: Cigarettes    Start date: 12/08/1961    Quit date: 12/08/1978    Years since quitting: 45.9   Smokeless tobacco: Never  Vaping Use   Vaping status: Never Used  Substance and Sexual Activity   Alcohol use: No    Alcohol/week: 0.0 standard drinks of alcohol   Drug use: No   Sexual activity: Yes    Partners: Male   Tobacco Counseling Counseling given: Not Answered  SDOH Screenings   Food Insecurity: No Food Insecurity (10/28/2024)  Housing: Unknown (10/28/2024)  Transportation Needs: No Transportation Needs (10/28/2024)  Utilities: Not At Risk (10/28/2024)  Alcohol Screen: Low Risk  (10/28/2024)  Depression (PHQ2-9): Low Risk  (10/28/2024)  Financial Resource Strain: Low Risk  (10/28/2024)  Physical Activity: Inactive (10/28/2024)  Social Connections: Socially Integrated  (10/28/2024)  Stress: No Stress Concern Present (10/28/2024)  Tobacco Use: Medium Risk (10/28/2024)  Health Literacy: Adequate Health Literacy (10/28/2024)   See flowsheets for full screening details  Depression Screen PHQ 2 & 9 Depression Scale- Over the past 2 weeks, how often have you been bothered by any of the following problems? Little interest or pleasure in doing things: 1 Feeling down, depressed, or hopeless (PHQ Adolescent also includes...irritable): 1 PHQ-2 Total Score: 2 Trouble falling or staying asleep, or sleeping too much: 0 Feeling tired or having little energy: 1 Poor appetite or overeating (PHQ Adolescent also includes...weight loss): 0 Feeling bad about yourself - or that you are a failure or have let yourself or your family down: 0 Trouble concentrating on things, such as reading the newspaper or watching television (PHQ Adolescent also includes...like school work): 0 Moving or speaking so slowly that other people could have noticed. Or the opposite - being so fidgety or restless that you have been moving around a lot more than usual: 0 Thoughts that you would be better off dead, or of hurting yourself in some way: 0 PHQ-9 Total Score: 3 If you checked off any problems, how difficult have these problems made it for you to do your work, take care of things at home, or get along with other people?: Not difficult at all  Depression Treatment Depression Interventions/Treatment : Currently on Treatment; Counseling; Medication     Goals Addressed               This Visit's Progress     I would like to lose weight (in 5lb increments) and do more exercise and organizing things as well eating better        COMPLETED: No current goals (pt-stated)        COMPLETED: Patient Stated        Pt says she has no current goals       Visit info / Clinical Intake: Medicare Wellness Visit Type:: Subsequent Annual Wellness Visit Persons participating in visit::  patient Medicare Wellness Visit Mode:: Telephone If telephone:: video declined Because this visit was a virtual/telehealth visit:: unable to obtan vitals due to lack of equipment If Telephone or Video please confirm:: I discussed the limitations of evaluation and management by telemedicine Patient Location:: home Provider Location:: clinic Information given by:: patient Interpreter Needed?: No Pre-visit prep was completed: yes AWV questionnaire completed by patient prior to visit?: no Living arrangements:: lives  with spouse/significant other Patient's Overall Health Status Rating: good Typical amount of pain: some Does pain affect daily life?: (!) yes Are you currently prescribed opioids?: no  Dietary Habits and Nutritional Risks How many meals a day?: 2 Eats fruit and vegetables daily?: yes Most meals are obtained by: preparing own meals; eating out (mix of both) In the last 2 weeks, have you had any of the following?: none Diabetic:: no  Functional Status Activities of Daily Living (to include ambulation/medication): Independent Ambulation: Independent Medication Administration: Independent Home Management: Independent Manage your own finances?: yes Primary transportation is: driving Concerns about vision?: no *vision screening is required for WTM* Concerns about hearing?: no  Fall Screening Falls in the past year?: 0 Number of falls in past year: 0 Was there an injury with Fall?: 0 Fall Risk Category Calculator: 0 Patient Fall Risk Level: Low Fall Risk  Fall Risk Patient at Risk for Falls Due to: No Fall Risks Fall risk Follow up: Education provided; Falls prevention discussed  Home and Transportation Safety: All rugs have non-skid backing?: yes All stairs or steps have railings?: yes Grab bars in the bathtub or shower?: yes Have non-skid surface in bathtub or shower?: yes Good home lighting?: yes Regular seat belt use?: yes Hospital stays in the last year::  no  Cognitive Assessment Difficulty concentrating, remembering, or making decisions? : no Will 6CIT or Mini Cog be Completed: yes What year is it?: 0 points What month is it?: 0 points Give patient an address phrase to remember (5 components): [address] [place]  About what time is it?: 0 points Count backwards from 20 to 1: 0 points Say the months of the year in reverse: 0 points  Advance Directives (For Healthcare) Does Patient Have a Medical Advance Directive?: Yes Type of Advance Directive: Living will; Healthcare Power of Attorney Copy of Healthcare Power of Attorney in Chart?: No - copy requested Copy of Living Will in Chart?: No - copy requested  Reviewed/Updated  Reviewed/Updated: Reviewed All (Medical, Surgical, Family, Medications, Allergies, Care Teams, Patient Goals)        Objective:    Today's Vitals   10/28/24 1016  Weight: 172 lb (78 kg)  Height: 5' 4 (1.626 m)   Body mass index is 29.52 kg/m.  Current Medications (verified) Outpatient Encounter Medications as of 10/28/2024  Medication Sig   chlorpheniramine (CHLOR-TRIMETON) 4 MG tablet Take 4 mg by mouth 2 (two) times daily as needed for allergies.   cyanocobalamin  1000 MCG tablet Take 1,000 mcg by mouth daily.   famotidine  (PEPCID ) 20 MG tablet Take 1 tablet (20 mg total) by mouth 2 (two) times daily. (Patient taking differently: Take 20 mg by mouth daily.)   liothyronine (CYTOMEL) 5 MCG tablet Take 5 mcg by mouth daily.   NONFORMULARY OR COMPOUNDED ITEM compound testosterone  cream   OVER THE COUNTER MEDICATION Take 5-7 mLs by mouth See admin instructions. Life Tones (uric acid cleanser- has celery extract and other herbs) liquid: Take 5-7 ml's by mouth once a day   pantoprazole  (PROTONIX ) 40 MG tablet Take 1 tablet (40 mg total) by mouth 2 (two) times daily. (Patient taking differently: Take 40 mg by mouth daily.)   progesterone  (PROMETRIUM ) 100 MG capsule Take 100-200 mg by mouth at  bedtime.   rosuvastatin  (CRESTOR ) 5 MG tablet TAKE 1 TABLET BY MOUTH TWICE A WEEK ON MONDAY/THURSDAY   sertraline  (ZOLOFT ) 50 MG tablet Take 1 tablet (50 mg total) by mouth daily.   traZODone  (DESYREL ) 100  MG tablet TAKE 1 TABLET BY MOUTH EVERYDAY AT BEDTIME   No facility-administered encounter medications on file as of 10/28/2024.   Hearing/Vision screen Vision Screening - Comments:: UTD w/visits to Dr Jaye Immunizations and Health Maintenance Health Maintenance  Topic Date Due   Medicare Annual Wellness (AWV)  10/27/2024   COVID-19 Vaccine (5 - 2025-26 season) 09/17/2026 (Originally 08/08/2024)   Mammogram  08/22/2025   DTaP/Tdap/Td (3 - Td or Tdap) 09/20/2030   Pneumococcal Vaccine: 50+ Years  Completed   Influenza Vaccine  Completed   Bone Density Scan  Completed   Hepatitis C Screening  Completed   Meningococcal B Vaccine  Aged Out   Colonoscopy  Discontinued   Zoster Vaccines- Shingrix  Discontinued        Assessment/Plan:  This is a routine wellness examination for [place].  Patient Care Team: [name], Laine LABOR, MD as PCP - General Jaye Fallow, MD as Referring Physician (Ophthalmology) Rolan Ezra RAMAN, MD as Consulting Physician (Cardiology) Starla Harland BROCKS, MD as Consulting Physician (Obstetrics and Gynecology) Carlie Clark, MD as Consulting Physician (Otolaryngology)  I have personally reviewed and noted the following in the patient's chart:   Medical and social history Use of alcohol, tobacco or illicit drugs  Current medications and supplements including opioid prescriptions. Functional ability and status Nutritional status Physical activity Advanced directives List of other physicians Hospitalizations, surgeries, and ER visits in previous 12 months Vitals Screenings to include cognitive, depression, and falls Referrals and appointments  No orders of the defined types were placed in this encounter.  In addition, I have reviewed and discussed with  patient certain preventive protocols, quality metrics, and best practice recommendations. A written personalized care plan for preventive services as well as general preventive health recommendations were provided to patient.   Erminio LITTIE Saris, LPN   [id number]/[id number]   No follow-ups on file.  After Visit Summary: (MyChart) Due to this being a telephonic visit, the after visit summary with patients personalized plan was offered to patient via MyChart   Nurse Notes: Pt has no concerns or questions. AWV/CPE to be made simultaneously for [id number]

## 2024-10-28 NOTE — Patient Instructions (Signed)
 Mary Gordon,  Thank you for taking the time for your Medicare Wellness Visit. I appreciate your continued commitment to your health goals. Please review the care plan we discussed, and feel free to reach out if I can assist you further.  Please note that Annual Wellness Visits do not include a physical exam. Some assessments may be limited, especially if the visit was conducted virtually. If needed, we may recommend an in-person follow-up with your provider.  Ongoing Care Seeing your primary care provider every 3 to 6 months helps us  monitor your health and provide consistent, personalized care.   Referrals If a referral was made during today's visit and you haven't received any updates within two weeks, please contact the referred provider directly to check on the status.  Recommended Screenings:  Health Maintenance  Topic Date Due   Medicare Annual Wellness Visit  10/27/2024   COVID-19 Vaccine (5 - 2025-26 season) 09/17/2026*   Breast Cancer Screening  08/22/2025   DTaP/Tdap/Td vaccine (3 - Td or Tdap) 09/20/2030   Pneumococcal Vaccine for age over 9  Completed   Flu Shot  Completed   Osteoporosis screening with Bone Density Scan  Completed   Hepatitis C Screening  Completed   Meningitis B Vaccine  Aged Out   Colon Cancer Screening  Discontinued   Zoster (Shingles) Vaccine  Discontinued  *Topic was postponed. The date shown is not the original due date.       10/28/2024   10:21 AM  Advanced Directives  Does Patient Have a Medical Advance Directive? Yes  Type of Advance Directive Living will;Healthcare Power of Attorney  Copy of Healthcare Power of Attorney in Chart? No - copy requested    Vision: Annual vision screenings are recommended for early detection of glaucoma, cataracts, and diabetic retinopathy. These exams can also reveal signs of chronic conditions such as diabetes and high blood pressure.  Dental: Annual dental screenings help detect early signs of oral cancer,  gum disease, and other conditions linked to overall health, including heart disease and diabetes.

## 2024-11-26 ENCOUNTER — Other Ambulatory Visit: Payer: Self-pay | Admitting: Family Medicine

## 2024-11-28 NOTE — Telephone Encounter (Signed)
 Last filled on 12/10/23 #90 tab/ 3 refills   CPE scheduled 01/10/24

## 2024-12-19 ENCOUNTER — Encounter: Payer: Self-pay | Admitting: Obstetrics & Gynecology

## 2024-12-19 ENCOUNTER — Ambulatory Visit (INDEPENDENT_AMBULATORY_CARE_PROVIDER_SITE_OTHER): Payer: Self-pay | Admitting: Obstetrics & Gynecology

## 2024-12-19 VITALS — BP 138/76 | HR 96 | Ht 64.0 in | Wt 174.0 lb

## 2024-12-19 DIAGNOSIS — R6882 Decreased libido: Secondary | ICD-10-CM | POA: Diagnosis not present

## 2024-12-19 MED ORDER — PROGESTERONE MICRONIZED 100 MG PO CAPS: 100.0000 mg | ORAL_CAPSULE | Freq: Every day | ORAL | 5 refills | Status: DC

## 2024-12-19 MED ORDER — TESTOSTERONE 1.62 % TD GEL: TRANSDERMAL | 5 refills | Status: AC

## 2024-12-19 NOTE — Progress Notes (Signed)
" ° ° °  GYNECOLOGY PROGRESS NOTE  Subjective:    Patient ID: Mary Gordon, female    DOB: Aug 12, 1946, 79 y.o.   MRN: [id number]  HPI  Patient is a 79 y.o. married G4P1 (87 yo son, 2 grands a 1 great grandkid) here as a new patient to discuss HRT. I saw her for an annual exam 10 years ago at the [facility] office.   She started taking estrogen for about 15years (no uterus). She stopped estrogen around 2017. After that she went nuts with the hot flashes, etc. She then went to [facility] (a hormone store in [place]). She then started taking test. Cream and progesterone  pills. She stopped taking test. When her prescription ran out. Her insurance no longer covers that clinic. She still has and is taking the progesterone .  She would like a refills.  The following portions of the patient's history were reviewed and updated as appropriate: allergies, current medications, past family history, past medical history, past social history, past surgical history, and problem list.  Review of Systems Pertinent items are noted in HPI.   Objective:   Blood pressure 138/76, pulse 96, height 5' 4 (1.626 m), weight 174 lb (78.9 kg). Body mass index is 29.87 kg/m.  Well nourished, well hydrated White female, no apparent distress She is ambulating and conversing normally.  Assessment:   She very much wants a refill of her test. She feels that it helps with her menopausal symptoms. She is aware that this is off label use. She is not convinced that the progesterone  helped anything. She has not taken it for about a month and hasn't noted any worsening of symptoms so she may decide not to take this any longer.  Plan:   "

## 2024-12-25 ENCOUNTER — Telehealth: Payer: Self-pay | Admitting: Family Medicine

## 2024-12-25 DIAGNOSIS — R5383 Other fatigue: Secondary | ICD-10-CM

## 2024-12-25 DIAGNOSIS — E559 Vitamin D deficiency, unspecified: Secondary | ICD-10-CM

## 2024-12-25 DIAGNOSIS — E7800 Pure hypercholesterolemia, unspecified: Secondary | ICD-10-CM

## 2024-12-25 DIAGNOSIS — R7303 Prediabetes: Secondary | ICD-10-CM

## 2024-12-25 DIAGNOSIS — R7989 Other specified abnormal findings of blood chemistry: Secondary | ICD-10-CM

## 2024-12-25 DIAGNOSIS — E039 Hypothyroidism, unspecified: Secondary | ICD-10-CM

## 2024-12-25 DIAGNOSIS — E538 Deficiency of other specified B group vitamins: Secondary | ICD-10-CM

## 2024-12-25 NOTE — Telephone Encounter (Signed)
-----   Message from Veva JINNY Ferrari sent at 12/20/2024  3:12 PM EST ----- Regarding: Lab orders for MON, 1.22.26 Patient is scheduled for CPX labs, please order future labs, Thanks , Veva

## 2024-12-27 ENCOUNTER — Encounter: Admitting: Obstetrics & Gynecology

## 2024-12-29 ENCOUNTER — Other Ambulatory Visit

## 2024-12-29 ENCOUNTER — Ambulatory Visit: Payer: Self-pay | Admitting: Family Medicine

## 2024-12-29 DIAGNOSIS — R5383 Other fatigue: Secondary | ICD-10-CM | POA: Diagnosis not present

## 2024-12-29 DIAGNOSIS — R7303 Prediabetes: Secondary | ICD-10-CM | POA: Diagnosis not present

## 2024-12-29 DIAGNOSIS — E559 Vitamin D deficiency, unspecified: Secondary | ICD-10-CM | POA: Diagnosis not present

## 2024-12-29 DIAGNOSIS — E7800 Pure hypercholesterolemia, unspecified: Secondary | ICD-10-CM

## 2024-12-29 DIAGNOSIS — E538 Deficiency of other specified B group vitamins: Secondary | ICD-10-CM | POA: Diagnosis not present

## 2024-12-29 DIAGNOSIS — E039 Hypothyroidism, unspecified: Secondary | ICD-10-CM

## 2024-12-29 DIAGNOSIS — R7989 Other specified abnormal findings of blood chemistry: Secondary | ICD-10-CM

## 2024-12-29 LAB — CBC WITH DIFFERENTIAL/PLATELET
Basophils Absolute: 0 K/uL (ref 0.0–0.1)
Basophils Relative: 0.5 % (ref 0.0–3.0)
Eosinophils Absolute: 0.2 K/uL (ref 0.0–0.7)
Eosinophils Relative: 3.5 % (ref 0.0–5.0)
HCT: 39.2 % (ref 36.0–46.0)
Hemoglobin: 13.1 g/dL (ref 12.0–15.0)
Lymphocytes Relative: 30.3 % (ref 12.0–46.0)
Lymphs Abs: 1.7 K/uL (ref 0.7–4.0)
MCHC: 33.5 g/dL (ref 30.0–36.0)
MCV: 83.7 fl (ref 78.0–100.0)
Monocytes Absolute: 0.4 K/uL (ref 0.1–1.0)
Monocytes Relative: 7.4 % (ref 3.0–12.0)
Neutro Abs: 3.2 K/uL (ref 1.4–7.7)
Neutrophils Relative %: 58.3 % (ref 43.0–77.0)
Platelets: 287 K/uL (ref 150.0–400.0)
RBC: 4.68 Mil/uL (ref 3.87–5.11)
RDW: 14.4 % (ref 11.5–15.5)
WBC: 5.6 K/uL (ref 4.0–10.5)

## 2024-12-29 LAB — COMPREHENSIVE METABOLIC PANEL WITH GFR
ALT: 18 U/L (ref 3–35)
AST: 20 U/L (ref 5–37)
Albumin: 4.3 g/dL (ref 3.5–5.2)
Alkaline Phosphatase: 108 U/L (ref 39–117)
BUN: 8 mg/dL (ref 6–23)
CO2: 30 meq/L (ref 19–32)
Calcium: 9.3 mg/dL (ref 8.4–10.5)
Chloride: 102 meq/L (ref 96–112)
Creatinine, Ser: 0.89 mg/dL (ref 0.40–1.20)
GFR: 62.16 mL/min
Glucose, Bld: 97 mg/dL (ref 70–99)
Potassium: 4.3 meq/L (ref 3.5–5.1)
Sodium: 139 meq/L (ref 135–145)
Total Bilirubin: 0.8 mg/dL (ref 0.2–1.2)
Total Protein: 6.7 g/dL (ref 6.0–8.3)

## 2024-12-29 LAB — LIPID PANEL
Cholesterol: 207 mg/dL — ABNORMAL HIGH (ref 28–200)
HDL: 58.5 mg/dL
LDL Cholesterol: 119 mg/dL — ABNORMAL HIGH (ref 10–99)
NonHDL: 148.9
Total CHOL/HDL Ratio: 4
Triglycerides: 148 mg/dL (ref 10.0–149.0)
VLDL: 29.6 mg/dL (ref 0.0–40.0)

## 2024-12-29 LAB — TSH: TSH: 2 u[IU]/mL (ref 0.35–5.50)

## 2024-12-29 LAB — VITAMIN B12: Vitamin B-12: 950 pg/mL — ABNORMAL HIGH (ref 211–911)

## 2024-12-29 LAB — VITAMIN D 25 HYDROXY (VIT D DEFICIENCY, FRACTURES): VITD: 71.46 ng/mL (ref 30.00–100.00)

## 2024-12-29 LAB — HEMOGLOBIN A1C: Hgb A1c MFr Bld: 5.4 % (ref 4.6–6.5)

## 2025-01-02 ENCOUNTER — Other Ambulatory Visit

## 2025-01-09 ENCOUNTER — Encounter: Admitting: Family Medicine

## 2025-01-12 ENCOUNTER — Ambulatory Visit: Admitting: Dermatology

## 2025-01-12 ENCOUNTER — Encounter: Payer: Self-pay | Admitting: Dermatology

## 2025-01-12 DIAGNOSIS — D485 Neoplasm of uncertain behavior of skin: Secondary | ICD-10-CM

## 2025-01-12 DIAGNOSIS — Z85828 Personal history of other malignant neoplasm of skin: Secondary | ICD-10-CM

## 2025-01-12 DIAGNOSIS — D492 Neoplasm of unspecified behavior of bone, soft tissue, and skin: Secondary | ICD-10-CM

## 2025-01-12 NOTE — Patient Instructions (Signed)

## 2025-01-12 NOTE — Progress Notes (Signed)
" ° °  New Patient Visit   History of Present Illness Mary Gordon is a 79 year old female with a history of squamous cell carcinoma who presents with a concerning spot on her left leg.  She has a history of squamous cell carcinoma with lesions previously located on the back of her knee and another unspecified location approximately four years ago. There is no history of lesions on her face.  Currently, she has a spot on the left side of her leg that resembles a mole with a black center and is occasionally itchy. The spot has been present for an unspecified duration and has recently flattened, although it was not flat the previous day. She recalls a similar occurrence in the past where the spot flattened and then became raised again. No drainage has been noted from the spot, and it has not been biopsied or treated previously.  Patient states she has spot of concern on her left upper cutaneous lip that comes and goes. It looks like a raised area with a black area in the center. It has been present for 7 months.  Patient reports personal history of SCC on the leg about 3 years ago. Family history of SCC and melanoma.  The following portions of the chart were reviewed this encounter and updated as appropriate: medications, allergies, medical history  Review of Systems:  No other skin or systemic complaints except as noted in HPI or Assessment and Plan.  Objective  Well appearing patient in no apparent distress; mood and affect are within normal limits.  A focused examination was performed of the following areas: face  Relevant exam findings are noted in the Assessment and Plan.     Assessment & Plan   Neoplasm of skin- 2 mm skin colored papule with bluish hue Small bluish lesion, likely vascular. Fluctuates with pressure changes. Low malignancy suspicion. - Photographed lesion for documentation. - Scheduled follow-up in 3-4 months to monitor changes. - Advised to call if significant  growth occurs before follow-up.  HISTORY OF SQUAMOUS CELL CARCINOMA OF THE SKIN - Recommend regular full body skin exams - Recommend daily broad spectrum sunscreen SPF 30+ to sun-exposed areas, reapply every 2 hours as needed.  - Call if any new or changing lesions are noted between office visits    Return for 3-4 month spot check left upper lip; FBSE with Erminio.  LILLETTE Rollene Gobble, RN, am acting as scribe for RUFUS CHRISTELLA HOLY, MD .   Documentation: I have reviewed the above documentation for accuracy and completeness, and I agree with the above.  RUFUS CHRISTELLA HOLY, MD     "

## 2025-01-20 ENCOUNTER — Encounter: Admitting: Family Medicine

## 2025-04-13 ENCOUNTER — Ambulatory Visit: Admitting: Dermatology

## 2025-04-19 ENCOUNTER — Ambulatory Visit: Admitting: Physician Assistant
